# Patient Record
Sex: Male | Born: 1935 | Race: White | Hispanic: No | Smoking: Former smoker
Health system: Southern US, Community
[De-identification: ages and names within clinical notes are randomized; demographics above are authoritative.]

## PROBLEM LIST (undated history)

## (undated) DIAGNOSIS — K429 Umbilical hernia without obstruction or gangrene: Secondary | ICD-10-CM

## (undated) DIAGNOSIS — I10 Essential (primary) hypertension: Secondary | ICD-10-CM

## (undated) DIAGNOSIS — H409 Unspecified glaucoma: Secondary | ICD-10-CM

## (undated) DIAGNOSIS — H9190 Unspecified hearing loss, unspecified ear: Secondary | ICD-10-CM

## (undated) DIAGNOSIS — Z9889 Other specified postprocedural states: Secondary | ICD-10-CM

## (undated) DIAGNOSIS — J189 Pneumonia, unspecified organism: Secondary | ICD-10-CM

## (undated) DIAGNOSIS — I272 Pulmonary hypertension, unspecified: Secondary | ICD-10-CM

## (undated) DIAGNOSIS — I5033 Acute on chronic diastolic (congestive) heart failure: Secondary | ICD-10-CM

## (undated) DIAGNOSIS — E7800 Pure hypercholesterolemia, unspecified: Secondary | ICD-10-CM

## (undated) DIAGNOSIS — H269 Unspecified cataract: Secondary | ICD-10-CM

## (undated) DIAGNOSIS — R9431 Abnormal electrocardiogram [ECG] [EKG]: Secondary | ICD-10-CM

## (undated) DIAGNOSIS — Z8781 Personal history of (healed) traumatic fracture: Secondary | ICD-10-CM

## (undated) DIAGNOSIS — I2510 Atherosclerotic heart disease of native coronary artery without angina pectoris: Secondary | ICD-10-CM

## (undated) DIAGNOSIS — IMO0001 Reserved for inherently not codable concepts without codable children: Secondary | ICD-10-CM

## (undated) DIAGNOSIS — I340 Nonrheumatic mitral (valve) insufficiency: Secondary | ICD-10-CM

## (undated) DIAGNOSIS — I071 Rheumatic tricuspid insufficiency: Secondary | ICD-10-CM

## (undated) DIAGNOSIS — R011 Cardiac murmur, unspecified: Secondary | ICD-10-CM

## (undated) DIAGNOSIS — R21 Rash and other nonspecific skin eruption: Secondary | ICD-10-CM

## (undated) DIAGNOSIS — Z951 Presence of aortocoronary bypass graft: Secondary | ICD-10-CM

## (undated) DIAGNOSIS — I5032 Chronic diastolic (congestive) heart failure: Secondary | ICD-10-CM

## (undated) DIAGNOSIS — I358 Other nonrheumatic aortic valve disorders: Secondary | ICD-10-CM

## (undated) HISTORY — DX: Umbilical hernia without obstruction or gangrene: K42.9

## (undated) HISTORY — DX: Pulmonary hypertension, unspecified: I27.20

## (undated) HISTORY — DX: Chronic diastolic (congestive) heart failure: I50.32

## (undated) HISTORY — DX: Other nonrheumatic aortic valve disorders: I35.8

## (undated) HISTORY — PX: CARDIAC CATHETERIZATION: SHX172

## (undated) HISTORY — DX: Abnormal electrocardiogram (ECG) (EKG): R94.31

## (undated) HISTORY — DX: Unspecified hearing loss, unspecified ear: H91.90

## (undated) HISTORY — PX: TONSILLECTOMY: SUR1361

## (undated) HISTORY — DX: Atherosclerotic heart disease of native coronary artery without angina pectoris: I25.10

## (undated) HISTORY — PX: COLONOSCOPY: SHX174

## (undated) HISTORY — DX: Rheumatic tricuspid insufficiency: I07.1

## (undated) HISTORY — DX: Acute on chronic diastolic (congestive) heart failure: I50.33

## (undated) HISTORY — DX: Pure hypercholesterolemia, unspecified: E78.00

## (undated) HISTORY — DX: Nonrheumatic mitral (valve) insufficiency: I34.0

## (undated) HISTORY — DX: Unspecified cataract: H26.9

## (undated) HISTORY — DX: Personal history of (healed) traumatic fracture: Z87.81

---

## 1965-06-09 DIAGNOSIS — J189 Pneumonia, unspecified organism: Secondary | ICD-10-CM

## 1965-06-09 HISTORY — DX: Pneumonia, unspecified organism: J18.9

## 1969-02-07 HISTORY — PX: FACIAL FRACTURE SURGERY: SHX1570

## 1976-06-09 DIAGNOSIS — Z8781 Personal history of (healed) traumatic fracture: Secondary | ICD-10-CM

## 1976-06-09 HISTORY — DX: Personal history of (healed) traumatic fracture: Z87.81

## 2012-03-01 ENCOUNTER — Encounter (INDEPENDENT_AMBULATORY_CARE_PROVIDER_SITE_OTHER): Payer: Self-pay | Admitting: General Surgery

## 2012-03-02 ENCOUNTER — Ambulatory Visit (INDEPENDENT_AMBULATORY_CARE_PROVIDER_SITE_OTHER): Payer: Medicare Other | Admitting: General Surgery

## 2012-03-03 ENCOUNTER — Encounter (INDEPENDENT_AMBULATORY_CARE_PROVIDER_SITE_OTHER): Payer: Self-pay | Admitting: General Surgery

## 2012-03-03 ENCOUNTER — Ambulatory Visit (INDEPENDENT_AMBULATORY_CARE_PROVIDER_SITE_OTHER): Payer: Medicare Other | Admitting: General Surgery

## 2012-03-03 VITALS — BP 144/84 | HR 67 | Temp 97.9°F | Resp 18 | Ht 72.0 in | Wt 209.8 lb

## 2012-03-03 DIAGNOSIS — M6200 Separation of muscle (nontraumatic), unspecified site: Secondary | ICD-10-CM

## 2012-03-03 DIAGNOSIS — M6208 Separation of muscle (nontraumatic), other site: Secondary | ICD-10-CM

## 2012-03-03 NOTE — Patient Instructions (Signed)
Your exam today reveals no evidence of hernia.  The bulge is caused by a condition called by  diastasis recti.  I am not certain whether this is causing the tightening of your muscles or not.  Use the abdominal binder for 2 or 3 weeks. If this relieves all of your symptoms give me a call and you will be referred to a Engineer, petroleum.  If it does not relieve your symptoms then there must be another cause that we have not identified. You would need to go back to see Dr. Antony Haste at that point.

## 2012-03-03 NOTE — Progress Notes (Addendum)
Patient ID: Trevor Aguilar, male   DOB: 1936/04/11, 76 y.o.   MRN: [id number]  Chief Complaint  Patient presents with  . New Evaluation    hernia    HPI Trevor Aguilar is a 76 y.o. male.  He is referred by Tomi Bamberger, NP  At [facility]  family medicine for evaluation of possible umbilical hernia.  The patient states he has had a bulge in his upper abdomen for about 5 years. It is no larger than it was 5 years ago. He does not have any pain or GI symptoms. He said he states that his muscles tighten up when he leans over from a standing position and he wonders if this bulge is causing that tightness.    He does not have any history of umbilical hernia.    He did have a right inguinal hernia repair many years ago but has no symptoms in the inguinal area. HPI  Past Medical History  Diagnosis Date  . Umbilical hernia   . Nonspecific abnormal electrocardiogram (ECG) (EKG)   . Hearing loss   . Bilateral cataracts     Past Surgical History  Procedure Date  . Tonsillectomy   . Facial fracture surgery     Family History  Problem Relation Age of Onset  . Cancer Mother     breast  . Alcohol abuse Father     Social History History  Substance Use Topics  . Smoking status: Never Smoker   . Smokeless tobacco: Former Neurosurgeon  . Alcohol Use: Yes     2 - 4 glasses of scotch daily    No Known Allergies  Current Outpatient Prescriptions  Medication Sig Dispense Refill  . aspirin 81 MG tablet Take 81 mg by mouth daily.      . calcium-vitamin D (OSCAL) 250-125 MG-UNIT per tablet Take 50,000 tablets by mouth once a week.      . fish oil-omega-3 fatty acids 1000 MG capsule Take 1 g by mouth daily.      . simvastatin (ZOCOR) 20 MG tablet         Review of Systems Review of Systems  Constitutional: Negative for fever, chills and unexpected weight change.  HENT: Negative for hearing loss, congestion, sore throat, trouble swallowing and voice change.   Eyes: Negative for visual disturbance.    Respiratory: Negative for cough and wheezing.   Cardiovascular: Positive for palpitations. Negative for chest pain and leg swelling.  Gastrointestinal: Positive for abdominal distention. Negative for nausea, vomiting, abdominal pain, diarrhea, constipation, blood in stool, anal bleeding and rectal pain.  Genitourinary: Negative for hematuria and difficulty urinating.  Musculoskeletal: Negative for arthralgias.  Skin: Negative for rash and wound.  Neurological: Negative for seizures, syncope, weakness and headaches.  Hematological: Negative for adenopathy. Does not bruise/bleed easily.  Psychiatric/Behavioral: Negative for confusion.    Blood pressure 144/84, pulse 67, temperature 97.9 F (36.6 C), temperature source Temporal, resp. rate 18, height 6' (1.829 m), weight 209 lb 12.8 oz (95.165 kg), SpO2 99.00%.  Physical Exam Physical Exam  Constitutional: He is oriented to person, place, and time. He appears well-developed and well-nourished. No distress.  HENT:  Head: Normocephalic.  Nose: Nose normal.  Mouth/Throat: No oropharyngeal exudate.  Eyes: Conjunctivae normal and EOM are normal. Pupils are equal, round, and reactive to light. Right eye exhibits no discharge. Left eye exhibits no discharge. No scleral icterus.  Neck: Normal range of motion. Neck supple. No JVD present. No tracheal deviation present. No thyromegaly present.  Cardiovascular:  Normal rate, regular rhythm, normal heart sounds and intact distal pulses.   No murmur heard. Pulmonary/Chest: Effort normal and breath sounds normal. No stridor. No respiratory distress. He has no wheezes. He has no rales. He exhibits no tenderness.  Abdominal: Soft. Bowel sounds are normal. He exhibits no distension and no mass. There is no tenderness. There is no rebound and no guarding.       : Diastases recti noted. Bulge when he lifts his head or does a sit up from the supine position. No bulge  in the upper abdomen when standing. The  abdomen is not distended. There is no mass. There is no umbilical or inguinal hernia. There is no tenderness.  Musculoskeletal: Normal range of motion. He exhibits no edema and no tenderness.  Lymphadenopathy:    He has no cervical adenopathy.  Neurological: He is alert and oriented to person, place, and time. He has normal reflexes. Coordination normal.  Skin: Skin is warm and dry. No rash noted. He is not diaphoretic. No erythema. No pallor.  Psychiatric: He has a normal mood and affect. His behavior is normal. Judgment and thought content normal.    Data Reviewed Office notes from [facility] family [facility]  Assessment    Diastases recti.  Clinically there is no evidence of abdominal wall hernia    Plan    He was advised of my clinical impression. I told him that the diastases recti did not require surgical intervention. I told him that I was uncertain as to the cause of his muscle tightness.  I gave him a prescription for a Velcro binder. If this resolves his symptoms then perhaps he should be referred to a plastic surgeon for abdominal wall reconstruction. If it does not resolve his symptoms then they remain unexplained and he should return to Dr. Antony Haste to search for other reasons of abdominal wall tightness or spasm.       Angelia Mould. Derrell Lolling, M.D., [facility] General and Minimally invasive Surgery Breast and Colorectal Surgery Office:   [phone] Pager:   [phone]  03/03/2012, 3:12 PM

## 2012-05-14 HISTORY — PX: EYE SURGERY: SHX253

## 2012-05-25 ENCOUNTER — Encounter (INDEPENDENT_AMBULATORY_CARE_PROVIDER_SITE_OTHER): Payer: Self-pay

## 2012-07-23 ENCOUNTER — Emergency Department (HOSPITAL_COMMUNITY): Payer: Medicare Other

## 2012-07-23 ENCOUNTER — Emergency Department (HOSPITAL_COMMUNITY)
Admission: EM | Admit: 2012-07-23 | Discharge: 2012-07-24 | Disposition: A | Discharge status: Home / Self Care | Payer: Medicare Other | Attending: Emergency Medicine | Admitting: Emergency Medicine

## 2012-07-23 ENCOUNTER — Encounter (HOSPITAL_COMMUNITY): Payer: Self-pay | Admitting: Emergency Medicine

## 2012-07-23 DIAGNOSIS — Z7982 Long term (current) use of aspirin: Secondary | ICD-10-CM | POA: Insufficient documentation

## 2012-07-23 DIAGNOSIS — Z8669 Personal history of other diseases of the nervous system and sense organs: Secondary | ICD-10-CM | POA: Insufficient documentation

## 2012-07-23 DIAGNOSIS — R300 Dysuria: Secondary | ICD-10-CM | POA: Insufficient documentation

## 2012-07-23 DIAGNOSIS — Z8719 Personal history of other diseases of the digestive system: Secondary | ICD-10-CM | POA: Insufficient documentation

## 2012-07-23 DIAGNOSIS — K611 Rectal abscess: Secondary | ICD-10-CM

## 2012-07-23 DIAGNOSIS — R339 Retention of urine, unspecified: Secondary | ICD-10-CM

## 2012-07-23 DIAGNOSIS — E7800 Pure hypercholesterolemia, unspecified: Secondary | ICD-10-CM | POA: Insufficient documentation

## 2012-07-23 DIAGNOSIS — K612 Anorectal abscess: Secondary | ICD-10-CM | POA: Insufficient documentation

## 2012-07-23 DIAGNOSIS — K644 Residual hemorrhoidal skin tags: Secondary | ICD-10-CM | POA: Insufficient documentation

## 2012-07-23 DIAGNOSIS — R350 Frequency of micturition: Secondary | ICD-10-CM | POA: Insufficient documentation

## 2012-07-23 DIAGNOSIS — H269 Unspecified cataract: Secondary | ICD-10-CM | POA: Insufficient documentation

## 2012-07-23 DIAGNOSIS — Z79899 Other long term (current) drug therapy: Secondary | ICD-10-CM | POA: Insufficient documentation

## 2012-07-23 DIAGNOSIS — R197 Diarrhea, unspecified: Secondary | ICD-10-CM | POA: Insufficient documentation

## 2012-07-23 HISTORY — DX: Pure hypercholesterolemia, unspecified: E78.00

## 2012-07-23 LAB — COMPREHENSIVE METABOLIC PANEL
ALT: 47 U/L (ref 0–53)
AST: 34 U/L (ref 0–37)
Albumin: 2.9 g/dL — ABNORMAL LOW (ref 3.5–5.2)
Alkaline Phosphatase: 103 U/L (ref 39–117)
Potassium: 3.7 mEq/L (ref 3.5–5.1)
Sodium: 135 mEq/L (ref 135–145)
Total Protein: 6.8 g/dL (ref 6.0–8.3)

## 2012-07-23 LAB — CBC WITH DIFFERENTIAL/PLATELET
Basophils Absolute: 0 10*3/uL (ref 0.0–0.1)
Basophils Relative: 0 % (ref 0–1)
Eosinophils Absolute: 0.4 10*3/uL (ref 0.0–0.7)
Eosinophils Relative: 2 % (ref 0–5)
HCT: 37.7 % — ABNORMAL LOW (ref 39.0–52.0)
Hemoglobin: 13.2 g/dL (ref 13.0–17.0)
Lymphocytes Relative: 12 % (ref 12–46)
Lymphs Abs: 2 K/uL (ref 0.7–4.0)
MCH: 33.2 pg (ref 26.0–34.0)
MCHC: 35 g/dL (ref 30.0–36.0)
MCV: 94.7 fL (ref 78.0–100.0)
Monocytes Absolute: 2 K/uL — ABNORMAL HIGH (ref 0.1–1.0)
Monocytes Relative: 12 % (ref 3–12)
Neutro Abs: 12.3 K/uL — ABNORMAL HIGH (ref 1.7–7.7)
Neutrophils Relative %: 74 % (ref 43–77)
Platelets: 316 10*3/uL (ref 150–400)
RBC: 3.98 MIL/uL — ABNORMAL LOW (ref 4.22–5.81)
RDW: 12.9 % (ref 11.5–15.5)
WBC: 16.7 K/uL — ABNORMAL HIGH (ref 4.0–10.5)

## 2012-07-23 LAB — URINALYSIS, ROUTINE W REFLEX MICROSCOPIC
Leukocytes, UA: NEGATIVE
Protein, ur: NEGATIVE mg/dL
Urobilinogen, UA: 1 mg/dL (ref 0.0–1.0)

## 2012-07-23 LAB — COMPREHENSIVE METABOLIC PANEL WITH GFR
BUN: 11 mg/dL (ref 6–23)
CO2: 23 meq/L (ref 19–32)
Calcium: 8.9 mg/dL (ref 8.4–10.5)
Chloride: 100 meq/L (ref 96–112)
Creatinine, Ser: 0.82 mg/dL (ref 0.50–1.35)
GFR calc Af Amer: >90 mL/min (ref >90)
GFR calc non Af Amer: 84 mL/min — ABNORMAL LOW (ref >90)
Glucose, Bld: 122 mg/dL — ABNORMAL HIGH (ref 70–99)
Total Bilirubin: 0.8 mg/dL (ref 0.3–1.2)

## 2012-07-23 LAB — OCCULT BLOOD, POC DEVICE: Fecal Occult Bld: POSITIVE — AB

## 2012-07-23 MED ORDER — AMOXICILLIN-POT CLAVULANATE 500-125 MG PO TABS: 1.0000 | ORAL_TABLET | Freq: Three times a day (TID) | ORAL | Status: AC

## 2012-07-23 MED ORDER — IOHEXOL 300 MG/ML  SOLN
20.0000 mL | INTRAMUSCULAR | Status: AC
  Administered 2012-07-23: 50 mL via ORAL

## 2012-07-23 MED ORDER — IOHEXOL 300 MG/ML  SOLN
100.0000 mL | Freq: Once | INTRAMUSCULAR | Status: AC | PRN
  Administered 2012-07-23: 100 mL via INTRAVENOUS

## 2012-07-23 NOTE — ED Notes (Signed)
Pt back in bed w rails up; placed back on spo2 and BP cuff.  Family at bedside.

## 2012-07-23 NOTE — ED Provider Notes (Signed)
I saw and evaluated the patient, reviewed the resident's note and I agree with the findings and plan.  Patient with normal vital signs, afebrile. Abdomen is soft without rebound or guarding. Per resident evaluation, normal rectal tone no obvious objective subtle anesthesia, no obvious prostatitis. Given difficulty with urination and occasionally with possible mild bowel incontinence, will obtain a abdomen and pelvis CT to rule out sigmoid diverticulitis or subtle prostatitis. Patient does have a a mild leukocytosis.  Disposition pending results of CT scan.  Gavin Pound. Idaly Verret, MD 07/25/12 1610

## 2012-07-23 NOTE — ED Notes (Addendum)
Alert, NAD, calm, interactive, resps e/u, speaking in clear complete sentences, (denies: pain, sob or nausea), reports: just went to the b/r and "everything came out ok, no blood"  (BM and urine).

## 2012-07-23 NOTE — ED Notes (Signed)
C/o not being able to control bladder & bowels intermittently with position changes x 4 days. Also reports decreased appetite & having to get up every 10-15 minutes at night to void & only dribbling. States feels like he is not emptying his bladder. Denies n/v/d, last normal BM this morning. Denies dysuria, hematuria, abd pain

## 2012-07-23 NOTE — ED Provider Notes (Signed)
History     CSN: [id number]  Arrival date & time 07/23/12  1414   First MD Initiated Contact with Patient 07/23/12 1708      Chief Complaint  Patient presents with  . Rectal Bleeding    (Consider location/radiation/quality/duration/timing/severity/associated sxs/prior treatment) Patient is a 77 y.o. male presenting with male genitourinary complaint. The history is provided by the patient and the spouse. No language interpreter was used.  Male GU Problem Primary symptoms comment: sensation of incomplete empyting, frequency only at night for 2-3 days. This is a new problem. The current episode started 2 days ago. Episode frequency: nightly. The problem has been resolved. The symptoms occur during urination and during defecation. Associated symptoms include frequency and diarrhea (with several episodes of fecal incontence when changing position). Pertinent negatives include no anorexia, no diaphoresis, no nausea, no vomiting, no abdominal pain, no abdominal swelling and no constipation. There has been no fever. He has tried nothing for the symptoms. The treatment provided no relief. Sexual activity: non-contributory. Partner displays symptoms of an STD: no.    Past Medical History  Diagnosis Date  . Umbilical hernia   . Nonspecific abnormal electrocardiogram (ECG) (EKG)   . Hearing loss   . Bilateral cataracts   . High cholesterol     Past Surgical History  Procedure Laterality Date  . Tonsillectomy    . Facial fracture surgery      Family History  Problem Relation Age of Onset  . Cancer Mother     breast  . Alcohol abuse Father     History  Substance Use Topics  . Smoking status: Never Smoker   . Smokeless tobacco: Former Neurosurgeon  . Alcohol Use: Yes     Comment: 2 - 4 glasses of scotch daily      Review of Systems  Constitutional: Negative for fever, chills, diaphoresis, activity change, appetite change and fatigue.  HENT: Negative for congestion, sore throat, facial  swelling, rhinorrhea, drooling, neck pain and voice change.   Respiratory: Negative for shortness of breath and stridor.   Gastrointestinal: Positive for diarrhea (with several episodes of fecal incontence when changing position). Negative for nausea, vomiting, abdominal pain, constipation, abdominal distention and anorexia.  Endocrine: Negative for polydipsia and polyuria.  Genitourinary: Positive for frequency and difficulty urinating. Negative for urgency, hematuria, flank pain and decreased urine volume.  Musculoskeletal: Negative for back pain and gait problem.  Skin: Negative for color change and wound.  Neurological: Negative for facial asymmetry, weakness, numbness and headaches.  Hematological: Does not bruise/bleed easily.  Psychiatric/Behavioral: Negative for confusion and agitation.    Allergies  Review of patient's allergies indicates no known allergies.  Home Medications   Current Outpatient Rx  Name  Route  Sig  Dispense  Refill  . aspirin 81 MG tablet   Oral   Take 81 mg by mouth daily.         . Brinzolamide-Brimonidine (SIMBRINZA) 1-0.2 % SUSP   Ophthalmic   Apply 1 drop to eye 2 (two) times daily.         . diphenhydrAMINE (BENADRYL) 25 MG tablet   Oral   Take 25 mg by mouth at bedtime as needed for itching or sleep.         . fish oil-omega-3 fatty acids 1000 MG capsule   Oral   Take 1 g by mouth daily.         . naproxen sodium (ANAPROX) 220 MG tablet   Oral   Take 440  mg by mouth at bedtime as needed (for pain).         . simvastatin (ZOCOR) 20 MG tablet   Oral   Take 20 mg by mouth daily.          . Travoprost, BAK Free, (TRAVATAN) 0.004 % SOLN ophthalmic solution   Both Eyes   Place 1 drop into both eyes at bedtime.         Marland Kitchen amoxicillin-clavulanate (AUGMENTIN) 500-125 MG per tablet   Oral   Take 1 tablet (500 mg total) by mouth every 8 (eight) hours.   21 tablet   0     BP 122/67  Pulse 62  Temp(Src) 98.5 F (36.9 C)  (Oral)  Resp 18  SpO2 96%  Physical Exam  Constitutional: He is oriented to person, place, and time. He appears well-developed and well-nourished. No distress.  HENT:  Head: Normocephalic and atraumatic.  Mouth/Throat: No oropharyngeal exudate.  Eyes: Pupils are equal, round, and reactive to light.  Neck: Normal range of motion. Neck supple.  Cardiovascular: Normal rate, regular rhythm and normal heart sounds.  Exam reveals no gallop and no friction rub.   No murmur heard. Pulmonary/Chest: Effort normal and breath sounds normal. No respiratory distress. He has no wheezes. He has no rales.  Abdominal: Soft. Bowel sounds are normal. He exhibits no distension and no mass. There is no tenderness. There is no rebound and no guarding.  Genitourinary: Prostate normal. Rectal exam shows external hemorrhoid. Rectal exam shows no tenderness. Guaiac positive stool. Prostate is not enlarged and not tender.  Musculoskeletal: Normal range of motion. He exhibits no edema and no tenderness.  Neurological: He is alert and oriented to person, place, and time.  Skin: Skin is warm and dry.  Psychiatric: He has a normal mood and affect.    ED Course  Procedures (including critical care time)  Labs Reviewed  CBC WITH DIFFERENTIAL - Abnormal; Notable for the following:    WBC 16.7 (*)    RBC 3.98 (*)    HCT 37.7 (*)    Neutro Abs 12.3 (*)    Monocytes Absolute 2.0 (*)    All other components within normal limits  COMPREHENSIVE METABOLIC PANEL - Abnormal; Notable for the following:    Glucose, Bld 122 (*)    Albumin 2.9 (*)    GFR calc non Af Amer 84 (*)    All other components within normal limits  URINALYSIS, ROUTINE W REFLEX MICROSCOPIC - Abnormal; Notable for the following:    Bilirubin Urine SMALL (*)    All other components within normal limits  OCCULT BLOOD, POC DEVICE - Abnormal; Notable for the following:    Fecal Occult Bld POSITIVE (*)    All other components within normal limits   Ct  Abdomen Pelvis W Contrast  07/23/2012  *RADIOLOGY REPORT*  Clinical Data: Elevated white blood cell count  CT ABDOMEN AND PELVIS WITH CONTRAST  Technique:  Multidetector CT imaging of the abdomen and pelvis was performed following the standard protocol during bolus administration of intravenous contrast.  Contrast: OMNIPAQUE IOHEXOL 300 MG/ML  SOLN  Comparison: None.  Findings: Lung bases:  Scattered tiny nodules are identified within the lung bases.  Index nodule in the left lower lobe measures 6 mm, image 15.  The  Abdomen/pelvis:  Focal area of low attenuation within the left hepatic lobe measures 7 mm, image 21.  Within the right hepatic lobe there is a low attenuation structure measuring 7 mm, image  27. The gallbladder is within normal limits.  There is no biliary dilatation.  The pancreas is unremarkable.  Normal appearance of the spleen.  Bilateral adrenal glands are negative.  Mild bilateral and symmetric perinephric fat stranding.  No renal calculi or hydronephrosis.  No focal liver abnormalities identified. There is moderate distention of the urinary bladder. During the there is diffuse low attenuation within the prostate gland.  The margins of the prostate gland are somewhat indistinct.  Calcified atherosclerotic change affects the abdominal aorta.  There is ectasia of the infrarenal abdominal aorta which measures up to 3 cm in AP dimension, image 38.  No upper abdominal or pelvic adenopathy noted.  No inguinal adenopathy.  The stomach and the small bowel loops have a normal course and caliber.  No evidence for small bowel obstruction.  The appendix is visualized and appears normal.  Multiple sigmoid diverticula identified.  There is no evidence for acute diverticulitis.  There are two perirectal/perianal fluid collections identified within the lower pelvis.  Along the left lateral side of the lower rectum there is a fluid collection measuring 3.7 x 2.6 x 3.0 cm.  More caudally and posteriorly  there is a fluid collection which measures 4.38 x 1.2 x 1.7 cm.  The lateral fluid collection appears to approach the posterior aspect of the prostate gland and may account for the patient's urinary symptoms.  Bones/Musculoskeletal: Multilevel degenerative disc disease noted within the lumbar spine.  There is a hemangioma within L2 vertebra.  IMPRESSION:  1.  There are at least two perirectal fluid collections identified within the lower pelvis which may account for the patient's urinary and bowel symptoms. Findings are concerning for small abscesses.  2.  Moderate distention of the urinary bladder. 3.  Small pulmonary nodules within the lung bases.  The largest measures 6 mm. If the patient is at high risk for bronchogenic carcinoma, follow-up chest CT at 6-12 months is recommended.  If the patient is at low risk for bronchogenic carcinoma, follow-up chest CT at 12 months is recommended.  This recommendation follows the consensus statement: Guidelines for Management of Small Pulmonary Nodules Detected on CT Scans: A Statement from the Fleischner Society as published in Radiology 2005; 237:395-400.   Original Report Authenticated By: Signa Kell, M.D.      1. Perirectal abscess   2. Urinary retention       MDM  6:10PM 1:22 AM Pt is a 77 y.o. male with pertinent PMHX of HLD who presents with intermittent chills about 2 weeks ago, now w/ 2-3 days of sensation of incomplete bladder emptying at night as well as several episodes of small amount of stool incontinence when getting out of bed, in particular.  No known fever in past several day, no dysuria, hematuria, blood in stool.  Has had an painful hemorrhoid without known bleeding. VSS, pt in NAD, afebrile.  Rectal w/ external hemorrhoid, non-tender prostate.  UA does not appear infected.  Pt found to have elevated WBC count and CT ab/pelvis added.   20:05 PM Pt found to have two small perirectal fluid collection in pelvis concerning for abscess  formation as well as distended bladder. Have spoken to general surgery about pt who will review CT, but does not believe pt currently in need of emergent drainage.  PO Augmentin recommended as well as gen surg acute clinic f/u next week.  Have spoken to pt about placing foley for likely urinary retention, but pt feels he is currently completely emptying and does  not want foley.  Will d/c home w/ return precautions for new or worsening symptoms including fever, inability to urinate.   Labs and imaging considered in decision making, reviewed by myself.  Imaging interpreted by radiology. Pt care discussed with my attending, Dr. Oletta Lamas.   1. Perirectal abscess   2. Urinary retention       Toy Cookey, MD 07/24/12 0126

## 2012-07-23 NOTE — ED Notes (Signed)
Pt walked to [facility] w hospital socks on.  Pt walked with stable gait.

## 2012-07-23 NOTE — ED Notes (Signed)
Onset 2-3 weeks ago possible hemorrhoids. Within past week twice sudden onset shivering and having problems urinating and bowel movements. Last two days painful sitting and only urinating a small amount.

## 2012-07-23 NOTE — ED Notes (Signed)
Contrast finished. CT aware. Preparing to place NSL. Pt in b/r.

## 2012-07-23 NOTE — ED Notes (Signed)
Pt back from CT, no changes.

## 2012-07-27 ENCOUNTER — Other Ambulatory Visit (INDEPENDENT_AMBULATORY_CARE_PROVIDER_SITE_OTHER): Payer: Self-pay | Admitting: General Surgery

## 2012-07-27 ENCOUNTER — Encounter (INDEPENDENT_AMBULATORY_CARE_PROVIDER_SITE_OTHER): Payer: Self-pay | Admitting: General Surgery

## 2012-07-27 ENCOUNTER — Ambulatory Visit (INDEPENDENT_AMBULATORY_CARE_PROVIDER_SITE_OTHER): Payer: Medicare Other | Admitting: General Surgery

## 2012-07-27 VITALS — BP 124/80 | HR 60 | Temp 97.2°F | Resp 20 | Ht 74.0 in | Wt 205.0 lb

## 2012-07-27 DIAGNOSIS — K603 Anal fistula: Secondary | ICD-10-CM

## 2012-07-27 DIAGNOSIS — K612 Anorectal abscess: Secondary | ICD-10-CM

## 2012-07-27 DIAGNOSIS — K611 Rectal abscess: Secondary | ICD-10-CM

## 2012-07-27 MED ORDER — METRONIDAZOLE 500 MG PO TABS: 500.0000 mg | ORAL_TABLET | Freq: Three times a day (TID) | ORAL | Status: DC

## 2012-07-27 MED ORDER — CIPROFLOXACIN HCL 500 MG PO TABS: 500.0000 mg | ORAL_TABLET | Freq: Two times a day (BID) | ORAL | Status: AC

## 2012-07-27 NOTE — Patient Instructions (Signed)
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 This may have caused your problems with urination.  Examination today does not reveal any evidence of infection or abscess but I do see the fistula opening externally on the left side  I have called in a 2 week supply of the antibiotics Cipro and Flagyl to your pharmacy and our like you to start taking his antibiotics today and stop taking the Augmentin.  Be sure to drink lots and lots of water.  Return to see [place] in 4 weeks and we will get a blood count at that time. If you get sick with fever or chills or pain call us  immediately.

## 2012-07-27 NOTE — Progress Notes (Signed)
Patient ID: Trevor Aguilar, male   DOB: 05-25-1936, 77 y.o.   MRN: [id number] History: This patient has seen [place] previously for a diastases recti. He is now sent back to [place] for possible rectal abscess.  The patient has no prior history of any rectal disease whatsoever.  2 or 3 weeks ago he had an episode of chills during the [place] Bowl and that went away. He went to the emergency room 5 days ago, February 14 with urinary retention. He is found to have a white count of 16,000. His rectal exam was reportedly normal. He was not having any rectal pain. No chills. No bleeding. No abdominal discomfort. He was having trouble coordinating his urination and stool evacuation.  CT scan showed a 3.7 cm fluid collection on the left lateral wall rectal wall and a 4.8 cm fluid collection posteriorly and more caudally, both which suggested intramural abscesses. He was placed on Augmentin and discharged home.  He's feeling better. No fever or chills. Eating well. Having bowel movements. Voiding but is still a little bit slow. He denies any drainage from his perianal skin  Incidentally he had a recent cardiac workup which was negative. He doesn't know who did this but sounds like [place]  [facility]. He's followed by Antony Haste at [place]  on [address].  Review of systems: 10 system review of systems are negative except as described above  Exam: Patient looks well. No distress. Alert. Neck: No adenopathy or mass Lungs: To auscultation bilaterally Heart: Regular rate and rhythm no murmur no ectopy Abdomen soft and nontender. No mass. No hernia. Diastases recti. No inguinal mass Rectal he has a small opening in the perianal skin on the left side. I was able to pass a probe posteriorly and it went fairly deep did not go superficial at all. There was no cellulitis. I did not see any perianal or perirectal abscess. Digital rectal exam and anoscopy revealed no mass or significant tenderness except some internal  hemorrhoids but I did not see an intramural abscess or feel one anywhere.  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 This may have caused the intramural fluid collections perhaps some type of horseshoe or deep intramural abscess which clinically and symptomatically is resolving. The does not appear to be a indication for urgent surgical intervention 6 mm pulmonary nodules. Followup in 6-12 months is recommended by radiology.  Plan: I discussed the CT findings with him and discussed my impression I will place him on Cipro and Flagyl for 14 days Return to see me in 3-4 weeks at which time we will get a CBC. If he is still symptomatic will get another CT scan. We will reexamine the fistula tract at that time. Followup CT chest in 6-12 months through his primary care physician.   Angelia Mould. Derrell Lolling, M.D., [facility] General and Minimally invasive Surgery Breast and Colorectal Surgery Office:   [phone] Pager:   [phone]

## 2012-08-02 ENCOUNTER — Telehealth (INDEPENDENT_AMBULATORY_CARE_PROVIDER_SITE_OTHER): Payer: Self-pay

## 2012-08-02 LAB — CBC
MCHC: 32.8 g/dL (ref 30.0–36.0)
Platelets: 380 10*3/uL (ref 150–400)
RDW: 13 % (ref 11.5–15.5)

## 2012-08-02 NOTE — Telephone Encounter (Signed)
[facility] [employer] calling in with STAT Lab results.  Lab results forwarded to [facility] to review with Dr. Derrell Lolling.

## 2012-08-23 ENCOUNTER — Encounter (INDEPENDENT_AMBULATORY_CARE_PROVIDER_SITE_OTHER): Payer: Medicare Other | Admitting: General Surgery

## 2014-02-23 ENCOUNTER — Other Ambulatory Visit: Payer: Self-pay | Admitting: Ophthalmology

## 2014-02-23 MED ORDER — TETRACAINE HCL 0.5 % OP SOLN: 1.0000 [drp] | OPHTHALMIC | Status: DC

## 2014-02-23 NOTE — H&P (Signed)
History & Physical:   DATE:   02-21-14  NAME:  Trevor Aguilar, Trevor Aguilar     [id number]       HISTORY OF PRESENT ILLNESS: Chief Eye Complaints glaucoma blurry vision  Va seems to be getting worse.   HPI: EYES: Reports symptoms of   poor appetite diarrhea associated w Neptazane  film over OS this AM upon awakening      LOCATION:   BOTH EYES        QUALITY/COURSE:   Reports condition is changing.        INTENSITY/SEVERITY:    Reports measurement ( or degree) as severe .      DURATION:   Reports the general length of symptoms to be   ONSET/TIMING:   Reports occurrence as   CONTEXT/WHEN:   Reports usually associated with   MODIFIERS/TREATMENTS:  Improved by              PSYCH/Psychiatric:    Is the pt oriented to time,yes place, person? yes Mood normal    ACTIVE PROBLEMS: Glaucoma associated with ocular inflammations   ICD9: 365.62  Onset: 09/08/2013 13:49  ICD10:  IOP improved  Neptazane  qd  Subacute iritis   ICD9: 364.00  Onset: 09/08/2013 14:09   resolved   ICD10:  OU Meibomian gland dysfunction   ICD9: 375.15 Onset: 10/14/2013 09:41 ICD10: H02.89  Dry eye syndrome   ICD9: 375.15  Onset: 09/08/2013 12:40  ICD10: X91.478  Nuclear sclerosis   ICD10:   ICD9: 366.16  Onset: 02/21/2014 12:52  OS   Pseudophakia   ICD9: 446.0 Onset: 09/08/2013 12:39 ICD10: Z96.1 OD    Visual floaters   ICD9: 379.24  Onset: 09/08/2013 12:39  ICD10: G95.621    Discussed no potential for VA improvement OD ,  phaco emulsion cataract extraction OS  SURGERIES: Cataract Surgery Dec 2013  posterior chamber w/  intraocular lens implant  OD  MEDICATIONS: Prolensa: Strength-  SIG-    Ciloxan (Ciprofloxacin) Solution: 0.3% solution SIG-  1 drop in affected eye twice a day   Lotemax: 0.5% gel SIG-  1 drop in OU QD   Simbrinza: 0.2%-1% suspension SIG-  1 gtt in each affected eye 3 times a day for 30 days   Neptazane: 50 mg tablet SIG-  2 tab(s) orally 2 times a day for 30 days   Restasis:  0.05% emulsion SIG-  1 gtt BID OU  REVIEW OF SYSTEMS: ROS:   GEN- Constitutional: Negative general-constitutional systems review.       TOBACCO: Never smoker   ICD9: V13.89 Onset: 09/08/2013 12:33  Tobacco use:     Tobacco cessation:          Smoker Status:   Never smoker   ICD9: V13.89 Onset: 09/08/2013 12:31  SOCIAL HISTORY: Starter Pick List - Social History Married Retired IT trainer  FAMILY HISTORY: Family History - 1st Degree Relatives:  Mother dead.  ALLERGIES: Travoprost:  redness .   Beta blockers, causes decreased heart rate   Trava  PHYSICAL EXAMINATION: VS: BMI: 27.9.  BP: 167/71.  H: 72.00 in.  P: 34 /min.  RR: 20 /min.  W: 205lbs 0oz.  low pulse rate    Va   OD:cc CF ' PH 20/NI OS:cc 20/60  PH   EYEGLASSES: 09/2012 OD:Pl +0.50x052 OS:pl +0.75x150 ADD:+2.50  MR   OD OS ADD  K's OD: 40.50  41.25 OS: 40.50 40.75  VF:   OD:   Constricted OS:    constricted                                             Motility: orthophoria and full  PUPILS:  sluggish reaction each eye  EYELIDS & OCULAR ADNEXA: inferior ectropion + clogged meibomian glands  each eye  SLE: Conjunctiva: +1 injection each eye with slight inferior chemosis  Cornea: irregular tear film with plus to to +2-3 staining OD, +1-2 staining OS Endothelial KP both eyes    anterior chamber  OD, deep & quiet, OS, deep and quiet  Iris: Gray no defects detected each eye  Lens: OD:  posterior chamber  intraocular lens implant with pigment   OS: Plus to to 3 nuclear sclerosis  Vitreous:  CCT:  Ta   in mmHg    OD 32        OS 32 without Simbrinza  Time 02/21/2014 12:15    Gonio: Difficult view angle appears to be open to scleral spur 360 OD OS angle open 360 to scleral spur   Dilation: not dilated secondary to elevated intraocular pressure  Fundus:  optic nerve   OD Temporal rim loss pale                                                  OS Temporal pallor,  90% cupping inferior rim loss   Macula:  OD   flat                                                 OS  Poor view appears to be flat  Vessels:  Periphery:  Computerized visual field test right eye reveals diffuse loss superior and inferior field with extension into fixation using a V stimulus  Left eye reveals an inferior arcuate defect an inferior nasal step.  There is a dense paracentral scotoma near fixation     Labs all  normal      Exam: GENERAL: Appearance: HEAD, EARS, NOSE AND THROAT: Ears-Nose (external) Inspection: Externally, nose and ears are normal in appearance and without scars, lesions, or nodules.      Hearing assessment shows no problems with normal conversation.      LUNGS and RESPIRATORY: Lung auscultation elicits no wheezing, rhonci, rales or rubs and with equal breath sounds.    Respiratory effort described as breathing is unlabored and chest movement is symmetrical.    HEART (Cardiovascular): Heart auscultation discovers regular rate and rhythm; no murmur, gallop or rub. Normal heart sounds.    ABDOMEN (Gastrointestinal): Mass/Tenderness Exam: Neither are present.     MUSCULOSKELETAL (BJE): Inspection-Palpation: No major bone, joint, tendon, or muscle changes.      NEUROLOGICAL: Alert and oriented. No major deficits of coordination or sensation.      PSYCHIATRIC: Insight and judgment appear  both to be intact and appropriate.    Mood and affect are described as normal mood and full affect.    SKIN: Skin Inspection: No rashes or lesions  ADMITTING DIAGNOSIS: Glaucoma associated with ocular  inflammations   ICD9: 365.62  Onset: 09/08/2013 13:49  Initial Date:   IOP improved BUT SEVERE SIDE EFFECTS ON  Neptazane  qd  Subacute iritis   ICD9: 364.00  Onset: 09/08/2013 14:09  Initial Date:   resolved    OU Meibomian gland dysfunction    Dry eye syndrome   ICD9: 375.15  Onset: 09/08/2013 12:40   Nuclear sclerosis   ICD10:   ICD9: 366.16  Onset: 02/21/2014 12:52   Initial Date:   OS   Pseudophakia   ICD9: 446.0 Onset: 09/08/2013 12:39  OD    Visual floaters   ICD9: 379.24  Onset: 09/08/2013 12:39     Discussed no potential for VA improvement OD ,  phaco emulsion cataract extraction OS  SURGICAL TREATMENT PLAN: Simbrinza each eye now Phacoemulsification combined with  trabeculectomy   left eye with mitomycin C  Risk and benefits of surgery have been reviewed with the patient and the patient agrees to proceed with the surgical procedure.   Decreased Neptazane1 pill BID  Actions:     Handouts: glaucoma , what is glaucoma?, What is a cataract?, New Handout, glaucoma treatment, Script Guide - Restasis.    ___________________________ Trevor Aguilar, Jr. Starter - Inactive Problems:    High Cholesterol

## 2014-02-28 ENCOUNTER — Encounter (HOSPITAL_COMMUNITY): Payer: Self-pay | Admitting: *Deleted

## 2014-02-28 ENCOUNTER — Encounter (HOSPITAL_COMMUNITY): Payer: Self-pay | Admitting: Pharmacy Technician

## 2014-02-28 MED ORDER — GATIFLOXACIN 0.5 % OP SOLN
1.0000 [drp] | OPHTHALMIC | Status: AC | PRN
  Administered 2014-03-01 (×3): 1 [drp] via OPHTHALMIC
  Filled 2014-02-28: qty 2.5

## 2014-02-28 MED ORDER — CYCLOPENTOLATE HCL 1 % OP SOLN
1.0000 [drp] | OPHTHALMIC | Status: AC
  Administered 2014-03-01 (×3): 1 [drp] via OPHTHALMIC
  Filled 2014-02-28: qty 2

## 2014-02-28 MED ORDER — KETOROLAC TROMETHAMINE 0.5 % OP SOLN
1.0000 [drp] | OPHTHALMIC | Status: AC
  Administered 2014-03-01 (×3): 1 [drp] via OPHTHALMIC
  Filled 2014-02-28: qty 5

## 2014-02-28 MED ORDER — PHENYLEPHRINE HCL 2.5 % OP SOLN
1.0000 [drp] | OPHTHALMIC | Status: AC
  Administered 2014-03-01 (×3): 1 [drp] via OPHTHALMIC
  Filled 2014-02-28: qty 2

## 2014-03-01 ENCOUNTER — Ambulatory Visit (HOSPITAL_COMMUNITY)
Admission: RE | Admit: 2014-03-01 | Discharge: 2014-03-01 | Disposition: A | Discharge status: Home / Self Care | Payer: Medicare Other | Source: Ambulatory Visit | Attending: Ophthalmology | Admitting: Ophthalmology

## 2014-03-01 ENCOUNTER — Encounter (HOSPITAL_COMMUNITY): Payer: Medicare Other | Admitting: Anesthesiology

## 2014-03-01 ENCOUNTER — Ambulatory Visit (HOSPITAL_COMMUNITY): Payer: Medicare Other | Admitting: Anesthesiology

## 2014-03-01 ENCOUNTER — Encounter (HOSPITAL_COMMUNITY): Payer: Self-pay | Admitting: *Deleted

## 2014-03-01 ENCOUNTER — Encounter (HOSPITAL_COMMUNITY)
Admission: RE | Disposition: A | Discharge status: Home / Self Care | Payer: Self-pay | Source: Ambulatory Visit | Attending: Ophthalmology

## 2014-03-01 DIAGNOSIS — Z961 Presence of intraocular lens: Secondary | ICD-10-CM | POA: Insufficient documentation

## 2014-03-01 DIAGNOSIS — H43399 Other vitreous opacities, unspecified eye: Secondary | ICD-10-CM | POA: Insufficient documentation

## 2014-03-01 DIAGNOSIS — H409 Unspecified glaucoma: Secondary | ICD-10-CM | POA: Insufficient documentation

## 2014-03-01 DIAGNOSIS — H04129 Dry eye syndrome of unspecified lacrimal gland: Secondary | ICD-10-CM | POA: Diagnosis not present

## 2014-03-01 DIAGNOSIS — H2000 Unspecified acute and subacute iridocyclitis: Secondary | ICD-10-CM | POA: Insufficient documentation

## 2014-03-01 DIAGNOSIS — H2510 Age-related nuclear cataract, unspecified eye: Secondary | ICD-10-CM | POA: Insufficient documentation

## 2014-03-01 DIAGNOSIS — H269 Unspecified cataract: Secondary | ICD-10-CM | POA: Diagnosis not present

## 2014-03-01 HISTORY — PX: CATARACT EXTRACTION W/PHACO: SHX586

## 2014-03-01 HISTORY — DX: Unspecified glaucoma: H40.9

## 2014-03-01 HISTORY — PX: TRABECULECTOMY: SHX107

## 2014-03-01 HISTORY — DX: Pneumonia, unspecified organism: J18.9

## 2014-03-01 LAB — CBC
HCT: 47.6 % (ref 39.0–52.0)
Hemoglobin: 15.5 g/dL (ref 13.0–17.0)
MCH: 32.8 pg (ref 26.0–34.0)
MCHC: 32.6 g/dL (ref 30.0–36.0)
MCV: 100.8 fL — AB (ref 78.0–100.0)
Platelets: 232 10*3/uL (ref 150–400)
RBC: 4.72 MIL/uL (ref 4.22–5.81)
RDW: 13.1 % (ref 11.5–15.5)
WBC: 7.7 10*3/uL (ref 4.0–10.5)

## 2014-03-01 SURGERY — TRABECULECTOMY
Anesthesia: Monitor Anesthesia Care | Site: Eye | Laterality: Left

## 2014-03-01 MED ORDER — TOBRAMYCIN 0.3 % OP OINT
TOPICAL_OINTMENT | OPHTHALMIC | Status: DC | PRN
  Administered 2014-03-01: 1 via OPHTHALMIC

## 2014-03-01 MED ORDER — TRIAMCINOLONE ACETONIDE 40 MG/ML IJ SUSP
INTRAMUSCULAR | Status: DC | PRN
  Administered 2014-03-01: .1 mL

## 2014-03-01 MED ORDER — SODIUM HYALURONATE 10 MG/ML IO SOLN
INTRAOCULAR | Status: AC
  Filled 2014-03-01: qty 0.85

## 2014-03-01 MED ORDER — PILOCARPINE HCL 4 % OP SOLN
OPHTHALMIC | Status: AC
  Filled 2014-03-01: qty 15

## 2014-03-01 MED ORDER — NA CHONDROIT SULF-NA HYALURON 40-30 MG/ML IO SOLN
INTRAOCULAR | Status: DC | PRN
  Administered 2014-03-01: 0.5 mL via INTRAOCULAR

## 2014-03-01 MED ORDER — LIDOCAINE HCL (CARDIAC) 20 MG/ML IV SOLN
INTRAVENOUS | Status: DC | PRN
  Administered 2014-03-01: 30 mg via INTRAVENOUS

## 2014-03-01 MED ORDER — TROPICAMIDE 1 % OP SOLN
OPHTHALMIC | Status: AC
  Filled 2014-03-01: qty 3

## 2014-03-01 MED ORDER — SODIUM HYALURONATE 10 MG/ML IO SOLN
INTRAOCULAR | Status: DC | PRN
  Administered 2014-03-01: 0.85 mL via INTRAOCULAR

## 2014-03-01 MED ORDER — PROPOFOL 10 MG/ML IV BOLUS
INTRAVENOUS | Status: AC
  Filled 2014-03-01: qty 20

## 2014-03-01 MED ORDER — LIDOCAINE HCL 2 % IJ SOLN
INTRAMUSCULAR | Status: AC
  Filled 2014-03-01: qty 20

## 2014-03-01 MED ORDER — TROPICAMIDE 1 % OP SOLN
1.0000 [drp] | OPHTHALMIC | Status: AC
  Filled 2014-03-01 (×2): qty 2

## 2014-03-01 MED ORDER — LIDOCAINE-EPINEPHRINE 2 %-1:100000 IJ SOLN
INTRAMUSCULAR | Status: AC
  Filled 2014-03-01: qty 1

## 2014-03-01 MED ORDER — NA CHONDROIT SULF-NA HYALURON 40-30 MG/ML IO SOLN
INTRAOCULAR | Status: AC
  Filled 2014-03-01: qty 0.5

## 2014-03-01 MED ORDER — FENTANYL CITRATE 0.05 MG/ML IJ SOLN: 25.0000 ug | INTRAMUSCULAR | Status: DC | PRN

## 2014-03-01 MED ORDER — FENTANYL CITRATE 0.05 MG/ML IJ SOLN
INTRAMUSCULAR | Status: DC | PRN
  Administered 2014-03-01 (×5): 50 ug via INTRAVENOUS

## 2014-03-01 MED ORDER — BSS IO SOLN
INTRAOCULAR | Status: AC
  Filled 2014-03-01: qty 500

## 2014-03-01 MED ORDER — FLUORESCEIN SODIUM 1 MG OP STRP
ORAL_STRIP | OPHTHALMIC | Status: DC | PRN
  Administered 2014-03-01: 1 via OPHTHALMIC

## 2014-03-01 MED ORDER — TROPICAMIDE 1 % OP SOLN
OPHTHALMIC | Status: DC | PRN
  Administered 2014-03-01 (×3): 1 [drp] via OPHTHALMIC

## 2014-03-01 MED ORDER — MITOMYCIN 0.2 MG OP KIT
0.2000 mg | PACK | OPHTHALMIC | Status: AC
  Administered 2014-03-01: 0.2 mg via OPHTHALMIC
  Filled 2014-03-01: qty 1

## 2014-03-01 MED ORDER — BSS IO SOLN
INTRAOCULAR | Status: DC | PRN
  Administered 2014-03-01: 09:00:00

## 2014-03-01 MED ORDER — FLUORESCEIN SODIUM 1 MG OP STRP
ORAL_STRIP | OPHTHALMIC | Status: AC
  Filled 2014-03-01: qty 1

## 2014-03-01 MED ORDER — 0.9 % SODIUM CHLORIDE (POUR BTL) OPTIME
TOPICAL | Status: DC | PRN
  Administered 2014-03-01: 1000 mL

## 2014-03-01 MED ORDER — PROPOFOL 10 MG/ML IV BOLUS
INTRAVENOUS | Status: DC | PRN
  Administered 2014-03-01: 70 mg via INTRAVENOUS

## 2014-03-01 MED ORDER — LIDOCAINE HCL (CARDIAC) 20 MG/ML IV SOLN
INTRAVENOUS | Status: AC
  Filled 2014-03-01: qty 5

## 2014-03-01 MED ORDER — BUPIVACAINE HCL (PF) 0.75 % IJ SOLN
INTRAMUSCULAR | Status: AC
  Filled 2014-03-01: qty 10

## 2014-03-01 MED ORDER — LACTATED RINGERS IV SOLN
INTRAVENOUS | Status: DC | PRN
  Administered 2014-03-01: 08:00:00 via INTRAVENOUS

## 2014-03-01 MED ORDER — LIDOCAINE-EPINEPHRINE 2 %-1:100000 IJ SOLN
INTRAMUSCULAR | Status: DC | PRN
  Administered 2014-03-01: 09:00:00 via RETROBULBAR

## 2014-03-01 MED ORDER — TETRACAINE HCL 0.5 % OP SOLN
OPHTHALMIC | Status: AC
  Filled 2014-03-01: qty 2

## 2014-03-01 MED ORDER — DROPERIDOL 2.5 MG/ML IJ SOLN
0.6250 mg | INTRAMUSCULAR | Status: DC | PRN
  Filled 2014-03-01: qty 0.25

## 2014-03-01 MED ORDER — DEXAMETHASONE SODIUM PHOSPHATE 10 MG/ML IJ SOLN
INTRAMUSCULAR | Status: AC
  Filled 2014-03-01: qty 1

## 2014-03-01 MED ORDER — FENTANYL CITRATE 0.05 MG/ML IJ SOLN
INTRAMUSCULAR | Status: AC
  Filled 2014-03-01: qty 5

## 2014-03-01 MED ORDER — ACETYLCHOLINE CHLORIDE 1:100 IO SOLR
INTRAOCULAR | Status: AC
Start: 2014-03-01 — End: 2014-03-01
  Filled 2014-03-01: qty 1

## 2014-03-01 MED ORDER — HYALURONIDASE HUMAN 150 UNIT/ML IJ SOLN
INTRAMUSCULAR | Status: AC
  Filled 2014-03-01: qty 1

## 2014-03-01 MED ORDER — EPINEPHRINE HCL 1 MG/ML IJ SOLN
INTRAMUSCULAR | Status: AC
  Filled 2014-03-01: qty 1

## 2014-03-01 MED ORDER — ACETYLCHOLINE CHLORIDE 1:100 IO SOLR
INTRAOCULAR | Status: DC | PRN
  Administered 2014-03-01: 20 mg via INTRAOCULAR

## 2014-03-01 MED ORDER — TOBRAMYCIN-DEXAMETHASONE 0.3-0.1 % OP OINT
TOPICAL_OINTMENT | OPHTHALMIC | Status: AC
  Filled 2014-03-01: qty 3.5

## 2014-03-01 MED ORDER — BSS IO SOLN
INTRAOCULAR | Status: DC | PRN
  Administered 2014-03-01: 500 mL via INTRAOCULAR

## 2014-03-01 MED ORDER — TRIAMCINOLONE ACETONIDE 40 MG/ML IJ SUSP
INTRAMUSCULAR | Status: AC
  Filled 2014-03-01: qty 5

## 2014-03-01 MED ORDER — BSS IO SOLN
INTRAOCULAR | Status: AC
  Filled 2014-03-01: qty 15

## 2014-03-01 MED ORDER — GENTAMICIN SULFATE 40 MG/ML IJ SOLN
INTRAMUSCULAR | Status: AC
  Filled 2014-03-01: qty 2

## 2014-03-01 SURGICAL SUPPLY — 46 items
APPLICATOR COTTON TIP 6IN STRL (MISCELLANEOUS) ×3 IMPLANT
APPLICATOR DR MATTHEWS STRL (MISCELLANEOUS) ×3 IMPLANT
BLADE 10 SAFETY STRL DISP (BLADE) ×3 IMPLANT
BLADE EYE CATARACT 19 1.4 BEAV (BLADE) ×3 IMPLANT
BLADE KERATOME 2.75 (BLADE) ×2 IMPLANT
BLADE KERATOME 2.75MM (BLADE) ×1
BLADE MINI RND TIP GREEN BEAV (BLADE) ×3 IMPLANT
BLADE STAB KNIFE 45DEG (BLADE) ×3 IMPLANT
CANISTER SUCTION 2500CC (MISCELLANEOUS) IMPLANT
CANNULA ANT/CHMB 27GA (MISCELLANEOUS) ×3 IMPLANT
CORDS BIPOLAR (ELECTRODE) ×3 IMPLANT
COVER MAYO STAND STRL (DRAPES) ×3 IMPLANT
DRAPE OPHTHALMIC 40X48 W POUCH (DRAPES) ×3 IMPLANT
DRAPE RETRACTOR (MISCELLANEOUS) ×3 IMPLANT
ERASER HMR WETFIELD 23G BP (MISCELLANEOUS) ×3 IMPLANT
GLOVE BIO SURGEON STRL SZ7 (GLOVE) ×6 IMPLANT
GLOVE BIO SURGEON STRL SZ8 (GLOVE) ×3 IMPLANT
GLOVE BIOGEL PI IND STRL 7.0 (GLOVE) ×1 IMPLANT
GLOVE BIOGEL PI INDICATOR 7.0 (GLOVE) ×2
GLOVE SURG SS PI 7.0 STRL IVOR (GLOVE) ×3 IMPLANT
GOWN STRL REIN XL XLG (GOWN DISPOSABLE) ×9 IMPLANT
KIT BASIN OR (CUSTOM PROCEDURE TRAY) ×3 IMPLANT
KIT ROOM TURNOVER OR (KITS) ×3 IMPLANT
KNIFE GRIESHABER SHARP 2.5MM (MISCELLANEOUS) ×3 IMPLANT
LENS IOL ACRSF IQ PC 20.0 (Intraocular Lens) ×1 IMPLANT
LENS IOL ACRYSOF IQ POST 20.0 (Intraocular Lens) ×3 IMPLANT
NEEDLE 25GX 5/8IN NON SAFETY (NEEDLE) ×6 IMPLANT
NEEDLE FILTER BLUNT 18X 1/2SAF (NEEDLE) ×2
NEEDLE FILTER BLUNT 18X1 1/2 (NEEDLE) ×1 IMPLANT
NEEDLE HYPO 30X.5 LL (NEEDLE) ×6 IMPLANT
NS IRRIG 1000ML POUR BTL (IV SOLUTION) ×3 IMPLANT
PACK CATARACT CUSTOM (CUSTOM PROCEDURE TRAY) ×3 IMPLANT
PAD ARMBOARD 7.5X6 YLW CONV (MISCELLANEOUS) ×6 IMPLANT
PAK PIK CVS CATARACT (OPHTHALMIC) ×3 IMPLANT
SPECIMEN JAR SMALL (MISCELLANEOUS) IMPLANT
SUT ETHILON 10 0 CS140 6 (SUTURE) ×3 IMPLANT
SUT ETHILON 9 0 BV100 4 (SUTURE) ×3 IMPLANT
SUT SILK 6 0 G 6 (SUTURE) ×3 IMPLANT
SUT VICRYL 9-0 (SUTURE) ×3 IMPLANT
SYR 50ML SLIP (SYRINGE) ×3 IMPLANT
SYR TB 1ML LUER SLIP (SYRINGE) ×3 IMPLANT
TIP ABS 45DEG FLARED 0.9MM (TIP) ×3 IMPLANT
TOWEL OR 17X24 6PK STRL BLUE (TOWEL DISPOSABLE) ×6 IMPLANT
TUBE CONNECTING 12'X1/4 (SUCTIONS) ×1
TUBE CONNECTING 12X1/4 (SUCTIONS) ×2 IMPLANT
WIPE INSTRUMENT VISIWIPE 73X73 (MISCELLANEOUS) ×3 IMPLANT

## 2014-03-01 NOTE — Transfer of Care (Signed)
Immediate Anesthesia Transfer of Care Note  Patient: Trevor Aguilar  Procedure(s) Performed: Procedure(s): TRABECULECTOMY (Left) CATARACT EXTRACTION PHACO AND INTRAOCULAR LENS PLACEMENT (IOC) (Left)  Patient Location: PACU  Anesthesia Type:MAC  Level of Consciousness: awake, alert  and oriented  Airway & Oxygen Therapy: Patient Spontanous Breathing  Post-op Assessment: Report given to PACU RN and Post -op Vital signs reviewed and stable  Post vital signs: Reviewed and stable  Complications: No apparent anesthesia complications

## 2014-03-01 NOTE — Op Note (Signed)
Preoperative diagnosis: Uncontrolled glaucoma and visually significant cataract left eye Postoperative diagnosis: Same Procedure: Phacoemulsification with intraocular lens implant and trabeculectomy with mitomycin-C with peripheral iridectomy left eye Anesthesia: 2% Xylocaine in a 50-50 mixture 0.75% Marcaine with ample Wydase with epinephrine Complications: None Assistant: Minglee Procedure: The patient was transported to the operating room where he was given a peribulbar block with the aforementioned local anesthetic agent. All in this the patient's face was prepped and draped in the usual sterile fashion. With the surgeon sitting temporally the operating microscope in position a Weck-Cel sponges used to fixate the globe and a 15 blade was used to enter through inferior temporal clear cornea. Viscoat was injected into the eye chamber it was noted that the pupil was not well dilated with the iris the tube the lens. Weck-Cel sponges used to fixate the globe and a 2.75 mm keratome blade was used in a stepwise fashion through temporal cornea to into the anterior chamber additional Viscoat was injected. Following this a spatula was passed beneath the iris to release adhesions and a Kuglen hook was then used to stretch the iris the pupil then dilated to greater than 6 mm. During the case the pupil did decrease to about 5 mm. Following this a bent 25-gauge needle was used to incise anterior capsule and a continuous tear curvilinear capsulorrhexis was formed the capsule forceps were used to remove the anterior capsule BSS was used to hydrodissect and hydrodelineate the nucleus the nucleus was rotated. The phacoemulsification unit was then used to sculpt the nucleus centrally the iris retracted down the snapper hook was used to push the iris forward and snap the nucleus into 3 quadrants all nuclear fragments were then removed from the eye and the posterior capsule remained intact. I/A was then used to strip cortical  fibers from the posterior capsule after cortical fibers had been removed. This was injected in the anterior chamber. The intraocular lens implant was examined and noted to have no defects the lens was an Alcon AcrySof SN 60 WF IQ lens 20.0 diopter SN #[id number] the lens was injected and unfolded and positioned with a Kuglen hook the subincisional cortex was removed with the I/A. Miochol was injected in the eye a single 10-0 nylon suture was placed to achieve watertight closure. At this point the operating microscope was rotated to the 12:00 position a 6-0 nylon suture was passed through clear cornea to infraducted the eye following this an incision was made in the superior nasal limbus using a Wescott scissors and Hoskins forceps and a fornix-based conjunctival flap was formed bleeding was controlled with cautery. The tenons  fibers were recessed using a Tooke blade. Following this mitomycin-C 0.4 mg per cc was placed onto Gelfoam sponges the sponges were placed beneath the conjunctiva and allowed to stay on the eye for 3 minutes the sponges were then removed and the eye was irrigated with 30 cc of balanced salt solution prior to applying the sponges a 45 blade was used to dissect a half thickness scleral flap. After irrigation the scleral flap was elevated and a 45 blade was used to enter the anterior chamber additional Provisc had been placed and the anterior chamber prior to applying the mitomycin-C. Descemet's punch was used to remove a 1 x 3 block of tissue beneath the scleral flap a basal peripheral iridectomy was formed BSS was injected scleral flap was sutured with 3 interrupted 10-0 nylon sutures following this the conjunctiva was then closed using a 9-0 Vicryl on  a BV 100 needle. BSS was injected in the anterior chamber a fluorescein strip was applied to the incision and there was no leakage noted that the conjunctiva nor from the cornea the anterior chamber was deep the lens implant was in good  position. A subconjunctival injection of Kenalog 4 mg is given in the inferior nasal subconjunctival space. Following this TobraDex ointment was applied to the eye a patch and Fox U. were placed and the patient returned to recovery area in stable condition. Chalmers Guest Junior M.D.

## 2014-03-01 NOTE — Interval H&P Note (Signed)
History and Physical Interval Note:  03/01/2014 8:23 AM  Trevor Aguilar  has presented today for surgery, with the diagnosis of Uncontrolled glaucoma and cataract left eye  The various methods of treatment have been discussed with the patient and family. After consideration of risks, benefits and other options for treatment, the patient has consented to  Procedure(s): TRABECULECTOMY & Cataract Surgery (Left) as a surgical intervention .  The patient's history has been reviewed, patient examined, no change in status, stable for surgery.  I have reviewed the patient's chart and labs.  Questions were answered to the patient's satisfaction.     Briscoe Daniello

## 2014-03-01 NOTE — Anesthesia Postprocedure Evaluation (Signed)
  Anesthesia Post-op Note  Patient: Trevor Aguilar  Procedure(s) Performed: Procedure(s): TRABECULECTOMY (Left) CATARACT EXTRACTION PHACO AND INTRAOCULAR LENS PLACEMENT (IOC) (Left)  Patient Location: PACU  Anesthesia Type:MAC  Level of Consciousness: awake, alert , oriented and patient cooperative  Airway and Oxygen Therapy: Patient Spontanous Breathing  Post-op Pain: none  Post-op Assessment: Post-op Vital signs reviewed, Patient's Cardiovascular Status Stable, Respiratory Function Stable, Patent Airway, No signs of Nausea or vomiting and Pain level controlled  Post-op Vital Signs: Reviewed and stable  Last Vitals:  Filed Vitals:   03/01/14 1115  BP: 151/48  Pulse: 47  Temp:   Resp:     Complications: No apparent anesthesia complications

## 2014-03-01 NOTE — Op Note (Signed)
Preoperative diagnosis: Uncontrolled glaucoma and visually significant cataract left eye Postoperative diagnosis: Same Procedure: Phacoemulsification with intraocular lens implant and trabeculectomy with mitomycin-C with peripheral iridectomy left eye Anesthesia: 2% Xylocaine in a 50-50 mixture 0.75% Marcaine with ample Wydase with epinephrine Complications: None Assistant: Minglee Procedure: The patient was transported to the operating room where he was given a peribulbar block with the aforementioned local anesthetic agent. All in this the patient's face was prepped and draped in the usual sterile fashion. With the surgeon sitting temporally the operating microscope in position a Weck-Cel sponges used to fixate the globe and a 15 blade was used to enter through inferior temporal clear cornea. Viscoat was injected into the eye chamber it was noted that the pupil was not well dilated with the iris the tube the lens. Weck-Cel sponges used to fixate the globe and a 2.75 mm keratome blade was used in a stepwise fashion through temporal cornea to into the anterior chamber additional Viscoat was injected. Following this a spatula was passed beneath the iris to release adhesions and a Kuglen hook was then used to stretch the iris the pupil then dilated to greater than 6 mm. During the case the pupil did decrease to about 5 mm. Following this a bent 25-gauge needle was used to incise anterior capsule and a continuous tear curvilinear capsulorrhexis was formed the capsule forceps were used to remove the anterior capsule BSS was used to hydrodissect and hydrodelineate the nucleus the nucleus was rotated. The phacoemulsification unit was then used to sculpt the nucleus centrally the iris retracted down the snapper hook was used to push the iris forward and snap the nucleus into 3 quadrants all nuclear fragments were then removed from the eye and the posterior capsule remained intact. I/A was then used to strip cortical  fibers from the posterior capsule after cortical fibers had been removed. This was injected in the anterior chamber. The intraocular lens implant was examined and noted to have no defects the lens was an Alcon AcrySof SN 60 WF IQ lens 20.0 diopter SN #[id number] the lens was injected and unfolded and positioned with a Kuglen hook the subincisional cortex was removed with the I/A. Miochol was injected in the eye a single 10-0 nylon suture was placed to achieve watertight closure. At this point the operating microscope was rotated to the 12:00 position a 6-0 nylon suture was passed through clear cornea to infraducted the eye following this an incision was made in the superior nasal limbus using a Wescott scissors and Hoskins forceps and a fornix-based conjunctival flap was formed bleeding was controlled with cautery. The tenons  fibers were recessed using a Tooke blade. Following this mitomycin-C 0.4 mg per cc was placed onto Gelfoam sponges the sponges were placed beneath the conjunctiva and allowed to stay on the eye for 3 minutes the sponges were then removed and the eye was irrigated with 30 cc of balanced salt solution prior to applying the sponges a 45 blade was used to dissect a half thickness scleral flap. After irrigation the scleral flap was elevated and a 45 blade was used to enter the anterior chamber additional Provisc had been placed and the anterior chamber prior to applying the mitomycin-C. Descemet's punch was used to remove a 1 x 3 block of tissue beneath the scleral flap a basal peripheral iridectomy was formed BSS was injected scleral flap was sutured with 3 interrupted 10-0 nylon sutures following this the conjunctiva was then closed using a 9-0 Vicryl on   a BV 100 needle. BSS was injected in the anterior chamber a fluorescein strip was applied to the incision and there was no leakage noted that the conjunctiva nor from the cornea the anterior chamber was deep the lens implant was in good  position. A subconjunctival injection of Kenalog 4 mg is given in the inferior nasal subconjunctival space. Following this TobraDex ointment was applied to the eye a patch and Fox U. were placed and the patient returned to recovery area in stable condition. Chalmers Guest Junior M.D.

## 2014-03-01 NOTE — H&P (View-Only) (Signed)
                  History & Physical:   DATE:   02-21-14  NAME:  Spaugh, Irene R     [id number]       HISTORY OF PRESENT ILLNESS: Chief Eye Complaints glaucoma blurry vision  Va seems to be getting worse.   HPI: EYES: Reports symptoms of   poor appetite diarrhea associated w Neptazane  film over OS this AM upon awakening      LOCATION:   BOTH EYES        QUALITY/COURSE:   Reports condition is changing.        INTENSITY/SEVERITY:    Reports measurement ( or degree) as severe .      DURATION:   Reports the general length of symptoms to be   ONSET/TIMING:   Reports occurrence as   CONTEXT/WHEN:   Reports usually associated with   MODIFIERS/TREATMENTS:  Improved by              PSYCH/Psychiatric:    Is the pt oriented to time,yes place, person? yes Mood normal    ACTIVE PROBLEMS: Glaucoma associated with ocular inflammations   ICD9: 365.62  Onset: 09/08/2013 13:49  ICD10:  IOP improved  Neptazane 200mg qd  Subacute iritis   ICD9: 364.00  Onset: 09/08/2013 14:09   resolved   ICD10:  OU Meibomian gland dysfunction   ICD9: 375.15 Onset: 10/14/2013 09:41 ICD10: H02.89  Dry eye syndrome   ICD9: 375.15  Onset: 09/08/2013 12:40  ICD10: H04.129  Nuclear sclerosis   ICD10:   ICD9: 366.16  Onset: 02/21/2014 12:52  OS   Pseudophakia   ICD9: 446.0 Onset: 09/08/2013 12:39 ICD10: Z96.1 OD    Visual floaters   ICD9: 379.24  Onset: 09/08/2013 12:39  ICD10: H43.399    Discussed no potential for VA improvement OD ,  phaco emulsion cataract extraction OS  SURGERIES: Cataract Surgery Dec 2013  posterior chamber w/  intraocular lens implant  OD  MEDICATIONS: Prolensa: Strength-  SIG-    Ciloxan (Ciprofloxacin) Solution: 0.3% solution SIG-  1 drop in affected eye twice a day   Lotemax: 0.5% gel SIG-  1 drop in OU QD   Simbrinza: 0.2%-1% suspension SIG-  1 gtt in each affected eye 3 times a day for 30 days   Neptazane: 50 mg tablet SIG-  2 tab(s) orally 2 times a day for 30 days   Restasis:  0.05% emulsion SIG-  1 gtt BID OU  REVIEW OF SYSTEMS: ROS:   GEN- Constitutional: Negative general-constitutional systems review.       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  ALLERGIES: Travoprost:  redness .   Beta blockers, causes decreased heart rate   Trava  PHYSICAL EXAMINATION: VS: BMI: 27.9.  BP: 167/71.  H: 72.00 in.  P: 34 /min.  RR: 20 /min.  W: 205lbs 0oz.  low pulse rate    Va   OD:cc CF @4' PH 20/NI OS:cc 20/60  PH   EYEGLASSES: 09/2012 OD:Pl +0.50x052 OS:pl +0.75x150 ADD:+2.50  MR   OD OS ADD  K's OD: 40.50   41.25 OS: 40.50 40.75  VF:   OD:   Constricted OS:    constricted                                             Motility: orthophoria and full  PUPILS:  sluggish reaction each eye  EYELIDS & OCULAR ADNEXA: inferior ectropion + clogged meibomian glands  each eye  SLE: Conjunctiva: +1 injection each eye with slight inferior chemosis  Cornea: irregular tear film with plus to to +2-3 staining OD, +1-2 staining OS Endothelial KP both eyes    anterior chamber  OD, deep & quiet, OS, deep and quiet  Iris: Gray no defects detected each eye  Lens: OD:  posterior chamber  intraocular lens implant with pigment   OS: Plus to to 3 nuclear sclerosis  Vitreous:  CCT:  Ta   in mmHg    OD 32        OS 32 without Simbrinza  Time 02/21/2014 12:15    Gonio: Difficult view angle appears to be open to scleral spur 360 OD OS angle open 360 to scleral spur   Dilation: not dilated secondary to elevated intraocular pressure  Fundus:  optic nerve   OD Temporal rim loss pale                                                  OS Temporal pallor,  90% cupping inferior rim loss   Macula:  OD   flat                                                 OS  Poor view appears to be flat  Vessels:  Periphery:  Computerized visual field test right eye reveals diffuse loss superior and inferior field with extension into fixation using a V stimulus  Left eye reveals an inferior arcuate defect an inferior nasal step.  There is a dense paracentral scotoma near fixation     Labs all  normal      Exam: GENERAL: Appearance: HEAD, EARS, NOSE AND THROAT: Ears-Nose (external) Inspection: Externally, nose and ears are normal in appearance and without scars, lesions, or nodules.      Hearing assessment shows no problems with normal conversation.      LUNGS and RESPIRATORY: Lung auscultation elicits no wheezing, rhonci, rales or rubs and with equal breath sounds.    Respiratory effort described as breathing is unlabored and chest movement is symmetrical.    HEART (Cardiovascular): Heart auscultation discovers regular rate and rhythm; no murmur, gallop or rub. Normal heart sounds.    ABDOMEN (Gastrointestinal): Mass/Tenderness Exam: Neither are present.     MUSCULOSKELETAL (BJE): Inspection-Palpation: No major bone, joint, tendon, or muscle changes.      NEUROLOGICAL: Alert and oriented. No major deficits of coordination or sensation.      PSYCHIATRIC: Insight and judgment appear  both to be intact and appropriate.    Mood and affect are described as normal mood and full affect.    SKIN: Skin Inspection: No rashes or lesions  ADMITTING DIAGNOSIS: Glaucoma associated with ocular   inflammations   ICD9: 365.62  Onset: 09/08/2013 13:49  Initial Date:   IOP improved BUT SEVERE SIDE EFFECTS ON  Neptazane 200mg qd  Subacute iritis   ICD9: 364.00  Onset: 09/08/2013 14:09  Initial Date:   resolved    OU Meibomian gland dysfunction    Dry eye syndrome   ICD9: 375.15  Onset: 09/08/2013 12:40   Nuclear sclerosis   ICD10:   ICD9: 366.16  Onset: 02/21/2014 12:52   Initial Date:   OS   Pseudophakia   ICD9: 446.0 Onset: 09/08/2013 12:39  OD    Visual floaters   ICD9: 379.24  Onset: 09/08/2013 12:39     Discussed no potential for VA improvement OD ,  phaco emulsion cataract extraction OS  SURGICAL TREATMENT PLAN: Simbrinza each eye now Phacoemulsification combined with  trabeculectomy   left eye with mitomycin C  Risk and benefits of surgery have been reviewed with the patient and the patient agrees to proceed with the surgical procedure.   Decreased Neptazane1 pill BID  Actions:     Handouts: glaucoma , what is glaucoma?, What is a cataract?, New Handout, glaucoma treatment, Script Guide - Restasis.    ___________________________ Ladoris Lythgoe, Jr. Starter - Inactive Problems:    High Cholesterol  

## 2014-03-01 NOTE — Anesthesia Preprocedure Evaluation (Signed)
Anesthesia Evaluation  Patient identified by MRN, date of birth, ID band Patient awake    Reviewed: Allergy & Precautions, H&P , NPO status , Patient's Chart, lab work & pertinent test results  History of Anesthesia Complications Negative for: history of anesthetic complications  Airway Mallampati: II TM Distance: >3 FB Neck ROM: Full    Dental  (+) Chipped, Dental Advisory Given   Pulmonary former smoker (quit 1980s),  breath sounds clear to auscultation        Cardiovascular - anginanegative cardio ROS  Rhythm:Regular Rate:Normal  '13 stress: low risk, no ischemia or perfusion abnormalities. EF 66% '13 ECHO: EF 65%, grade 1 diastolic dysfunction, valves OK     Neuro/Psych negative neurological ROS     GI/Hepatic negative GI ROS, Neg liver ROS,   Endo/Other  negative endocrine ROS  Renal/GU negative Renal ROS     Musculoskeletal   Abdominal   Peds  Hematology negative hematology ROS (+)   Anesthesia Other Findings   Reproductive/Obstetrics                           Anesthesia Physical Anesthesia Plan  ASA: II  Anesthesia Plan: MAC   Post-op Pain Management:    Induction: Intravenous  Airway Management Planned: Natural Airway and Simple Face Mask  Additional Equipment:   Intra-op Plan:   Post-operative Plan:   Informed Consent: I have reviewed the patients History and Physical, chart, labs and discussed the procedure including the risks, benefits and alternatives for the proposed anesthesia with the patient or authorized representative who has indicated his/her understanding and acceptance.   Dental advisory given  Plan Discussed with: CRNA and Surgeon  Anesthesia Plan Comments: (Plan routine monitors, MAC)        Anesthesia Quick Evaluation

## 2014-03-01 NOTE — Discharge Instructions (Signed)
What to eat:  For your first meals, you should eat lightly; only small meals initially.  If you do not have nausea, you may eat larger meals.  Avoid spicy, greasy and heavy food.    General Anesthesia, Adult, Care After  Refer to this sheet in the next few weeks. These instructions provide you with information on caring for yourself after your procedure. Your health care provider may also give you more specific instructions. Your treatment has been planned according to current medical practices, but problems sometimes occur. Call your health care provider if you have any problems or questions after your procedure.  WHAT TO EXPECT AFTER THE PROCEDURE  After the procedure, it is typical to experience:  Sleepiness.  Nausea and vomiting. HOME CARE INSTRUCTIONS  For the first 24 hours after general anesthesia:  Have a responsible person with you.  Do not drive a car. If you are alone, do not take public transportation.  Do not drink alcohol.  Do not take medicine that has not been prescribed by your health care provider.  Do not sign important papers or make important decisions.  You may resume a normal diet and activities as directed by your health care provider.  Change bandages (dressings) as directed.  If you have questions or problems that seem related to general anesthesia, call the hospital and ask for the anesthetist or anesthesiologist on call. SEEK MEDICAL CARE IF:  You have nausea and vomiting that continue the day after anesthesia.  You develop a rash. SEEK IMMEDIATE MEDICAL CARE IF:  You have difficulty breathing.  You have chest pain.  You have any allergic problems. Document Released: 09/01/2000 Document Revised: 01/26/2013 Document Reviewed: 12/09/2012  [name] Patient Information 2014 [place], [place].     The patient should keep the eye patch on the eye. It patch becomes uncomfortable later tonight it can be removed high over to not apply any pressure to the eye. The  patient should sleep on his back or the right side avoid heavy lifting bending or straining.

## 2014-03-03 ENCOUNTER — Encounter (HOSPITAL_COMMUNITY): Payer: Self-pay | Admitting: Ophthalmology

## 2014-05-02 DIAGNOSIS — I071 Rheumatic tricuspid insufficiency: Secondary | ICD-10-CM

## 2014-05-02 HISTORY — DX: Rheumatic tricuspid insufficiency: I07.1

## 2014-05-09 DIAGNOSIS — R21 Rash and other nonspecific skin eruption: Secondary | ICD-10-CM

## 2014-05-09 HISTORY — DX: Rash and other nonspecific skin eruption: R21

## 2014-05-10 DIAGNOSIS — I340 Nonrheumatic mitral (valve) insufficiency: Secondary | ICD-10-CM

## 2014-05-10 HISTORY — DX: Nonrheumatic mitral (valve) insufficiency: I34.0

## 2014-05-11 ENCOUNTER — Encounter (HOSPITAL_COMMUNITY): Payer: Self-pay | Admitting: *Deleted

## 2014-05-11 ENCOUNTER — Ambulatory Visit (HOSPITAL_COMMUNITY)
Admission: RE | Admit: 2014-05-11 | Discharge: 2014-05-11 | Disposition: A | Discharge status: Home / Self Care | Payer: Medicare Other | Source: Ambulatory Visit | Attending: Cardiology | Admitting: Cardiology

## 2014-05-11 ENCOUNTER — Encounter (HOSPITAL_COMMUNITY)
Admission: RE | Disposition: A | Discharge status: Home / Self Care | Payer: Self-pay | Source: Ambulatory Visit | Attending: Cardiology

## 2014-05-11 DIAGNOSIS — Q211 Atrial septal defect: Secondary | ICD-10-CM | POA: Insufficient documentation

## 2014-05-11 DIAGNOSIS — I358 Other nonrheumatic aortic valve disorders: Secondary | ICD-10-CM

## 2014-05-11 DIAGNOSIS — I700 Atherosclerosis of aorta: Secondary | ICD-10-CM | POA: Insufficient documentation

## 2014-05-11 DIAGNOSIS — I340 Nonrheumatic mitral (valve) insufficiency: Secondary | ICD-10-CM | POA: Insufficient documentation

## 2014-05-11 HISTORY — DX: Other nonrheumatic aortic valve disorders: I35.8

## 2014-05-11 HISTORY — DX: Essential (primary) hypertension: I10

## 2014-05-11 HISTORY — PX: TEE WITHOUT CARDIOVERSION: SHX5443

## 2014-05-11 SURGERY — ECHOCARDIOGRAM, TRANSESOPHAGEAL
Anesthesia: Moderate Sedation

## 2014-05-11 MED ORDER — SODIUM CHLORIDE 0.9 % IV SOLN
INTRAVENOUS | Status: DC
  Administered 2014-05-11: 500 mL via INTRAVENOUS

## 2014-05-11 MED ORDER — FENTANYL CITRATE 0.05 MG/ML IJ SOLN
INTRAMUSCULAR | Status: DC | PRN
  Administered 2014-05-11: 25 ug via INTRAVENOUS

## 2014-05-11 MED ORDER — DIPHENHYDRAMINE HCL 50 MG/ML IJ SOLN
INTRAMUSCULAR | Status: AC
  Filled 2014-05-11: qty 1

## 2014-05-11 MED ORDER — MIDAZOLAM HCL 10 MG/2ML IJ SOLN
INTRAMUSCULAR | Status: DC | PRN
  Administered 2014-05-11: 2 mg via INTRAVENOUS

## 2014-05-11 MED ORDER — AMIODARONE HCL 400 MG PO TABS: 400.0000 mg | ORAL_TABLET | Freq: Three times a day (TID) | ORAL | Status: DC

## 2014-05-11 MED ORDER — FENTANYL CITRATE 0.05 MG/ML IJ SOLN
INTRAMUSCULAR | Status: AC
  Filled 2014-05-11: qty 2

## 2014-05-11 MED ORDER — BUTAMBEN-TETRACAINE-BENZOCAINE 2-2-14 % EX AERO
INHALATION_SPRAY | CUTANEOUS | Status: DC | PRN
  Administered 2014-05-11: 1 via TOPICAL

## 2014-05-11 MED ORDER — MIDAZOLAM HCL 5 MG/ML IJ SOLN
INTRAMUSCULAR | Status: AC
  Filled 2014-05-11: qty 2

## 2014-05-11 NOTE — Progress Notes (Signed)
  Echocardiogram Echocardiogram Transesophageal has been performed.  Arvil ChacoFoster, Anaisa Radi 05/11/2014, 8:59 AM

## 2014-05-11 NOTE — CV Procedure (Signed)
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 Mild aortic sclerosis. Normal LVEF. PFO with color flor e/o left to right shunt. Severe LA enlargement

## 2014-05-11 NOTE — Discharge Instructions (Signed)
Transesophageal Echocardiogram °Transesophageal echocardiography (TEE) is a picture test of your heart using sound waves. The pictures taken can give very detailed pictures of your heart. This can help your doctor see if there are problems with your heart. TEE can check: °· If your heart has blood clots in it. °· How well your heart valves are working. °· If you have an infection on the inside of your heart. °· Some of the major arteries of your heart. °· If your heart valve is working after a repair. °· Your heart before a procedure that uses a shock to your heart to get the rhythm back to normal. °BEFORE THE PROCEDURE °· Do not eat or drink for 6 hours before the procedure or as told by your doctor. °· Make plans to have someone drive you home after the procedure. Do not drive yourself home. °· An IV tube will be put in your arm. °PROCEDURE °· You will be given a medicine to help you relax (sedative). It will be given through the IV tube. °· A numbing medicine will be sprayed or gargled in the back of your throat to help numb it. °· The tip of the probe is placed into the back of your mouth. You will be asked to swallow. This helps to pass the probe into your esophagus. °· Once the tip of the probe is in the right place, your doctor can take pictures of your heart. °· You may feel pressure at the back of your throat. °AFTER THE PROCEDURE °· You will be taken to a recovery area so the sedative can wear off. °· Your throat may be sore and scratchy. This will go away slowly over time. °· You will go home when you are fully awake and able to swallow liquids. °· You should have someone stay with you for the next 24 hours. °· Do not drive or operate machinery for the next 24 hours. °Document Released: 03/23/2009 Document Revised: 05/31/2013 Document Reviewed: 11/25/2012 °[employer]® Patient Information ©2015 [employer]. This information is not intended to replace advice given to you by your health care provider. Make  sure you discuss any questions you have with your health care provider. ° °

## 2014-05-11 NOTE — H&P (Signed)
  Please see office visit notes for complete details of HPI.  

## 2014-05-11 NOTE — Interval H&P Note (Signed)
History and Physical Interval Note:  05/11/2014 8:22 AM  Trevor Aguilar  has presented today for surgery, with the diagnosis of severe mitral regurgitation  The various methods of treatment have been discussed with the patient and family. After consideration of risks, benefits and other options for treatment, the patient has consented to  Procedure(s): TRANSESOPHAGEAL ECHOCARDIOGRAM (TEE) (N/A) as a surgical intervention .  The patient's history has been reviewed, patient examined, no change in status, stable for surgery.  I have reviewed the patient's chart and labs.  Questions were answered to the patient's satisfaction.     Pamella PertGANJI,JAGADEESH R

## 2014-05-12 ENCOUNTER — Other Ambulatory Visit: Payer: Self-pay

## 2014-05-12 ENCOUNTER — Encounter (HOSPITAL_COMMUNITY): Payer: Self-pay | Admitting: Cardiology

## 2014-05-12 ENCOUNTER — Ambulatory Visit (HOSPITAL_COMMUNITY)
Admission: RE | Admit: 2014-05-12 | Discharge: 2014-05-12 | Disposition: A | Discharge status: Home / Self Care | Payer: Medicare Other | Source: Ambulatory Visit | Attending: Cardiology | Admitting: Cardiology

## 2014-05-12 ENCOUNTER — Encounter (HOSPITAL_COMMUNITY)
Admission: RE | Disposition: A | Discharge status: Home / Self Care | Payer: Medicare Other | Source: Ambulatory Visit | Attending: Cardiology

## 2014-05-12 DIAGNOSIS — I272 Other secondary pulmonary hypertension: Secondary | ICD-10-CM | POA: Insufficient documentation

## 2014-05-12 DIAGNOSIS — I340 Nonrheumatic mitral (valve) insufficiency: Secondary | ICD-10-CM

## 2014-05-12 DIAGNOSIS — Q211 Atrial septal defect: Secondary | ICD-10-CM | POA: Insufficient documentation

## 2014-05-12 DIAGNOSIS — N182 Chronic kidney disease, stage 2 (mild): Secondary | ICD-10-CM | POA: Diagnosis not present

## 2014-05-12 DIAGNOSIS — E785 Hyperlipidemia, unspecified: Secondary | ICD-10-CM | POA: Insufficient documentation

## 2014-05-12 DIAGNOSIS — I2510 Atherosclerotic heart disease of native coronary artery without angina pectoris: Secondary | ICD-10-CM

## 2014-05-12 DIAGNOSIS — E1122 Type 2 diabetes mellitus with diabetic chronic kidney disease: Secondary | ICD-10-CM | POA: Diagnosis not present

## 2014-05-12 DIAGNOSIS — I129 Hypertensive chronic kidney disease with stage 1 through stage 4 chronic kidney disease, or unspecified chronic kidney disease: Secondary | ICD-10-CM | POA: Insufficient documentation

## 2014-05-12 HISTORY — DX: Pulmonary hypertension, unspecified: I27.20

## 2014-05-12 HISTORY — DX: Atherosclerotic heart disease of native coronary artery without angina pectoris: I25.10

## 2014-05-12 HISTORY — PX: LEFT AND RIGHT HEART CATHETERIZATION WITH CORONARY ANGIOGRAM: SHX5449

## 2014-05-12 LAB — BASIC METABOLIC PANEL
Anion gap: 17 — ABNORMAL HIGH (ref 5–15)
BUN: 22 mg/dL (ref 6–23)
CHLORIDE: 106 meq/L (ref 96–112)
CO2: 20 mEq/L (ref 19–32)
Calcium: 9.2 mg/dL (ref 8.4–10.5)
Creatinine, Ser: 1.25 mg/dL (ref 0.50–1.35)
GFR calc Af Amer: 63 mL/min — ABNORMAL LOW (ref >90)
GFR, EST NON AFRICAN AMERICAN: 54 mL/min — AB (ref >90)
GLUCOSE: 91 mg/dL (ref 70–99)
POTASSIUM: 4.3 meq/L (ref 3.7–5.3)
Sodium: 143 mEq/L (ref 137–147)

## 2014-05-12 SURGERY — LEFT AND RIGHT HEART CATHETERIZATION WITH CORONARY ANGIOGRAM
Anesthesia: LOCAL

## 2014-05-12 MED ORDER — MIDAZOLAM HCL 2 MG/2ML IJ SOLN
INTRAMUSCULAR | Status: AC
  Filled 2014-05-12: qty 2

## 2014-05-12 MED ORDER — HEPARIN (PORCINE) IN NACL 2-0.9 UNIT/ML-% IJ SOLN
INTRAMUSCULAR | Status: AC
  Filled 2014-05-12: qty 1500

## 2014-05-12 MED ORDER — SODIUM CHLORIDE 0.9 % IV SOLN: 250.0000 mL | INTRAVENOUS | Status: DC | PRN

## 2014-05-12 MED ORDER — LIDOCAINE HCL (PF) 1 % IJ SOLN
INTRAMUSCULAR | Status: AC
  Filled 2014-05-12: qty 30

## 2014-05-12 MED ORDER — SODIUM CHLORIDE 0.9 % IJ SOLN: 3.0000 mL | Freq: Two times a day (BID) | INTRAMUSCULAR | Status: DC

## 2014-05-12 MED ORDER — ASPIRIN 81 MG PO CHEW: 81.0000 mg | CHEWABLE_TABLET | ORAL | Status: DC

## 2014-05-12 MED ORDER — FENTANYL CITRATE 0.05 MG/ML IJ SOLN
INTRAMUSCULAR | Status: AC
  Filled 2014-05-12: qty 2

## 2014-05-12 MED ORDER — NITROGLYCERIN 1 MG/10 ML FOR IR/CATH LAB
INTRA_ARTERIAL | Status: AC
  Filled 2014-05-12: qty 10

## 2014-05-12 MED ORDER — SODIUM CHLORIDE 0.9 % IV SOLN: INTRAVENOUS | Status: DC

## 2014-05-12 MED ORDER — SODIUM CHLORIDE 0.9 % IJ SOLN: 3.0000 mL | INTRAMUSCULAR | Status: DC | PRN

## 2014-05-12 NOTE — Progress Notes (Signed)
Site area: right groin a 5 french venous sheath and a 7 french venous sheath was removed  Site Prior to Removal:  Level 0  Pressure Applied For 20 MINUTES    Minutes Beginning at 0945am  Manual:   Yes.    Patient Status During Pull:  stable  Post Pull Groin Site:  Level 0  Post Pull Instructions Given:  Yes.    Post Pull Pulses Present:  Yes.    Dressing Applied:  Yes.    Comments:  VS remain stable during sheath pull.  Pt denies any pain at this time.

## 2014-05-12 NOTE — Discharge Instructions (Signed)

## 2014-05-13 LAB — POCT I-STAT 3, ART BLOOD GAS (G3+)
Acid-base deficit: 4 mmol/L — ABNORMAL HIGH (ref 0.0–2.0)
Bicarbonate: 20.3 mEq/L (ref 20.0–24.0)
O2 Saturation: 91 %
PH ART: 7.384 (ref 7.350–7.450)
PO2 ART: 61 mmHg — AB (ref 80.0–100.0)
TCO2: 21 mmol/L (ref 0–100)
pCO2 arterial: 34 mmHg — ABNORMAL LOW (ref 35.0–45.0)

## 2014-05-13 LAB — POCT I-STAT 3, VENOUS BLOOD GAS (G3P V)
Acid-base deficit: 3 mmol/L — ABNORMAL HIGH (ref 0.0–2.0)
Bicarbonate: 22.7 mEq/L (ref 20.0–24.0)
O2 Saturation: 69 %
PH VEN: 7.343 — AB (ref 7.250–7.300)
TCO2: 24 mmol/L (ref 0–100)
pCO2, Ven: 41.8 mmHg — ABNORMAL LOW (ref 45.0–50.0)
pO2, Ven: 38 mmHg (ref 30.0–45.0)

## 2014-05-13 NOTE — CV Procedure (Signed)
Procedures performed: Right and left heart catheterization and occlusion of cardiac output and cardiac index by Fick. Right femoral arterial access and right femoral venous access was utilized for performing the procedure.   Indication: Patient is a 78 year-old Caucasian male with diabetes, hyperlipidemia, hypertension,  Frequent PVCs on the EKG (old), recently was found to have new onset of her murmur.  Physical examination was consistent with severe mitral regurgitation.  He is also symptomatic with class 3-4 shortness of breath.  Outpatient echocardiogram had revealed flail posterior leaflet of the mitral valve and TEE confirmed severe mitral regurgitation.  Hence is brought to the angiography suite to evaluate for pulmonary hypertension and evaluation of cardiac output and cardiac index along with evaluation of coronary artery disease prior to mitral valve repair/replacement.  A 7 French  sheath introduced into right femoral vein access. A 6.5 French Swan-Ganz catheter was advanced with balloon inflated on the sheath under fluoroscopic guidance into first the right atrium followed by the right ventricle and into the pulmonary artery to pulmonary artery wedge position. Hemodynamics were obtained in a locations.  After hemodynamics were completed, samples were taken for SaO2% measurement to be [facility].  The catheter was then pulled back the balloon down and then completely out of the body.   Left Heart Catheterization   First a 5 French right femoral arterial access was obtained in the usual fashion and 5 [place] 4 diagnostic catheter was advanced over standard J-wire into the ascending aorta and used to engage  the Left coronary Artery. Multiple cineangiographic views of the Left then a 5 French Judkins right catheter was utilized to engage the right coronary artery and the same catheter was advanced into the left ventricle for monitoring the hemodynamics. Hemodynamics were  then resampled and the catheter pulled back across the aortic valve for measurement of "pullback" gradient. The catheter was then removed the body over wire. All exchanges were made over standard J wire.   Due to marked elevation in LVEDP, stage II chronic kidney disease, left ventricular gram was not performed as the mitral regurgitation has already been confirmed.  Minimal contrast was utilized, total of 40 cc for the entire procedure.  Patient tolerated the procedure well.  There was no immediate complication.  Procedural data:  RA pressure 17/7  Mean 14 mm mercury.  RV pressure 71/7 and Right ventricular EDP 16 mm Hg. PA pressure 81/34 with a mean of 48  mm mercury. PA saturation 69%. PVR was 213 dynes Pulmonary capillary wedge 10/32 with a mean of 30 mm Hg. Giant V wave was evident on pulmonary capillary wedge tracing. Aortic saturation 91%.  Cardiac output was 6.75 with cardiac index of 3.15  by Fick.   Angiographic data  Left ventricle: not performed due to markedly elevated LVEDP and to conserve contrast due to stage II chronic kidney disease from diabetes mellitus.  Right coronary artery: Smooth,very large, dominant and mild luminal irregularity present.  Left main coronary artery: Normal. No stenosis. Very mild luminal irregularity evident.   LAD: Large, has a high-grade eccentric mildly calcified proximal long segment 80-85% stenosis.  It givesorigin to a moderate sized diagonal 1 which is smooth and normal.   Circumflex coronary artery:  Moderate caliber vessel, mild disease.  Impression: severe pulmonary hypertension due to mitral regurgitation with preserved cardiac output and cardiac index.  Severe single-vessel coronary artery disease involving the proximal LAD.  Outpatient TEE and TTE both are confirmed.  Flail mitral  leaflet, involving the posterior mitral valve. Patient also has a PFO.  Recommendation: Patient has already been referred to Dr. Tressie Stalkerlarence Owen for evaluation  of mitral valve disease.  A total of 40 cc of contrast has been utilized for diagnostic angiography.

## 2014-05-17 ENCOUNTER — Encounter: Payer: Self-pay | Admitting: *Deleted

## 2014-05-17 DIAGNOSIS — S0291XA Unspecified fracture of skull, initial encounter for closed fracture: Secondary | ICD-10-CM | POA: Insufficient documentation

## 2014-05-17 DIAGNOSIS — I493 Ventricular premature depolarization: Secondary | ICD-10-CM | POA: Insufficient documentation

## 2014-05-17 DIAGNOSIS — I739 Peripheral vascular disease, unspecified: Secondary | ICD-10-CM

## 2014-05-18 ENCOUNTER — Institutional Professional Consult (permissible substitution) (INDEPENDENT_AMBULATORY_CARE_PROVIDER_SITE_OTHER): Payer: Medicare Other | Admitting: Thoracic Surgery (Cardiothoracic Vascular Surgery)

## 2014-05-18 ENCOUNTER — Encounter: Payer: Self-pay | Admitting: Thoracic Surgery (Cardiothoracic Vascular Surgery)

## 2014-05-18 VITALS — BP 128/64 | HR 72 | Resp 20 | Ht 72.0 in | Wt 207.0 lb

## 2014-05-18 DIAGNOSIS — I2510 Atherosclerotic heart disease of native coronary artery without angina pectoris: Secondary | ICD-10-CM

## 2014-05-18 DIAGNOSIS — I340 Nonrheumatic mitral (valve) insufficiency: Secondary | ICD-10-CM

## 2014-05-18 DIAGNOSIS — I5032 Chronic diastolic (congestive) heart failure: Secondary | ICD-10-CM

## 2014-05-18 DIAGNOSIS — I5033 Acute on chronic diastolic (congestive) heart failure: Secondary | ICD-10-CM

## 2014-05-18 DIAGNOSIS — I059 Rheumatic mitral valve disease, unspecified: Secondary | ICD-10-CM

## 2014-05-18 HISTORY — DX: Acute on chronic diastolic (congestive) heart failure: I50.33

## 2014-05-18 HISTORY — DX: Chronic diastolic (congestive) heart failure: I50.32

## 2014-05-18 NOTE — Progress Notes (Signed)
[address]       [name] [address]             [phone]     CARDIOTHORACIC SURGERY CONSULTATION REPORT  Referring Provider is Pamella Pert, MD PCP is Eartha Inch, MD  Chief Complaint  Patient presents with  . Mitral Regurgitation    Surgical eval, TEE 05/11/14, Cardiac Cath 05/12/14, ECHO 05/02/14, Carotid duplex 05/16/14    HPI:  Patient is a 78 year old white male from [facility] referred by Dr. Jacinto Halim surgical treatment of severe symptomatic mitral regurgitation.  The patient has history of hypertension, hyperlipidemia, peripheral arterial disease and multi-focal PVCs without any previous history of coronary artery disease, heart murmur or congestive heart failure.  Approximately 2 years ago he saw Dr. Jacinto Halim and underwent a fairly complete cardiac workup to evaluate irregular heart rhythm. He was noted to have frequent PVCs. Transthoracic echocardiogram performed at that time was reportedly normal. The patient additionally had a nuclear stress test at that time that was felt to be low risk. Approximately a year and a half ago the patient began to experience slow functional decline that initially began with severe problems with his eyesight because of cataracts and glaucoma. His physical activity decreased. He began to experience some progressive exertional fatigue and shortness of breath. He was recently seen by a physician assistant at his primary care physician's office and noted to have a loud murmur on physical exam. He was referred back to Dr. Jacinto Halim who performed a transthoracic echocardiogram in the office on 05/02/2014.  By report this echocardiogram demonstrated the presence of mildly depressed left ventricular systolic function with ejection fraction estimated 51%. There was mitral valve prolapse with a flail segment of the posterior leaflet and severe mitral regurgitation. There was moderate tricuspid regurgitation and moderate pulmonary  hypertension.  At that time the patient was started on metoprolol and losartan.  Metoprolol was later discontinued because of acute exacerbation of shortness of breath with leg swelling and leg pain.  Lasix has been added recently for treatment of worsening shortness of breath and lower extremity swelling.  He was recently started on amiodarone.  The patient underwent transesophageal echocardiogram on 05/11/2014. This confirmed the presence of flail segment of the posterior leaflet with severe mitral regurgitation. Left and right heart catheterization was performed 05/12/2014. This was reported to demonstrate severe single-vessel coronary artery disease with long segment 80-85% stenosis of the proximal left anterior descending coronary artery.  There was severe pulmonary hypertension. The patient was referred for surgical consultation.  The patient is married and lives locally in [place] with his wife. 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 The patient states that over the past year and a half's ability to work has decreased precipitously. This initially developed because of poor eyesight. The patient later began to experience exertional fatigue and some shortness of breath. The patient's symptoms of exertional shortness of breath had become acutely worse over the past 2 weeks. He now gets short of breath with minimal activity and occasionally at rest. He cannot lie flat in bed. He has had recent acute exacerbation of lower extremity swelling. He denies any chest pain or chest tightness either with activity or rest.  He has a long history of irregular heart rhythm without palpitations, dizzy spells, or syncope. The patient also has recently had  problems with skin breakdown over his buttocks for which he has been treated with topical creams at his primary care physician's office.  Past Medical History  Diagnosis Date  . Umbilical hernia   . Nonspecific abnormal electrocardiogram (ECG) (EKG)     Pt states he had a cardiac workup and passed all tests (Echo and Stress)  . Hearing loss   . Bilateral cataracts   . High cholesterol   . Glaucoma   . Pneumonia 1967  . Hypertension   . History of skull fracture 1978    lower skull  . Coronary artery disease 05/12/2014    High grade proximal LAD stenosis and moderate RCA stenosis  . Pulmonary hypertension 05/12/2014  . Tricuspid regurgitation 05/02/2014    Moderate TR by transthoracic ECHO  . Mitral regurgitation 05/12/2014    Mitral valve prolapse with flail segment of posterior leaflet and severe mitral regurgitation   . Aortic valve sclerosis 05/11/2014    Aortic valve sclerosis w/out stenosis  . Acute on chronic diastolic congestive heart failure 05/18/2014  . Chronic diastolic congestive heart failure 05/18/2014    Past Surgical History  Procedure Laterality Date  . Tonsillectomy    . Facial fracture surgery  1970's  . Colonoscopy    . Trabeculectomy Left 03/01/2014    Procedure: TRABECULECTOMY;  Surgeon: Chalmers Guest, MD;  Location: [facility] OR;  Service: Ophthalmology;  Laterality: Left;  . Cataract extraction w/phaco Left 03/01/2014    Procedure: CATARACT EXTRACTION PHACO AND INTRAOCULAR LENS PLACEMENT (IOC);  Surgeon: Chalmers Guest, MD;  Location: [facility] OR;  Service: Ophthalmology;  Laterality: Left;  . Tee without cardioversion N/A 05/11/2014    Procedure: TRANSESOPHAGEAL ECHOCARDIOGRAM (TEE);  Surgeon: Pamella Pert, MD;  Location: [facility] ENDOSCOPY;  Service: Cardiovascular;  Laterality: N/A;  . Eye surgery Right 05/14/12    cataract removal with lens implant  . Left and right heart catheterization with coronary angiogram N/A 05/12/2014    Procedure: LEFT AND RIGHT HEART  CATHETERIZATION WITH CORONARY ANGIOGRAM;  Surgeon: Pamella Pert, MD;  Location: [facility] CATH LAB;  Service: Cardiovascular;  Laterality: N/A;    Family History  Problem Relation Age of Onset  . Cancer Mother     breast  . Alcohol abuse Father   . Cataracts Father   . Heart disease Father     angina  . Arthritis Sister     History   Social History  . Marital Status: Married    Spouse Name: N/A    Number of Children: N/A  . Years of Education: N/A   Occupational History  . Not on file.   Social History Main Topics  . Smoking status: Former Smoker    Quit date: 03/01/1983  . Smokeless tobacco: Never Used  . Alcohol Use: 10.5 oz/week    21 Not specified per week     Comment: 2 - 4 glasses of scotch daily  . Drug Use: No  . Sexual Activity: Not on file   Other Topics Concern  . Not on file   Social History Narrative    Current Outpatient Prescriptions  Medication Sig Dispense Refill  . amiodarone (PACERONE) 400 MG tablet Take 1 tablet (400 mg total) by mouth 3 (three) times daily. 90 tablet 0  . aspirin 81 MG tablet Take 81 mg by mouth daily.    . Brinzolamide-Brimonidine (SIMBRINZA) 1-0.2 % SUSP  Place 1 drop into the right eye 3 (three) times daily.     . chlorthalidone (HYGROTON) 25 MG tablet Take 12.5 mg by mouth daily.    . diclofenac (VOLTAREN) 75 MG EC tablet Take 75 mg by mouth 2 (two) times daily.    . Difluprednate (DUREZOL) 0.05 % EMUL Place 1 drop into the left eye 3 (three) times daily.    . fish oil-omega-3 fatty acids 1000 MG capsule Take 1 g by mouth daily.    . furosemide (LASIX) 40 MG tablet Take 40 mg by mouth 2 (two) times daily. For 3 days    . ofloxacin (OCUFLOX) 0.3 % ophthalmic solution Place 1 drop into the right eye 4 (four) times daily.    . potassium chloride (K-DUR) 10 MEQ tablet Take 10 mEq by mouth daily.    . simvastatin (ZOCOR) 20 MG tablet Take 20 mg by mouth daily.     Marland Kitchen tiZANidine (ZANAFLEX) 4 MG tablet Take 4 mg by mouth at bedtime.     . Vitamin D, Ergocalciferol, (DRISDOL) 50000 UNITS CAPS capsule Take 50,000 Units by mouth every 7 (seven) days. Monday     No current facility-administered medications for this visit.    Allergies  Allergen Reactions  . Metoprolol Tartrate Shortness Of Breath    ALSO HAD LEG PAIN  . Restoril [Temazepam] Other (See Comments)    BAD DREAMS      Review of Systems:   General:  decreased appetite, decreased energy, no weight gain, no weight loss, no fever  Cardiac:  no chest pain with exertion, no chest pain at rest, + SOB with minimal exertion, occasional resting SOB, no PND, + orthopnea, no palpitations, + arrhythmia, no atrial fibrillation, + LE edema, no dizzy spells, no syncope  Respiratory:  + shortness of breath, no home oxygen, no productive cough, no dry cough, no bronchitis, no wheezing, no hemoptysis, no asthma, no pain with inspiration or cough, no sleep apnea, no CPAP at night  GI:   no difficulty swallowing, no reflux, no frequent heartburn, no hiatal hernia, no abdominal pain, no constipation, no diarrhea, no hematochezia, no hematemesis, no melena  GU:   no dysuria,  + frequency, no urinary tract infection, no hematuria, no enlarged prostate, no kidney stones, no kidney disease  Vascular:  no pain suggestive of claudication, no pain in feet, no leg cramps, no varicose veins, no DVT, no non-healing foot ulcer  Neuro:   no stroke, no TIA's, no seizures, no headaches, no temporary blindness one eye,  no slurred speech, no peripheral neuropathy, no chronic pain, no instability of gait, denies memory/cognitive dysfunction  Musculoskeletal: no arthritis, no joint swelling, no myalgias, no difficulty walking, normal mobility   Skin:   + rash, no itching, no skin infections, + pressure sores or ulcerations on buttocks  Psych:   no anxiety, no depression, no nervousness, + unusual recent stress  Eyes:   + blurry vision, no floaters, + recent vision changes, no wears glasses or  contacts  ENT:   + hearing loss, no loose or painful teeth, + partial upper dentures, last saw dentist June 2015  Hematologic:  no easy bruising, no abnormal bleeding, no clotting disorder, no frequent epistaxis  Endocrine:  no diabetes, does not check CBG's at home     Physical Exam:   BP 128/64 mmHg  Pulse 72  Resp 20  Ht 6' (1.829 m)  Wt 207 lb (93.895 kg)  BMI 28.07 kg/m2  SpO2 96%  General:  Moderately obese, well-appearing  HEENT:  Unremarkable   Neck:   no JVD, no bruits, no adenopathy   Chest:   clear to auscultation, symmetrical breath sounds, no wheezes, no rhonchi   CV:   Irregular rate and rhythm, prominent holosystolic murmur   Abdomen:  soft, non-tender, no masses   Extremities:  warm, well-perfused, pulses diminished, 3+ bilateral LE edema  Rectal/GU  Deferred  Neuro:   Grossly non-focal and symmetrical throughout  Skin:   Clean and dry, + breakdown on both gluteal surfaces c/w pressure sores - no cellulitis   Diagnostic Tests:  TRANSTHORACIC ECHOCARDIOGRAM  Report from transthoracic echocardiogram performed 05/02/2014 is reviewed.  Images are not currently available for review. By report there was mild left ventricular systolic dysfunction with ejection fraction calculated 51%. There was mild-to-moderate left atrial enlargement. There was mild-to-moderate right atrial enlargement. There was mild right ventricular enlargement with normal right ventricular systolic function. There was flail posterior mitral leaflet with severe mitral regurgitation and mild mitral annulus calcification. There was moderate tricuspid regurgitation. There was moderate pulmonary hypertension.    TRANSESOPHAGEAL ECHOCARDIOGRAM  Severe MR due to flail posterior MV leaflet. Mild aortic sclerosis. Normal LVEF. PFO with color flor e/o left to right shunt. Severe LA enlargement        CARDIAC CATHETERIZATION  Procedures performed: Right and left heart catheterization and occlusion of  cardiac output and cardiac index by Fick. Right femoral arterial access and right femoral venous access was utilized for performing the procedure.   Indication: Patient is a 78 year-old Caucasian male with diabetes, hyperlipidemia, hypertension, Frequent PVCs on the EKG (old), recently was found to have new onset of her murmur. Physical examination was consistent with severe mitral regurgitation. He is also symptomatic with class 3-4 shortness of breath. Outpatient echocardiogram had revealed flail posterior leaflet of the mitral valve and TEE confirmed severe mitral regurgitation. Hence is brought to the angiography suite to evaluate for pulmonary hypertension and evaluation of cardiac output and cardiac index along with evaluation of coronary artery disease prior to mitral valve repair/replacement.  A 7 French sheath introduced into right femoral vein access. A 6.5 French Swan-Ganz catheter was advanced with balloon inflated on the sheath under fluoroscopic guidance into first the right atrium followed by the right ventricle and into the pulmonary artery to pulmonary artery wedge position. Hemodynamics were obtained in a locations.  After hemodynamics were completed, samples were taken for SaO2% measurement to be used in [facility]Index catheterization.  The catheter was then pulled back the balloon down and then completely out of the body.   Left Heart Catheterization   First a 5 French right femoral arterial access was obtained in the usual fashion and 5 [place] FL 4 diagnostic catheter was advanced over standard J-wire into the ascending aorta and used to engage the Left coronary Artery. Multiple cineangiographic views of the Left then a 5 French Judkins right catheter was utilized to engage the right coronary artery and the same catheter was advanced into the left ventricle for monitoring the hemodynamics. Hemodynamics were then resampled and the catheter pulled back across the aortic valve for  measurement of "pullback" gradient. The catheter was then removed the body over wire. All exchanges were made over standard J wire.   Due to marked elevation in LVEDP, stage II chronic kidney disease, left ventricular gram was not performed as the mitral regurgitation has already been confirmed. Minimal contrast was utilized, total of 40 cc for the entire procedure.  Patient tolerated the procedure well. There was no immediate complication.  Procedural data:  RA pressure 17/7 Mean 14 mm mercury.  RV pressure 71/7 and Right ventricular EDP 16 mm Hg. PA pressure 81/34 with a mean of 48 mm mercury. PA saturation 69%. PVR was 213 dynes Pulmonary capillary wedge 10/32 with a mean of 30 mm Hg. Giant V wave was evident on pulmonary capillary wedge tracing. Aortic saturation 91%.  Cardiac output was 6.75 with cardiac index of 3.15 by Fick.   Angiographic data  Left ventricle: not performed due to markedly elevated LVEDP and to conserve contrast due to stage II chronic kidney disease from diabetes mellitus.  Right coronary artery: Smooth,very large, dominant and mild luminal irregularity present.  Left main coronary artery: Normal. No stenosis. Very mild luminal irregularity evident.  LAD: Large, has a high-grade eccentric mildly calcified proximal long segment 80-85% stenosis. It givesorigin to a moderate sized diagonal 1 which is smooth and normal.   Circumflex coronary artery: Moderate caliber vessel, mild disease.  Impression: severe pulmonary hypertension due to mitral regurgitation with preserved cardiac output and cardiac index. Severe single-vessel coronary artery disease involving the proximal LAD.  Outpatient TEE and TTE both are confirmed. Flail mitral leaflet, involving the posterior mitral valve. Patient also has a PFO.  Recommendation: Patient has already been referred to Dr. Tressie Stalker for evaluation of mitral valve disease. A total of 40 cc of contrast has been  utilized for diagnostic angiography.           Impression:  The patient has stage D severe symptomatic primary mitral regurgitation with mild left ventricular systolic dysfunction, moderate to severe pulmonary hypertension, moderate tricuspid regurgitation, and two-vessel coronary artery disease. He presents with acute exacerbation of chronic diastolic congestive heart failure, which over the past 2 weeks has been at least New York Heart Association functional class III.  I have personally reviewed the patient's recent transesophageal echocardiogram and diagnostic cardiac catheterization. The patient has degenerative disease of the mitral valve with severe prolapse involving a flail segment of the posterior leaflet with severe mitral regurgitation. There is significant annular calcification. Left ventricular systolic function is mildly reduced. There is moderate tricuspid regurgitation. Catheterization is notable for long segment tubular 80-85% stenosis of the proximal left anterior descending coronary artery. There is also significant disease in the right coronary artery, particularly beyond the bifurcation takeoff of the posterior descending coronary artery. I agree the patient should be treated with elective mitral valve repair and coronary artery bypass grafting.   Plan:  The patient and his wife were counseled at length regarding the indications, risks and potential benefits of mitral valve repair and coronary artery bypass grafting.  The rationale for elective surgery has been explained, including a comparison between surgery and continued medical therapy with close follow-up.  The likelihood of successful and durable valve repair has been discussed with particular reference to the findings of their recent echocardiogram.  Based upon these findings and previous experience, I have quoted them a greater than 90 percent likelihood of successful valve repair.  In the unlikely event that their valve  cannot be successfully repaired, we discussed the possibility of replacing the mitral valve using a mechanical prosthesis with the attendant need for long-term anticoagulation versus the alternative of replacing it using a bioprosthetic tissue valve with its potential for late structural valve deterioration and failure, depending upon the patient's longevity.  The patient specifically requests that if the mitral valve must be replaced that it be  done using a bioprosthetic tissue valve.   The patient understands and accepts all potential risks of surgery including but not limited to risk of death, stroke or other neurologic complication, myocardial infarction, congestive heart failure, respiratory failure, renal failure, bleeding requiring transfusion and/or reexploration, arrhythmia, infection or other wound complications, pneumonia, pleural and/or pericardial effusion, pulmonary embolus, aortic dissection or other major vascular complication, late recurrence of symptomatic ischemic heart disease, or delayed complications related to valve repair or replacement including but not limited to structural valve deterioration and failure, thrombosis, embolization, endocarditis, or paravalvular leak.  All of their questions have been answered.  We plan to proceed with surgery on Thursday, 05/25/2014. The patient has been instructed to stop taking fish oil capsules. He will otherwise remain on all other medications without change between now and the time of surgery.   I spent in excess of 90 minutes during the conduct of this office consultation and >50% of this time involved direct face-to-face encounter with the patient for counseling and/or coordination of their care.   Salvatore Decentlarence H. Cornelius Moraswen, MD 05/18/2014 5:20 PM

## 2014-05-18 NOTE — Patient Instructions (Signed)
Patient has been instructed to stop taking fish oil capsules NOW  Patient should continue taking all other medications without change through the day before surgery.  Patient should have nothing to eat or drink after midnight the night before surgery.  On the morning of surgery patient should NOT take any of his regular medications.

## 2014-05-19 ENCOUNTER — Other Ambulatory Visit: Payer: Self-pay | Admitting: *Deleted

## 2014-05-19 DIAGNOSIS — I340 Nonrheumatic mitral (valve) insufficiency: Secondary | ICD-10-CM

## 2014-05-19 DIAGNOSIS — I2510 Atherosclerotic heart disease of native coronary artery without angina pectoris: Secondary | ICD-10-CM

## 2014-05-22 NOTE — Pre-Procedure Instructions (Signed)
Trevor Aguilar  05/22/2014   Your procedure is scheduled on:  Thursday, December 17th  [facility] 530 AM.  Call this number if you have problems the morning of surgery: [phone]   Remember:   Do not eat food or drink liquids after midnight.   Take these medicines the morning of surgery with A SIP OF WATER: amiodarone   Do not wear jewelry.  Do not wear lotions, powders, or perfumes,deodorant.  Do not shave 48 hours prior to surgery. Men may shave face and neck.  Do not bring valuables to the hospital.  [facility] is not responsible   for any belongings or valuables.               Contacts, dentures or bridgework may not be worn into surgery.  Leave suitcase in the car. After surgery it may be brought to your room.  For patients admitted to the hospital, discharge time is determined by your treatment team.            Please read over the following fact sheets that you were given: Pain Booklet, Coughing and Deep Breathing, Blood Transfusion Information, MRSA Information and Surgical Site Infection Prevention  [facility] - Preparing for Surgery  Before surgery, you can play an important role.  Because skin is not sterile, your skin needs to be as free of germs as possible.  You can reduce the number of germs on you skin by washing with CHG (chlorahexidine gluconate) soap before surgery.  CHG is an antiseptic cleaner which kills germs and bonds with the skin to continue killing germs even after washing.  Please DO NOT use if you have an allergy to CHG or antibacterial soaps.  If your skin becomes reddened/irritated stop using the CHG and inform your nurse when you arrive at Short Stay.  Do not shave (including legs and underarms) for at least 48 hours prior to the first CHG shower.  You may shave your face.  Please follow these instructions carefully:   1.  Shower with CHG Soap the night before surgery and the morning of Surgery.  2.  If you  choose to wash your hair, wash your hair first as usual with your normal shampoo.  3.  After you shampoo, rinse your hair and body thoroughly to remove the shampoo.  4.  Use CHG as you would any other liquid soap.  You can apply CHG directly to the skin and wash gently with scrungie or a clean washcloth.  5.  Apply the CHG Soap to your body ONLY FROM THE NECK DOWN.  Do not use on open wounds or open sores.  Avoid contact with your eyes, ears, mouth and genitals (private parts).  Wash genitals (private parts) with your normal soap.  6.  Wash thoroughly, paying special attention to the area where your surgery will be performed.  7.  Thoroughly rinse your body with warm water from the neck down.  8.  DO NOT shower/wash with your normal soap after using and rinsing off the CHG Soap.  9.  Pat yourself dry with a clean towel.            10.  Wear clean pajamas.            11.  Place clean sheets on your bed the night of your first shower and do not sleep with pets.  Day of Surgery  Do not apply any lotions/deoderants the morning of surgery.  Please wear clean clothes to the hospital/surgery center.

## 2014-05-23 ENCOUNTER — Encounter (HOSPITAL_COMMUNITY)
Admission: RE | Admit: 2014-05-23 | Discharge: 2014-05-23 | Disposition: A | Discharge status: Home / Self Care | Payer: Medicare Other | Source: Ambulatory Visit | Attending: Thoracic Surgery (Cardiothoracic Vascular Surgery) | Admitting: Thoracic Surgery (Cardiothoracic Vascular Surgery)

## 2014-05-23 ENCOUNTER — Ambulatory Visit (HOSPITAL_COMMUNITY)
Admission: RE | Admit: 2014-05-23 | Discharge: 2014-05-23 | Disposition: A | Discharge status: Home / Self Care | Payer: Medicare Other | Source: Ambulatory Visit | Attending: Thoracic Surgery (Cardiothoracic Vascular Surgery) | Admitting: Thoracic Surgery (Cardiothoracic Vascular Surgery)

## 2014-05-23 ENCOUNTER — Encounter (HOSPITAL_COMMUNITY): Payer: Self-pay

## 2014-05-23 VITALS — BP 136/55 | HR 61 | Temp 97.6°F | Resp 20 | Ht 72.0 in | Wt 207.0 lb

## 2014-05-23 DIAGNOSIS — Z01818 Encounter for other preprocedural examination: Secondary | ICD-10-CM | POA: Insufficient documentation

## 2014-05-23 DIAGNOSIS — R911 Solitary pulmonary nodule: Secondary | ICD-10-CM | POA: Insufficient documentation

## 2014-05-23 DIAGNOSIS — I2510 Atherosclerotic heart disease of native coronary artery without angina pectoris: Secondary | ICD-10-CM

## 2014-05-23 DIAGNOSIS — Z01810 Encounter for preprocedural cardiovascular examination: Secondary | ICD-10-CM

## 2014-05-23 DIAGNOSIS — R0602 Shortness of breath: Secondary | ICD-10-CM | POA: Insufficient documentation

## 2014-05-23 DIAGNOSIS — I340 Nonrheumatic mitral (valve) insufficiency: Secondary | ICD-10-CM

## 2014-05-23 DIAGNOSIS — R9431 Abnormal electrocardiogram [ECG] [EKG]: Secondary | ICD-10-CM | POA: Diagnosis not present

## 2014-05-23 HISTORY — DX: Rash and other nonspecific skin eruption: R21

## 2014-05-23 HISTORY — DX: Cardiac murmur, unspecified: R01.1

## 2014-05-23 HISTORY — DX: Reserved for inherently not codable concepts without codable children: IMO0001

## 2014-05-23 LAB — CBC
HEMATOCRIT: 43.9 % (ref 39.0–52.0)
Hemoglobin: 14.7 g/dL (ref 13.0–17.0)
MCH: 32.2 pg (ref 26.0–34.0)
MCHC: 33.5 g/dL (ref 30.0–36.0)
MCV: 96.3 fL (ref 78.0–100.0)
PLATELETS: 240 10*3/uL (ref 150–400)
RBC: 4.56 MIL/uL (ref 4.22–5.81)
RDW: 13.9 % (ref 11.5–15.5)
WBC: 10.9 10*3/uL — AB (ref 4.0–10.5)

## 2014-05-23 LAB — COMPREHENSIVE METABOLIC PANEL
ALBUMIN: 4.1 g/dL (ref 3.5–5.2)
ALT: 24 U/L (ref 0–53)
AST: 28 U/L (ref 0–37)
Alkaline Phosphatase: 144 U/L — ABNORMAL HIGH (ref 39–117)
Anion gap: 18 — ABNORMAL HIGH (ref 5–15)
BILIRUBIN TOTAL: 1.3 mg/dL — AB (ref 0.3–1.2)
BUN: 29 mg/dL — ABNORMAL HIGH (ref 6–23)
CHLORIDE: 94 meq/L — AB (ref 96–112)
CO2: 25 mEq/L (ref 19–32)
CREATININE: 1.43 mg/dL — AB (ref 0.50–1.35)
Calcium: 10.2 mg/dL (ref 8.4–10.5)
GFR calc Af Amer: 53 mL/min — ABNORMAL LOW (ref >90)
GFR calc non Af Amer: 46 mL/min — ABNORMAL LOW (ref >90)
Glucose, Bld: 122 mg/dL — ABNORMAL HIGH (ref 70–99)
Potassium: 4 mEq/L (ref 3.7–5.3)
Sodium: 137 mEq/L (ref 137–147)
Total Protein: 8.1 g/dL (ref 6.0–8.3)

## 2014-05-23 LAB — URINALYSIS, ROUTINE W REFLEX MICROSCOPIC
BILIRUBIN URINE: NEGATIVE
Glucose, UA: NEGATIVE mg/dL
HGB URINE DIPSTICK: NEGATIVE
Ketones, ur: NEGATIVE mg/dL
Leukocytes, UA: NEGATIVE
Nitrite: NEGATIVE
PH: 7 (ref 5.0–8.0)
Protein, ur: NEGATIVE mg/dL
Specific Gravity, Urine: 1.007 (ref 1.005–1.030)
Urobilinogen, UA: 1 mg/dL (ref 0.0–1.0)

## 2014-05-23 LAB — BLOOD GAS, ARTERIAL
Acid-Base Excess: 5.8 mmol/L — ABNORMAL HIGH (ref 0.0–2.0)
Bicarbonate: 29.2 mEq/L — ABNORMAL HIGH (ref 20.0–24.0)
DRAWN BY: 42180
FIO2: 0.21 %
O2 Saturation: 93.3 %
PCO2 ART: 38.2 mmHg (ref 35.0–45.0)
Patient temperature: 98.6
TCO2: 30.4 mmol/L (ref 0–100)
pH, Arterial: 7.496 — ABNORMAL HIGH (ref 7.350–7.450)
pO2, Arterial: 65.8 mmHg — ABNORMAL LOW (ref 80.0–100.0)

## 2014-05-23 LAB — PROTIME-INR
INR: 1.12 (ref 0.00–1.49)
Prothrombin Time: 14.5 seconds (ref 11.6–15.2)

## 2014-05-23 LAB — ABO/RH: ABO/RH(D): O NEG

## 2014-05-23 LAB — SURGICAL PCR SCREEN
MRSA, PCR: NEGATIVE
STAPHYLOCOCCUS AUREUS: NEGATIVE

## 2014-05-23 LAB — HEMOGLOBIN A1C
HEMOGLOBIN A1C: 6.1 % — AB (ref <5.7)
MEAN PLASMA GLUCOSE: 128 mg/dL — AB (ref <117)

## 2014-05-23 LAB — APTT: APTT: 30 s (ref 24–37)

## 2014-05-23 NOTE — Progress Notes (Signed)
Patient denied having any chest pain or shortness of breath. Patient had Doppler studies performed today, but is coming back tomorrow at 0830 to have PFT's done. Wife at chair side during PAT visit. Abnormal EKG noted, but a prior EKG was noted under the "care everywhere" tab. Therefore Nurse [place].

## 2014-05-23 NOTE — Progress Notes (Signed)
Nurse called and left a voicemail with Ryan at Dr. Orvan Julywen's office requesting that he review ABG results.

## 2014-05-23 NOTE — Progress Notes (Signed)
VASCULAR LAB PRELIMINARY  PRELIMINARY  PRELIMINARY  PRELIMINARY  Pre-op Cardiac Surgery  Carotid Findings: Carotid duplex done at Dr. Jodi MarbleGangi's office 05/16/2014 bilateral within normal limits  Upper Extremity Right Left  Brachial Pressures 144 Triphasic 143 Triphasic  Radial Waveforms Triphasic Triphasic  Ulnar Waveforms Triphasic Triphasic  Palmar Arch (Allen's Test) Abnormal Normal   Findings:  Doppler waveforms remained normal with radial compression and diminished greater than 50% with ulnar compression on the right. Left Doppler waveforms remained normal with both radial and ulnar compressions.    Lower  Extremity Right Left  Dorsalis Pedis 155 Biphasic 157 Biphasic  Posterior Tibial 161 Biphasic 157 Biphasic  Ankle/Brachial Indices 1.12 1.09    Findings:  ABIs and Doppler waveforms are within normal limits bilaterally at rest.   Trevor Aguilar, RVS 05/23/2014, 6:45 PM

## 2014-05-24 ENCOUNTER — Ambulatory Visit (HOSPITAL_COMMUNITY)
Admission: RE | Admit: 2014-05-24 | Discharge: 2014-05-24 | Disposition: A | Discharge status: Home / Self Care | Payer: Medicare Other | Source: Ambulatory Visit | Attending: Thoracic Surgery (Cardiothoracic Vascular Surgery) | Admitting: Thoracic Surgery (Cardiothoracic Vascular Surgery)

## 2014-05-24 ENCOUNTER — Other Ambulatory Visit: Payer: Self-pay | Admitting: *Deleted

## 2014-05-24 DIAGNOSIS — I2510 Atherosclerotic heart disease of native coronary artery without angina pectoris: Secondary | ICD-10-CM | POA: Diagnosis not present

## 2014-05-24 DIAGNOSIS — J479 Bronchiectasis, uncomplicated: Secondary | ICD-10-CM | POA: Insufficient documentation

## 2014-05-24 DIAGNOSIS — R911 Solitary pulmonary nodule: Secondary | ICD-10-CM

## 2014-05-24 DIAGNOSIS — I517 Cardiomegaly: Secondary | ICD-10-CM | POA: Diagnosis not present

## 2014-05-24 DIAGNOSIS — I340 Nonrheumatic mitral (valve) insufficiency: Secondary | ICD-10-CM | POA: Insufficient documentation

## 2014-05-24 LAB — PULMONARY FUNCTION TEST
DL/VA % pred: 97 %
DL/VA: 4.47 ml/min/mmHg/L
DLCO UNC % PRED: 77 %
DLCO UNC: 25.1 ml/min/mmHg
DLCO cor % pred: 76 %
DLCO cor: 24.82 ml/min/mmHg
FEF 25-75 POST: 2.59 L/s
FEF 25-75 Pre: 2.02 L/sec
FEF2575-%Change-Post: 28 %
FEF2575-%Pred-Post: 121 %
FEF2575-%Pred-Pre: 94 %
FEV1-%Change-Post: 5 %
FEV1-%PRED-POST: 94 %
FEV1-%Pred-Pre: 88 %
FEV1-Post: 2.82 L
FEV1-Pre: 2.66 L
FEV1FVC-%Change-Post: 4 %
FEV1FVC-%Pred-Pre: 104 %
FEV6-%Change-Post: 2 %
FEV6-%PRED-POST: 92 %
FEV6-%PRED-PRE: 90 %
FEV6-POST: 3.6 L
FEV6-Pre: 3.5 L
FEV6FVC-%CHANGE-POST: 1 %
FEV6FVC-%PRED-POST: 106 %
FEV6FVC-%Pred-Pre: 105 %
FVC-%Change-Post: 1 %
FVC-%Pred-Post: 86 %
FVC-%Pred-Pre: 85 %
FVC-POST: 3.6 L
FVC-PRE: 3.55 L
PRE FEV1/FVC RATIO: 75 %
PRE FEV6/FVC RATIO: 99 %
Post FEV1/FVC ratio: 78 %
Post FEV6/FVC ratio: 100 %
RV % pred: 85 %
RV: 2.21 L
TLC % pred: 84 %
TLC: 5.93 L

## 2014-05-24 MED ORDER — PHENYLEPHRINE HCL 10 MG/ML IJ SOLN
30.0000 ug/min | INTRAVENOUS | Status: DC
  Filled 2014-05-24: qty 2

## 2014-05-24 MED ORDER — DEXTROSE 5 % IV SOLN
1.5000 g | INTRAVENOUS | Status: DC
  Filled 2014-05-24: qty 1.5

## 2014-05-24 MED ORDER — SODIUM CHLORIDE 0.9 % IV SOLN
INTRAVENOUS | Status: DC
  Filled 2014-05-24: qty 30

## 2014-05-24 MED ORDER — DEXMEDETOMIDINE HCL IN NACL 400 MCG/100ML IV SOLN
0.1000 ug/kg/h | INTRAVENOUS | Status: DC
  Filled 2014-05-24: qty 100

## 2014-05-24 MED ORDER — NITROGLYCERIN IN D5W 200-5 MCG/ML-% IV SOLN
2.0000 ug/min | INTRAVENOUS | Status: DC
Start: 2014-05-25 — End: 2014-05-25
  Filled 2014-05-24: qty 250

## 2014-05-24 MED ORDER — VANCOMYCIN HCL 1000 MG IV SOLR
INTRAVENOUS | Status: DC
  Filled 2014-05-24: qty 1000

## 2014-05-24 MED ORDER — DEXTROSE 5 % IV SOLN
750.0000 mg | INTRAVENOUS | Status: DC
  Filled 2014-05-24: qty 750

## 2014-05-24 MED ORDER — EPINEPHRINE HCL 1 MG/ML IJ SOLN
0.0000 ug/min | INTRAVENOUS | Status: DC
  Filled 2014-05-24: qty 4

## 2014-05-24 MED ORDER — ALBUTEROL SULFATE (2.5 MG/3ML) 0.083% IN NEBU
2.5000 mg | INHALATION_SOLUTION | Freq: Once | RESPIRATORY_TRACT | Status: AC
  Administered 2014-05-24: 2.5 mg via RESPIRATORY_TRACT

## 2014-05-24 MED ORDER — AMINOCAPROIC ACID 250 MG/ML IV SOLN
INTRAVENOUS | Status: DC
  Filled 2014-05-24: qty 40

## 2014-05-24 MED ORDER — VANCOMYCIN HCL 10 G IV SOLR
1250.0000 mg | INTRAVENOUS | Status: DC
  Filled 2014-05-24: qty 1250

## 2014-05-24 MED ORDER — MAGNESIUM SULFATE 50 % IJ SOLN
40.0000 meq | INTRAMUSCULAR | Status: DC
  Filled 2014-05-24: qty 10

## 2014-05-24 MED ORDER — PLASMA-LYTE 148 IV SOLN
INTRAVENOUS | Status: DC
  Filled 2014-05-24: qty 2.5

## 2014-05-24 MED ORDER — DOPAMINE-DEXTROSE 3.2-5 MG/ML-% IV SOLN
0.0000 ug/kg/min | INTRAVENOUS | Status: DC
  Filled 2014-05-24: qty 250

## 2014-05-24 MED ORDER — SODIUM CHLORIDE 0.9 % IV SOLN
INTRAVENOUS | Status: DC
  Filled 2014-05-24: qty 2.5

## 2014-05-24 MED ORDER — POTASSIUM CHLORIDE 2 MEQ/ML IV SOLN
80.0000 meq | INTRAVENOUS | Status: DC
  Filled 2014-05-24: qty 40

## 2014-05-25 ENCOUNTER — Inpatient Hospital Stay: Admit: 2014-05-25 | Payer: Self-pay | Admitting: Thoracic Surgery (Cardiothoracic Vascular Surgery)

## 2014-05-25 ENCOUNTER — Encounter (HOSPITAL_COMMUNITY)
Admission: RE | Disposition: A | Discharge status: Home / Self Care | Payer: Medicare Other | Source: Ambulatory Visit | Attending: Thoracic Surgery (Cardiothoracic Vascular Surgery)

## 2014-05-25 ENCOUNTER — Inpatient Hospital Stay (HOSPITAL_COMMUNITY): Payer: Medicare Other | Admitting: Certified Registered Nurse Anesthetist

## 2014-05-25 ENCOUNTER — Inpatient Hospital Stay (HOSPITAL_COMMUNITY)
Admission: RE | Admit: 2014-05-25 | Discharge: 2014-06-02 | DRG: 219 | Disposition: A | Discharge status: Home / Self Care | Payer: Medicare Other | Source: Ambulatory Visit | Attending: Thoracic Surgery (Cardiothoracic Vascular Surgery) | Admitting: Thoracic Surgery (Cardiothoracic Vascular Surgery)

## 2014-05-25 ENCOUNTER — Inpatient Hospital Stay (HOSPITAL_COMMUNITY): Payer: Medicare Other

## 2014-05-25 ENCOUNTER — Encounter (HOSPITAL_COMMUNITY): Payer: Self-pay | Admitting: *Deleted

## 2014-05-25 DIAGNOSIS — I9789 Other postprocedural complications and disorders of the circulatory system, not elsewhere classified: Secondary | ICD-10-CM | POA: Diagnosis not present

## 2014-05-25 DIAGNOSIS — E876 Hypokalemia: Secondary | ICD-10-CM | POA: Diagnosis not present

## 2014-05-25 DIAGNOSIS — Z7982 Long term (current) use of aspirin: Secondary | ICD-10-CM

## 2014-05-25 DIAGNOSIS — J9811 Atelectasis: Secondary | ICD-10-CM

## 2014-05-25 DIAGNOSIS — E119 Type 2 diabetes mellitus without complications: Secondary | ICD-10-CM | POA: Diagnosis present

## 2014-05-25 DIAGNOSIS — N182 Chronic kidney disease, stage 2 (mild): Secondary | ICD-10-CM | POA: Diagnosis present

## 2014-05-25 DIAGNOSIS — I340 Nonrheumatic mitral (valve) insufficiency: Secondary | ICD-10-CM | POA: Diagnosis present

## 2014-05-25 DIAGNOSIS — I272 Other secondary pulmonary hypertension: Secondary | ICD-10-CM | POA: Diagnosis present

## 2014-05-25 DIAGNOSIS — E785 Hyperlipidemia, unspecified: Secondary | ICD-10-CM | POA: Diagnosis present

## 2014-05-25 DIAGNOSIS — I2510 Atherosclerotic heart disease of native coronary artery without angina pectoris: Secondary | ICD-10-CM

## 2014-05-25 DIAGNOSIS — Z974 Presence of external hearing-aid: Secondary | ICD-10-CM | POA: Diagnosis not present

## 2014-05-25 DIAGNOSIS — H9190 Unspecified hearing loss, unspecified ear: Secondary | ICD-10-CM | POA: Diagnosis present

## 2014-05-25 DIAGNOSIS — I5032 Chronic diastolic (congestive) heart failure: Secondary | ICD-10-CM | POA: Diagnosis present

## 2014-05-25 DIAGNOSIS — Z87891 Personal history of nicotine dependence: Secondary | ICD-10-CM

## 2014-05-25 DIAGNOSIS — Z951 Presence of aortocoronary bypass graft: Secondary | ICD-10-CM

## 2014-05-25 DIAGNOSIS — I4891 Unspecified atrial fibrillation: Secondary | ICD-10-CM | POA: Diagnosis not present

## 2014-05-25 DIAGNOSIS — D696 Thrombocytopenia, unspecified: Secondary | ICD-10-CM | POA: Diagnosis not present

## 2014-05-25 DIAGNOSIS — I5033 Acute on chronic diastolic (congestive) heart failure: Secondary | ICD-10-CM | POA: Diagnosis present

## 2014-05-25 DIAGNOSIS — D62 Acute posthemorrhagic anemia: Secondary | ICD-10-CM | POA: Diagnosis not present

## 2014-05-25 DIAGNOSIS — I700 Atherosclerosis of aorta: Secondary | ICD-10-CM | POA: Diagnosis present

## 2014-05-25 DIAGNOSIS — I493 Ventricular premature depolarization: Secondary | ICD-10-CM | POA: Diagnosis present

## 2014-05-25 DIAGNOSIS — Q211 Atrial septal defect: Secondary | ICD-10-CM

## 2014-05-25 DIAGNOSIS — H409 Unspecified glaucoma: Secondary | ICD-10-CM | POA: Diagnosis present

## 2014-05-25 DIAGNOSIS — Z9889 Other specified postprocedural states: Secondary | ICD-10-CM

## 2014-05-25 DIAGNOSIS — I129 Hypertensive chronic kidney disease with stage 1 through stage 4 chronic kidney disease, or unspecified chronic kidney disease: Secondary | ICD-10-CM | POA: Diagnosis present

## 2014-05-25 DIAGNOSIS — I739 Peripheral vascular disease, unspecified: Secondary | ICD-10-CM | POA: Diagnosis present

## 2014-05-25 DIAGNOSIS — Y838 Other surgical procedures as the cause of abnormal reaction of the patient, or of later complication, without mention of misadventure at the time of the procedure: Secondary | ICD-10-CM | POA: Diagnosis not present

## 2014-05-25 HISTORY — DX: Presence of aortocoronary bypass graft: Z95.1

## 2014-05-25 HISTORY — PX: TEE WITHOUT CARDIOVERSION: SHX5443

## 2014-05-25 HISTORY — PX: CORONARY ARTERY BYPASS GRAFT: SHX141

## 2014-05-25 HISTORY — DX: Other specified postprocedural states: Z98.890

## 2014-05-25 HISTORY — PX: MITRAL VALVE REPAIR: SHX2039

## 2014-05-25 LAB — CBC
HCT: 19.3 % — ABNORMAL LOW (ref 39.0–52.0)
HEMATOCRIT: 22.7 % — AB (ref 39.0–52.0)
HEMOGLOBIN: 7.8 g/dL — AB (ref 13.0–17.0)
HEMOGLOBIN: 6.7 g/dL — AB (ref 13.0–17.0)
MCH: 32 pg (ref 26.0–34.0)
MCH: 30.9 pg (ref 26.0–34.0)
MCHC: 34.4 g/dL (ref 30.0–36.0)
MCHC: 34.7 g/dL (ref 30.0–36.0)
MCV: 93 fL (ref 78.0–100.0)
MCV: 88.9 fL (ref 78.0–100.0)
Platelets: 83 10*3/uL — ABNORMAL LOW (ref 150–400)
Platelets: 166 10*3/uL (ref 150–400)
RBC: 2.44 MIL/uL — ABNORMAL LOW (ref 4.22–5.81)
RBC: 2.17 MIL/uL — AB (ref 4.22–5.81)
RDW: 14.8 % (ref 11.5–15.5)
RDW: 15 % (ref 11.5–15.5)
WBC: 23.6 10*3/uL — ABNORMAL HIGH (ref 4.0–10.5)
WBC: 18.4 10*3/uL — ABNORMAL HIGH (ref 4.0–10.5)

## 2014-05-25 LAB — POCT I-STAT, CHEM 8
BUN: 24 mg/dL — ABNORMAL HIGH (ref 6–23)
BUN: 21 mg/dL (ref 6–23)
BUN: 22 mg/dL (ref 6–23)
BUN: 20 mg/dL (ref 6–23)
BUN: 19 mg/dL (ref 6–23)
BUN: 18 mg/dL (ref 6–23)
CHLORIDE: 96 meq/L (ref 96–112)
Calcium, Ion: 1.19 mmol/L (ref 1.13–1.30)
Calcium, Ion: 1.05 mmol/L — ABNORMAL LOW (ref 1.13–1.30)
Calcium, Ion: 1.17 mmol/L (ref 1.13–1.30)
Calcium, Ion: 0.99 mmol/L — ABNORMAL LOW (ref 1.13–1.30)
Calcium, Ion: 0.93 mmol/L — ABNORMAL LOW (ref 1.13–1.30)
Calcium, Ion: 1.17 mmol/L (ref 1.13–1.30)
Chloride: 96 meq/L (ref 96–112)
Chloride: 93 mEq/L — ABNORMAL LOW (ref 96–112)
Chloride: 94 meq/L — ABNORMAL LOW (ref 96–112)
Chloride: 96 meq/L (ref 96–112)
Chloride: 96 meq/L (ref 96–112)
Creatinine, Ser: 1.4 mg/dL — ABNORMAL HIGH (ref 0.50–1.35)
Creatinine, Ser: 1.3 mg/dL (ref 0.50–1.35)
Creatinine, Ser: 1.3 mg/dL (ref 0.50–1.35)
Creatinine, Ser: 1.2 mg/dL (ref 0.50–1.35)
Creatinine, Ser: 1 mg/dL (ref 0.50–1.35)
Creatinine, Ser: 1.1 mg/dL (ref 0.50–1.35)
GLUCOSE: 146 mg/dL — AB (ref 70–99)
Glucose, Bld: 147 mg/dL — ABNORMAL HIGH (ref 70–99)
Glucose, Bld: 136 mg/dL — ABNORMAL HIGH (ref 70–99)
Glucose, Bld: 143 mg/dL — ABNORMAL HIGH (ref 70–99)
Glucose, Bld: 194 mg/dL — ABNORMAL HIGH (ref 70–99)
Glucose, Bld: 250 mg/dL — ABNORMAL HIGH (ref 70–99)
HCT: 41 % (ref 39.0–52.0)
HCT: 28 % — ABNORMAL LOW (ref 39.0–52.0)
HCT: 35 % — ABNORMAL LOW (ref 39.0–52.0)
HCT: 23 % — ABNORMAL LOW (ref 39.0–52.0)
HCT: 19 % — ABNORMAL LOW (ref 39.0–52.0)
HCT: 25 % — ABNORMAL LOW (ref 39.0–52.0)
Hemoglobin: 13.9 g/dL (ref 13.0–17.0)
Hemoglobin: 11.9 g/dL — ABNORMAL LOW (ref 13.0–17.0)
Hemoglobin: 9.5 g/dL — ABNORMAL LOW (ref 13.0–17.0)
Hemoglobin: 7.8 g/dL — ABNORMAL LOW (ref 13.0–17.0)
Hemoglobin: 6.5 g/dL — CL (ref 13.0–17.0)
Hemoglobin: 8.5 g/dL — ABNORMAL LOW (ref 13.0–17.0)
Potassium: 3.1 meq/L — ABNORMAL LOW (ref 3.7–5.3)
Potassium: 2.8 mEq/L — CL (ref 3.7–5.3)
Potassium: 3.9 mEq/L (ref 3.7–5.3)
Potassium: 3.4 meq/L — ABNORMAL LOW (ref 3.7–5.3)
Potassium: 3.4 meq/L — ABNORMAL LOW (ref 3.7–5.3)
Potassium: 3.1 meq/L — ABNORMAL LOW (ref 3.7–5.3)
SODIUM: 135 meq/L — AB (ref 137–147)
Sodium: 138 meq/L (ref 137–147)
Sodium: 138 mEq/L (ref 137–147)
Sodium: 135 meq/L — ABNORMAL LOW (ref 137–147)
Sodium: 136 meq/L — ABNORMAL LOW (ref 137–147)
Sodium: 137 meq/L (ref 137–147)
TCO2: 29 mmol/L (ref 0–100)
TCO2: 26 mmol/L (ref 0–100)
TCO2: 29 mmol/L (ref 0–100)
TCO2: 27 mmol/L (ref 0–100)
TCO2: 24 mmol/L (ref 0–100)
TCO2: 22 mmol/L (ref 0–100)

## 2014-05-25 LAB — POCT I-STAT 4, (NA,K, GLUC, HGB,HCT)
Glucose, Bld: 162 mg/dL — ABNORMAL HIGH (ref 70–99)
HCT: 24 % — ABNORMAL LOW (ref 39.0–52.0)
Hemoglobin: 8.2 g/dL — ABNORMAL LOW (ref 13.0–17.0)
Potassium: 2.9 mEq/L — CL (ref 3.7–5.3)
Sodium: 140 mEq/L (ref 137–147)

## 2014-05-25 LAB — POCT I-STAT 3, ART BLOOD GAS (G3+)
Acid-Base Excess: 5 mmol/L — ABNORMAL HIGH (ref 0.0–2.0)
Acid-base deficit: 1 mmol/L (ref 0.0–2.0)
Acid-base deficit: 5 mmol/L — ABNORMAL HIGH (ref 0.0–2.0)
Acid-base deficit: 2 mmol/L (ref 0.0–2.0)
BICARBONATE: 20.6 meq/L (ref 20.0–24.0)
Bicarbonate: 29.8 mEq/L — ABNORMAL HIGH (ref 20.0–24.0)
Bicarbonate: 24.6 mEq/L — ABNORMAL HIGH (ref 20.0–24.0)
Bicarbonate: 23.1 mEq/L (ref 20.0–24.0)
O2 Saturation: 100 %
O2 Saturation: 100 %
O2 Saturation: 93 %
O2 Saturation: 97 %
PCO2 ART: 43.8 mmHg (ref 35.0–45.0)
PCO2 ART: 37.9 mmHg (ref 35.0–45.0)
PO2 ART: 67 mmHg — AB (ref 80.0–100.0)
Patient temperature: 35.5
Patient temperature: 36.8
TCO2: 31 mmol/L (ref 0–100)
TCO2: 26 mmol/L (ref 0–100)
TCO2: 22 mmol/L (ref 0–100)
TCO2: 24 mmol/L (ref 0–100)
pCO2 arterial: 45.3 mmHg — ABNORMAL HIGH (ref 35.0–45.0)
pCO2 arterial: 38.8 mmHg (ref 35.0–45.0)
pH, Arterial: 7.441 (ref 7.350–7.450)
pH, Arterial: 7.343 — ABNORMAL LOW (ref 7.350–7.450)
pH, Arterial: 7.337 — ABNORMAL LOW (ref 7.350–7.450)
pH, Arterial: 7.382 (ref 7.350–7.450)
pO2, Arterial: 349 mmHg — ABNORMAL HIGH (ref 80.0–100.0)
pO2, Arterial: 389 mmHg — ABNORMAL HIGH (ref 80.0–100.0)
pO2, Arterial: 87 mmHg (ref 80.0–100.0)

## 2014-05-25 LAB — PLATELET COUNT: Platelets: 128 K/uL — ABNORMAL LOW (ref 150–400)

## 2014-05-25 LAB — HEMOGLOBIN AND HEMATOCRIT, BLOOD
HCT: 19.6 % — ABNORMAL LOW (ref 39.0–52.0)
Hemoglobin: 6.7 g/dL — CL (ref 13.0–17.0)

## 2014-05-25 LAB — CREATININE, SERUM
Creatinine, Ser: 1.19 mg/dL (ref 0.50–1.35)
GFR calc non Af Amer: 58 mL/min — ABNORMAL LOW (ref >90)
GFR, EST AFRICAN AMERICAN: 67 mL/min — AB (ref >90)

## 2014-05-25 LAB — PREPARE RBC (CROSSMATCH)

## 2014-05-25 LAB — MAGNESIUM: MAGNESIUM: 2.7 mg/dL — AB (ref 1.5–2.5)

## 2014-05-25 LAB — APTT: aPTT: 45 seconds — ABNORMAL HIGH (ref 24–37)

## 2014-05-25 LAB — PROTIME-INR
INR: 2.47 — ABNORMAL HIGH (ref 0.00–1.49)
PROTHROMBIN TIME: 27 s — AB (ref 11.6–15.2)

## 2014-05-25 SURGERY — REPAIR, MITRAL VALVE
Anesthesia: General | Site: Mouth

## 2014-05-25 MED ORDER — INSULIN REGULAR BOLUS VIA INFUSION
0.0000 [IU] | Freq: Three times a day (TID) | INTRAVENOUS | Status: DC
  Filled 2014-05-25: qty 10

## 2014-05-25 MED ORDER — SODIUM CHLORIDE 0.9 % IV SOLN
INTRAVENOUS | Status: AC
  Administered 2014-05-25: 2.6 [IU]/h via INTRAVENOUS

## 2014-05-25 MED ORDER — SODIUM CHLORIDE 0.9 % IR SOLN
Status: DC | PRN
  Administered 2014-05-25: 6000 mL

## 2014-05-25 MED ORDER — EPINEPHRINE HCL 1 MG/ML IJ SOLN: 0.0000 ug/min | INTRAVENOUS | Status: DC

## 2014-05-25 MED ORDER — ACETAMINOPHEN 500 MG PO TABS
1000.0000 mg | ORAL_TABLET | Freq: Four times a day (QID) | ORAL | Status: AC
  Administered 2014-05-26 – 2014-05-30 (×17): 1000 mg via ORAL
  Filled 2014-05-25 (×20): qty 2

## 2014-05-25 MED ORDER — FUROSEMIDE 10 MG/ML IJ SOLN
40.0000 mg | Freq: Once | INTRAMUSCULAR | Status: AC
Start: 2014-05-25 — End: 2014-05-25
  Administered 2014-05-25: 40 mg via INTRAVENOUS
  Filled 2014-05-25: qty 4

## 2014-05-25 MED ORDER — FAMOTIDINE IN NACL 20-0.9 MG/50ML-% IV SOLN
20.0000 mg | Freq: Two times a day (BID) | INTRAVENOUS | Status: DC
  Administered 2014-05-25: 20 mg via INTRAVENOUS

## 2014-05-25 MED ORDER — TRAMADOL HCL 50 MG PO TABS
50.0000 mg | ORAL_TABLET | ORAL | Status: DC | PRN
  Administered 2014-05-27: 50 mg via ORAL
  Administered 2014-05-27: 100 mg via ORAL
  Filled 2014-05-25 (×2): qty 2

## 2014-05-25 MED ORDER — BISACODYL 10 MG RE SUPP: 10.0000 mg | Freq: Every day | RECTAL | Status: DC

## 2014-05-25 MED ORDER — MILRINONE IN DEXTROSE 20 MG/100ML IV SOLN
0.1250 ug/kg/min | INTRAVENOUS | Status: DC
  Administered 2014-05-25: 0.375 ug/kg/min via INTRAVENOUS
  Filled 2014-05-25: qty 100

## 2014-05-25 MED ORDER — CHLORHEXIDINE GLUCONATE 4 % EX LIQD: 30.0000 mL | CUTANEOUS | Status: DC

## 2014-05-25 MED ORDER — MAGNESIUM SULFATE 4 GM/100ML IV SOLN
4.0000 g | Freq: Once | INTRAVENOUS | Status: AC
  Administered 2014-05-25: 4 g via INTRAVENOUS

## 2014-05-25 MED ORDER — PROPOFOL 10 MG/ML IV BOLUS
INTRAVENOUS | Status: AC
  Filled 2014-05-25: qty 20

## 2014-05-25 MED ORDER — VANCOMYCIN HCL 1000 MG IV SOLR
1000.0000 mg | INTRAVENOUS | Status: DC | PRN
  Administered 2014-05-25: 1000 mg via INTRAVENOUS

## 2014-05-25 MED ORDER — ACETAMINOPHEN 160 MG/5ML PO SOLN: 650.0000 mg | Freq: Once | ORAL | Status: AC

## 2014-05-25 MED ORDER — DOCUSATE SODIUM 100 MG PO CAPS
200.0000 mg | ORAL_CAPSULE | Freq: Every day | ORAL | Status: DC
  Administered 2014-05-26 – 2014-06-02 (×4): 200 mg via ORAL
  Filled 2014-05-25 (×8): qty 2

## 2014-05-25 MED ORDER — DEXTROSE 5 % IV SOLN
0.0000 ug/min | INTRAVENOUS | Status: DC
  Filled 2014-05-25: qty 2

## 2014-05-25 MED ORDER — VANCOMYCIN HCL 1000 MG IV SOLR
INTRAVENOUS | Status: DC | PRN
  Administered 2014-05-25: 1000 mL

## 2014-05-25 MED ORDER — DOPAMINE-DEXTROSE 3.2-5 MG/ML-% IV SOLN
0.0000 ug/kg/min | INTRAVENOUS | Status: AC
  Administered 2014-05-25: 3 ug/kg/min via INTRAVENOUS

## 2014-05-25 MED ORDER — MORPHINE SULFATE 2 MG/ML IJ SOLN: 2.0000 mg | INTRAMUSCULAR | Status: DC | PRN

## 2014-05-25 MED ORDER — DEXTROSE 5 % IV SOLN
1.5000 g | INTRAVENOUS | Status: DC | PRN
  Administered 2014-05-25: 1.5 g via INTRAVENOUS
  Administered 2014-05-25: .75 g via INTRAVENOUS

## 2014-05-25 MED ORDER — PROTAMINE SULFATE 10 MG/ML IV SOLN
INTRAVENOUS | Status: AC
  Filled 2014-05-25: qty 5

## 2014-05-25 MED ORDER — MIDAZOLAM HCL 10 MG/2ML IJ SOLN
INTRAMUSCULAR | Status: AC
  Filled 2014-05-25: qty 2

## 2014-05-25 MED ORDER — SODIUM CHLORIDE 0.9 % IV SOLN
INTRAVENOUS | Status: AC
  Administered 2014-05-25: 69.8 mL/h via INTRAVENOUS

## 2014-05-25 MED ORDER — ROCURONIUM BROMIDE 100 MG/10ML IV SOLN
INTRAVENOUS | Status: DC | PRN
  Administered 2014-05-25: 70 mg via INTRAVENOUS
  Administered 2014-05-25 (×2): 50 mg via INTRAVENOUS
  Administered 2014-05-25: 30 mg via INTRAVENOUS

## 2014-05-25 MED ORDER — LACTATED RINGERS IV SOLN
INTRAVENOUS | Status: DC | PRN
  Administered 2014-05-25: 08:00:00 via INTRAVENOUS

## 2014-05-25 MED ORDER — ACETAMINOPHEN 650 MG RE SUPP
650.0000 mg | Freq: Once | RECTAL | Status: AC
  Administered 2014-05-25: 650 mg via RECTAL

## 2014-05-25 MED ORDER — ASPIRIN EC 325 MG PO TBEC
325.0000 mg | DELAYED_RELEASE_TABLET | Freq: Every day | ORAL | Status: DC
  Administered 2014-05-27 – 2014-05-30 (×4): 325 mg via ORAL
  Filled 2014-05-25 (×6): qty 1

## 2014-05-25 MED ORDER — NOREPINEPHRINE BITARTRATE 1 MG/ML IV SOLN
0.0000 ug/min | INTRAVENOUS | Status: DC
  Filled 2014-05-25: qty 4

## 2014-05-25 MED ORDER — CETYLPYRIDINIUM CHLORIDE 0.05 % MT LIQD
7.0000 mL | Freq: Four times a day (QID) | OROMUCOSAL | Status: DC
  Administered 2014-05-25 (×2): 7 mL via OROMUCOSAL

## 2014-05-25 MED ORDER — ARTIFICIAL TEARS OP OINT: TOPICAL_OINTMENT | OPHTHALMIC | Status: DC | PRN

## 2014-05-25 MED ORDER — MAGNESIUM SULFATE 4 GM/100ML IV SOLN
INTRAVENOUS | Status: AC
  Administered 2014-05-25: 4 g via INTRAVENOUS
  Filled 2014-05-25: qty 100

## 2014-05-25 MED ORDER — HEPARIN SODIUM (PORCINE) 1000 UNIT/ML IJ SOLN
INTRAMUSCULAR | Status: AC
Start: 2014-05-25 — End: 2014-05-25
  Filled 2014-05-25: qty 1

## 2014-05-25 MED ORDER — SODIUM CHLORIDE 0.9 % IV SOLN: INTRAVENOUS | Status: DC

## 2014-05-25 MED ORDER — MORPHINE SULFATE 2 MG/ML IJ SOLN
1.0000 mg | INTRAMUSCULAR | Status: DC | PRN
  Administered 2014-05-25: 2 mg via INTRAVENOUS
  Filled 2014-05-25: qty 1

## 2014-05-25 MED ORDER — CHLORHEXIDINE GLUCONATE 0.12 % MT SOLN
15.0000 mL | Freq: Two times a day (BID) | OROMUCOSAL | Status: DC
  Administered 2014-05-25: 15 mL via OROMUCOSAL
  Filled 2014-05-25: qty 15

## 2014-05-25 MED ORDER — PROTAMINE SULFATE 10 MG/ML IV SOLN
INTRAVENOUS | Status: DC | PRN
  Administered 2014-05-25: 350 mg via INTRAVENOUS

## 2014-05-25 MED ORDER — HEPARIN SODIUM (PORCINE) 1000 UNIT/ML IJ SOLN
INTRAMUSCULAR | Status: DC | PRN
  Administered 2014-05-25: 35000 [IU] via INTRAVENOUS

## 2014-05-25 MED ORDER — METOPROLOL TARTRATE 1 MG/ML IV SOLN: 2.5000 mg | INTRAVENOUS | Status: DC | PRN

## 2014-05-25 MED ORDER — POTASSIUM CHLORIDE 10 MEQ/50ML IV SOLN
10.0000 meq | INTRAVENOUS | Status: AC
Start: 2014-05-25 — End: 2014-05-25
  Administered 2014-05-25 (×3): 10 meq via INTRAVENOUS

## 2014-05-25 MED ORDER — SUCCINYLCHOLINE CHLORIDE 20 MG/ML IJ SOLN
INTRAMUSCULAR | Status: AC
  Filled 2014-05-25: qty 1

## 2014-05-25 MED ORDER — LACTATED RINGERS IV SOLN
INTRAVENOUS | Status: DC | PRN
  Administered 2014-05-25 (×2): via INTRAVENOUS

## 2014-05-25 MED ORDER — BISACODYL 5 MG PO TBEC
10.0000 mg | DELAYED_RELEASE_TABLET | Freq: Every day | ORAL | Status: DC
  Administered 2014-05-26 – 2014-05-28 (×3): 10 mg via ORAL
  Filled 2014-05-25 (×4): qty 2

## 2014-05-25 MED ORDER — FENTANYL CITRATE 0.05 MG/ML IJ SOLN
INTRAMUSCULAR | Status: AC
  Filled 2014-05-25: qty 5

## 2014-05-25 MED ORDER — PANTOPRAZOLE SODIUM 40 MG PO TBEC
40.0000 mg | DELAYED_RELEASE_TABLET | Freq: Every day | ORAL | Status: DC
  Administered 2014-05-27 – 2014-06-02 (×7): 40 mg via ORAL
  Filled 2014-05-25 (×5): qty 1

## 2014-05-25 MED ORDER — VANCOMYCIN HCL IN DEXTROSE 1-5 GM/200ML-% IV SOLN
1000.0000 mg | Freq: Once | INTRAVENOUS | Status: AC
  Administered 2014-05-25: 1000 mg via INTRAVENOUS
  Filled 2014-05-25: qty 200

## 2014-05-25 MED ORDER — MIDAZOLAM HCL 2 MG/2ML IJ SOLN: 2.0000 mg | INTRAMUSCULAR | Status: DC | PRN

## 2014-05-25 MED ORDER — OXYCODONE HCL 5 MG PO TABS
5.0000 mg | ORAL_TABLET | ORAL | Status: DC | PRN
  Administered 2014-05-26: 10 mg via ORAL
  Filled 2014-05-25: qty 2

## 2014-05-25 MED ORDER — LACTATED RINGERS IV SOLN
INTRAVENOUS | Status: DC | PRN
  Administered 2014-05-25: 07:00:00 via INTRAVENOUS

## 2014-05-25 MED ORDER — MILRINONE IN DEXTROSE 20 MG/100ML IV SOLN
0.0000 ug/kg/min | INTRAVENOUS | Status: DC
  Administered 2014-05-25: 0.25 ug/kg/min via INTRAVENOUS
  Filled 2014-05-25 (×2): qty 100

## 2014-05-25 MED ORDER — ALBUMIN HUMAN 5 % IV SOLN
250.0000 mL | INTRAVENOUS | Status: AC | PRN
  Administered 2014-05-25: 250 mL via INTRAVENOUS

## 2014-05-25 MED ORDER — SODIUM CHLORIDE 0.9 % IV SOLN: 250.0000 mL | INTRAVENOUS | Status: DC

## 2014-05-25 MED ORDER — VASOPRESSIN 20 UNIT/ML IV SOLN
INTRAVENOUS | Status: DC | PRN
  Administered 2014-05-25: 1 [IU] via INTRAVENOUS

## 2014-05-25 MED ORDER — SODIUM CHLORIDE 0.9 % IV SOLN
Freq: Once | INTRAVENOUS | Status: AC
  Administered 2014-05-25: 17:00:00 via INTRAVENOUS

## 2014-05-25 MED ORDER — LIDOCAINE HCL (CARDIAC) 20 MG/ML IV SOLN
INTRAVENOUS | Status: AC
  Filled 2014-05-25: qty 5

## 2014-05-25 MED ORDER — CALCIUM CHLORIDE 10 % IV SOLN
INTRAVENOUS | Status: DC | PRN
  Administered 2014-05-25 (×2): 500 mg via INTRAVENOUS
  Administered 2014-05-25: 300 mg via INTRAVENOUS

## 2014-05-25 MED ORDER — DEXMEDETOMIDINE HCL IN NACL 400 MCG/100ML IV SOLN
0.1000 ug/kg/h | INTRAVENOUS | Status: AC
  Administered 2014-05-25: 0.3 ug/kg/h via INTRAVENOUS

## 2014-05-25 MED ORDER — SODIUM CHLORIDE 0.9 % IJ SOLN: 3.0000 mL | INTRAMUSCULAR | Status: DC | PRN

## 2014-05-25 MED ORDER — EPHEDRINE SULFATE 50 MG/ML IJ SOLN
INTRAMUSCULAR | Status: AC
  Filled 2014-05-25: qty 1

## 2014-05-25 MED ORDER — LACTATED RINGERS IV SOLN: 500.0000 mL | Freq: Once | INTRAVENOUS | Status: AC | PRN

## 2014-05-25 MED ORDER — NITROGLYCERIN IN D5W 200-5 MCG/ML-% IV SOLN: 0.0000 ug/min | INTRAVENOUS | Status: DC

## 2014-05-25 MED ORDER — ACETAMINOPHEN 160 MG/5ML PO SOLN: 1000.0000 mg | Freq: Four times a day (QID) | ORAL | Status: DC

## 2014-05-25 MED ORDER — METOPROLOL TARTRATE 25 MG/10 ML ORAL SUSPENSION
12.5000 mg | Freq: Two times a day (BID) | ORAL | Status: DC
  Filled 2014-05-25 (×3): qty 5

## 2014-05-25 MED ORDER — PHENYLEPHRINE HCL 10 MG/ML IJ SOLN
30.0000 ug/min | INTRAVENOUS | Status: AC
  Administered 2014-05-25: 20 ug/min via INTRAVENOUS

## 2014-05-25 MED ORDER — THROMBIN 20000 UNITS EX SOLR
OROMUCOSAL | Status: DC | PRN
  Administered 2014-05-25 (×5): via TOPICAL

## 2014-05-25 MED ORDER — PROTAMINE SULFATE 10 MG/ML IV SOLN
INTRAVENOUS | Status: AC
  Filled 2014-05-25: qty 25

## 2014-05-25 MED ORDER — ARTIFICIAL TEARS OP OINT
TOPICAL_OINTMENT | OPHTHALMIC | Status: AC
  Filled 2014-05-25: qty 3.5

## 2014-05-25 MED ORDER — SODIUM CHLORIDE 0.9 % IJ SOLN
3.0000 mL | Freq: Two times a day (BID) | INTRAMUSCULAR | Status: DC
  Administered 2014-05-26 – 2014-05-30 (×9): 3 mL via INTRAVENOUS

## 2014-05-25 MED ORDER — PLASMA-LYTE 148 IV SOLN
INTRAVENOUS | Status: DC | PRN
  Administered 2014-05-25: 500 mL via INTRAVASCULAR

## 2014-05-25 MED ORDER — LIDOCAINE HCL (CARDIAC) 20 MG/ML IV SOLN
INTRAVENOUS | Status: DC | PRN
  Administered 2014-05-25: 10 mg via INTRAVENOUS

## 2014-05-25 MED ORDER — MIDAZOLAM HCL 5 MG/5ML IJ SOLN
INTRAMUSCULAR | Status: DC | PRN
  Administered 2014-05-25: 1 mg via INTRAVENOUS
  Administered 2014-05-25: 0.5 mg via INTRAVENOUS
  Administered 2014-05-25: 1 mg via INTRAVENOUS

## 2014-05-25 MED ORDER — DEXTROSE 5 % IV SOLN
1.5000 g | Freq: Two times a day (BID) | INTRAVENOUS | Status: AC
  Administered 2014-05-25 – 2014-05-27 (×4): 1.5 g via INTRAVENOUS
  Filled 2014-05-25 (×4): qty 1.5

## 2014-05-25 MED ORDER — ROCURONIUM BROMIDE 50 MG/5ML IV SOLN
INTRAVENOUS | Status: AC
  Filled 2014-05-25: qty 2

## 2014-05-25 MED ORDER — PROPOFOL 10 MG/ML IV BOLUS
INTRAVENOUS | Status: DC | PRN
  Administered 2014-05-25: 10 mg via INTRAVENOUS
  Administered 2014-05-25: 50 mg via INTRAVENOUS

## 2014-05-25 MED ORDER — ASPIRIN 81 MG PO CHEW: 324.0000 mg | CHEWABLE_TABLET | Freq: Every day | ORAL | Status: DC

## 2014-05-25 MED ORDER — SODIUM CHLORIDE 0.9 % IR SOLN
Status: DC | PRN
  Administered 2014-05-25: 3000 mL

## 2014-05-25 MED ORDER — DIFLUPREDNATE 0.05 % OP EMUL
1.0000 [drp] | Freq: Three times a day (TID) | OPHTHALMIC | Status: DC
  Administered 2014-05-26 – 2014-06-02 (×21): 1 [drp] via OPHTHALMIC
  Filled 2014-05-25: qty 5

## 2014-05-25 MED ORDER — DOPAMINE-DEXTROSE 3.2-5 MG/ML-% IV SOLN
0.0000 ug/kg/min | INTRAVENOUS | Status: DC
  Administered 2014-05-27: 3 ug/kg/min via INTRAVENOUS
  Filled 2014-05-25 (×2): qty 250

## 2014-05-25 MED ORDER — HEPARIN SODIUM (PORCINE) 1000 UNIT/ML IJ SOLN
INTRAMUSCULAR | Status: AC
  Filled 2014-05-25: qty 1

## 2014-05-25 MED ORDER — LACTATED RINGERS IV SOLN: INTRAVENOUS | Status: DC

## 2014-05-25 MED ORDER — DEXTROSE 5 % IV SOLN
0.0000 ug/min | INTRAVENOUS | Status: DC
  Administered 2014-05-25: 2 ug/min via INTRAVENOUS
  Filled 2014-05-25: qty 4

## 2014-05-25 MED ORDER — DEXMEDETOMIDINE HCL IN NACL 200 MCG/50ML IV SOLN
0.0000 ug/kg/h | INTRAVENOUS | Status: DC
  Administered 2014-05-25: 0.5 ug/kg/h via INTRAVENOUS

## 2014-05-25 MED ORDER — ONDANSETRON HCL 4 MG/2ML IJ SOLN
4.0000 mg | Freq: Four times a day (QID) | INTRAMUSCULAR | Status: DC | PRN
  Administered 2014-05-26 – 2014-05-31 (×2): 4 mg via INTRAVENOUS
  Filled 2014-05-25 (×3): qty 2

## 2014-05-25 MED ORDER — OFLOXACIN 0.3 % OP SOLN
1.0000 [drp] | Freq: Four times a day (QID) | OPHTHALMIC | Status: DC
  Administered 2014-05-25 – 2014-05-27 (×8): 1 [drp] via OPHTHALMIC
  Filled 2014-05-25: qty 5

## 2014-05-25 MED ORDER — FENTANYL CITRATE 0.05 MG/ML IJ SOLN
INTRAMUSCULAR | Status: DC | PRN
  Administered 2014-05-25: 100 ug via INTRAVENOUS
  Administered 2014-05-25: 250 ug via INTRAVENOUS
  Administered 2014-05-25 (×3): 100 ug via INTRAVENOUS
  Administered 2014-05-25: 50 ug via INTRAVENOUS
  Administered 2014-05-25: 150 ug via INTRAVENOUS

## 2014-05-25 MED ORDER — INSULIN REGULAR HUMAN 100 UNIT/ML IJ SOLN
INTRAMUSCULAR | Status: DC
  Administered 2014-05-26: 2.8 [IU]/h via INTRAVENOUS
  Filled 2014-05-25 (×2): qty 2.5

## 2014-05-25 MED ORDER — ROCURONIUM BROMIDE 50 MG/5ML IV SOLN
INTRAVENOUS | Status: AC
  Filled 2014-05-25: qty 3

## 2014-05-25 MED ORDER — NITROGLYCERIN IN D5W 200-5 MCG/ML-% IV SOLN: 2.0000 ug/min | INTRAVENOUS | Status: DC

## 2014-05-25 MED ORDER — ALBUMIN HUMAN 5 % IV SOLN
INTRAVENOUS | Status: DC | PRN
  Administered 2014-05-25 (×6): via INTRAVENOUS

## 2014-05-25 MED ORDER — CALCIUM CHLORIDE 10 % IV SOLN
INTRAVENOUS | Status: AC
  Filled 2014-05-25: qty 20

## 2014-05-25 MED ORDER — SODIUM CHLORIDE 0.45 % IV SOLN
INTRAVENOUS | Status: DC
  Administered 2014-05-25: 15:00:00 via INTRAVENOUS

## 2014-05-25 MED ORDER — EPHEDRINE SULFATE 50 MG/ML IJ SOLN
INTRAMUSCULAR | Status: DC | PRN
  Administered 2014-05-25: 10 mg via INTRAVENOUS
  Administered 2014-05-25: 5 mg via INTRAVENOUS
  Administered 2014-05-25: 10 mg via INTRAVENOUS
  Administered 2014-05-25: 5 mg via INTRAVENOUS
  Administered 2014-05-25: 10 mg via INTRAVENOUS
  Administered 2014-05-25: 25 mg via INTRAVENOUS
  Administered 2014-05-25: 5 mg via INTRAVENOUS

## 2014-05-25 MED ORDER — METOPROLOL TARTRATE 12.5 MG HALF TABLET
12.5000 mg | ORAL_TABLET | Freq: Two times a day (BID) | ORAL | Status: DC
  Administered 2014-05-26 – 2014-05-27 (×2): 12.5 mg via ORAL
  Filled 2014-05-25 (×5): qty 1

## 2014-05-25 MED ORDER — POTASSIUM CHLORIDE 10 MEQ/50ML IV SOLN
10.0000 meq | INTRAVENOUS | Status: AC
  Administered 2014-05-25 (×2): 10 meq via INTRAVENOUS

## 2014-05-25 SURGICAL SUPPLY — 158 items
ADAPTER CARDIO PERF ANTE/RETRO (ADAPTER) ×3 IMPLANT
APPLICATOR COTTON TIP 6IN STRL (MISCELLANEOUS) IMPLANT
APPLIER CLIP 9.375 MED OPEN (MISCELLANEOUS)
APPLIER CLIP 9.375 SM OPEN (CLIP)
ATTRACTOMAT 16X20 MAGNETIC DRP (DRAPES) IMPLANT
BAG DECANTER FOR FLEXI CONT (MISCELLANEOUS) ×6 IMPLANT
BANDAGE ELASTIC 4 VELCRO ST LF (GAUZE/BANDAGES/DRESSINGS) ×3 IMPLANT
BANDAGE ELASTIC 6 VELCRO ST LF (GAUZE/BANDAGES/DRESSINGS) ×3 IMPLANT
BASKET HEART (ORDER IN 25'S) (MISCELLANEOUS) ×1
BASKET HEART (ORDER IN 25S) (MISCELLANEOUS) ×2 IMPLANT
BENZOIN TINCTURE PRP APPL 2/3 (GAUZE/BANDAGES/DRESSINGS) ×3 IMPLANT
BLADE STERNUM SYSTEM 6 (BLADE) ×3 IMPLANT
BLADE SURG 11 STRL SS (BLADE) ×3 IMPLANT
BLADE SURG ROTATE 9660 (MISCELLANEOUS) ×3 IMPLANT
BNDG GAUZE ELAST 4 BULKY (GAUZE/BANDAGES/DRESSINGS) ×3 IMPLANT
CANISTER SUCTION 2500CC (MISCELLANEOUS) ×3 IMPLANT
CANN PRFSN 3/8X14X24FR PCFC (MISCELLANEOUS)
CANN PRFSN 3/8XCNCT ST RT ANG (MISCELLANEOUS)
CANNULA EZ GLIDE AORTIC 21FR (CANNULA) ×3 IMPLANT
CANNULA FEM VENOUS REMOTE 22FR (CANNULA) ×3 IMPLANT
CANNULA GUNDRY RCSP 15FR (MISCELLANEOUS) ×3 IMPLANT
CANNULA PRFSN 3/8X14X24FR PCFC (MISCELLANEOUS) IMPLANT
CANNULA PRFSN 3/8XCNCT RT ANG (MISCELLANEOUS) IMPLANT
CANNULA VEN MTL TIP RT (MISCELLANEOUS)
CANNULA VENNOUS METAL TIP 20FR (CANNULA) ×3 IMPLANT
CANNULA VENOUS LOW PROF 34X46 (CANNULA) IMPLANT
CARDIAC SUCTION (MISCELLANEOUS) ×3 IMPLANT
CATH CPB KIT OWEN (MISCELLANEOUS) ×3 IMPLANT
CATH FOLEY 2WAY SLVR  5CC 14FR (CATHETERS)
CATH FOLEY 2WAY SLVR 5CC 14FR (CATHETERS) IMPLANT
CATH THORACIC 28FR (CATHETERS) IMPLANT
CATH THORACIC 28FR RT ANG (CATHETERS) IMPLANT
CATH THORACIC 36FR (CATHETERS) ×3 IMPLANT
CATH THORACIC 36FR RT ANG (CATHETERS) IMPLANT
CLIP APPLIE 9.375 MED OPEN (MISCELLANEOUS) IMPLANT
CLIP APPLIE 9.375 SM OPEN (CLIP) IMPLANT
CLIP FOGARTY SPRING 6M (CLIP) IMPLANT
CLIP RETRACTION 3.0MM CORONARY (MISCELLANEOUS) ×3 IMPLANT
CLIP TI MEDIUM 24 (CLIP) IMPLANT
CLIP TI WIDE RED SMALL 24 (CLIP) ×9 IMPLANT
CLSR STERI-STRIP ANTIMIC 1/2X4 (GAUZE/BANDAGES/DRESSINGS) ×3 IMPLANT
CONN 1/2X1/2X1/2  BEN (MISCELLANEOUS)
CONN 1/2X1/2X1/2 BEN (MISCELLANEOUS) IMPLANT
CONN 3/8X1/2 ST GISH (MISCELLANEOUS) IMPLANT
CONN Y 3/8X3/8X3/8  BEN (MISCELLANEOUS)
CONN Y 3/8X3/8X3/8 BEN (MISCELLANEOUS) IMPLANT
CONNECTOR 1/2X3/8X1/2 3 WAY (MISCELLANEOUS) ×1
CONNECTOR 1/2X3/8X1/2 3WAY (MISCELLANEOUS) ×2 IMPLANT
COVER SURGICAL LIGHT HANDLE (MISCELLANEOUS) IMPLANT
CRADLE DONUT ADULT HEAD (MISCELLANEOUS) ×3 IMPLANT
DEVICE SUT CK QUICK LOAD MINI (Prosthesis & Implant Heart) ×6 IMPLANT
DRAIN CHANNEL 32F RND 10.7 FF (WOUND CARE) ×6 IMPLANT
DRAPE BILATERAL SPLIT (DRAPES) IMPLANT
DRAPE CARDIOVASCULAR INCISE (DRAPES) ×1
DRAPE CV SPLIT W-CLR ANES SCRN (DRAPES) IMPLANT
DRAPE INCISE IOBAN 66X45 STRL (DRAPES) ×3 IMPLANT
DRAPE SLUSH/WARMER DISC (DRAPES) ×3 IMPLANT
DRAPE SRG 135X102X78XABS (DRAPES) ×2 IMPLANT
DRSG COVADERM 4X14 (GAUZE/BANDAGES/DRESSINGS) ×3 IMPLANT
ELECT BLADE 4.0 EZ CLEAN MEGAD (MISCELLANEOUS) ×3
ELECT REM PT RETURN 9FT ADLT (ELECTROSURGICAL) ×6
ELECTRODE BLDE 4.0 EZ CLN MEGD (MISCELLANEOUS) ×2 IMPLANT
ELECTRODE REM PT RTRN 9FT ADLT (ELECTROSURGICAL) ×4 IMPLANT
GAUZE SPONGE 4X4 12PLY STRL (GAUZE/BANDAGES/DRESSINGS) ×6 IMPLANT
GLOVE BIO SURGEON STRL SZ 6 (GLOVE) ×9 IMPLANT
GLOVE BIO SURGEON STRL SZ 6.5 (GLOVE) IMPLANT
GLOVE BIO SURGEON STRL SZ7 (GLOVE) IMPLANT
GLOVE BIO SURGEON STRL SZ7.5 (GLOVE) IMPLANT
GLOVE BIOGEL PI IND STRL 6 (GLOVE) ×8 IMPLANT
GLOVE BIOGEL PI IND STRL 6.5 (GLOVE) IMPLANT
GLOVE BIOGEL PI IND STRL 7.0 (GLOVE) ×8 IMPLANT
GLOVE BIOGEL PI INDICATOR 6 (GLOVE) ×4
GLOVE BIOGEL PI INDICATOR 6.5 (GLOVE)
GLOVE BIOGEL PI INDICATOR 7.0 (GLOVE) ×4
GLOVE EUDERMIC 7 POWDERFREE (GLOVE) IMPLANT
GLOVE ORTHO TXT STRL SZ7.5 (GLOVE) ×6 IMPLANT
GOWN STRL REUS W/ TWL LRG LVL3 (GOWN DISPOSABLE) ×14 IMPLANT
GOWN STRL REUS W/TWL LRG LVL3 (GOWN DISPOSABLE) ×7
HEMOSTAT POWDER SURGIFOAM 1G (HEMOSTASIS) ×9 IMPLANT
INSERT FOGARTY 61MM (MISCELLANEOUS) IMPLANT
INSERT FOGARTY XLG (MISCELLANEOUS) ×3 IMPLANT
IV NS IRRIG 3000ML ARTHROMATIC (IV SOLUTION) ×3 IMPLANT
KIT BASIN OR (CUSTOM PROCEDURE TRAY) ×3 IMPLANT
KIT DILATOR VASC 18G NDL (KITS) ×3 IMPLANT
KIT ROOM TURNOVER OR (KITS) ×3 IMPLANT
KIT SUCTION CATH 14FR (SUCTIONS) ×12 IMPLANT
KIT SUT CK MINI COMBO 4X17 (Prosthesis & Implant Heart) ×3 IMPLANT
KIT VASOVIEW W/TROCAR VH 2000 (KITS) ×3 IMPLANT
LEAD PACING MYOCARDI (MISCELLANEOUS) ×3 IMPLANT
LOOP VESSEL SUPERMAXI WHITE (MISCELLANEOUS) ×3 IMPLANT
MARKER GRAFT CORONARY BYPASS (MISCELLANEOUS) ×9 IMPLANT
NS IRRIG 1000ML POUR BTL (IV SOLUTION) ×18 IMPLANT
PACK OPEN HEART (CUSTOM PROCEDURE TRAY) ×3 IMPLANT
PAD ARMBOARD 7.5X6 YLW CONV (MISCELLANEOUS) ×3 IMPLANT
PAD ELECT DEFIB RADIOL ZOLL (MISCELLANEOUS) ×3 IMPLANT
PENCIL BUTTON HOLSTER BLD 10FT (ELECTRODE) ×3 IMPLANT
PUNCH AORTIC ROTATE 4.0MM (MISCELLANEOUS) ×3 IMPLANT
PUNCH AORTIC ROTATE 4.5MM 8IN (MISCELLANEOUS) IMPLANT
PUNCH AORTIC ROTATE 5MM 8IN (MISCELLANEOUS) IMPLANT
RING RECHORD MEMO 3D 28MM (Vascular Products) ×3 IMPLANT
SET IRRIG TUBING LAPAROSCOPIC (IRRIGATION / IRRIGATOR) ×3 IMPLANT
SOLUTION ANTI FOG 6CC (MISCELLANEOUS) ×3 IMPLANT
SPONGE GAUZE 4X4 12PLY STER LF (GAUZE/BANDAGES/DRESSINGS) ×6 IMPLANT
SPONGE LAP 18X18 X RAY DECT (DISPOSABLE) ×6 IMPLANT
SPONGE LAP 4X18 X RAY DECT (DISPOSABLE) ×3 IMPLANT
SUCKER INTRACARDIAC WEIGHTED (SUCKER) ×3 IMPLANT
SUT BONE WAX W31G (SUTURE) ×3 IMPLANT
SUT ETHIBOND (SUTURE) ×12 IMPLANT
SUT ETHIBOND 2 0 SH (SUTURE) IMPLANT
SUT ETHIBOND 2 0 SH 36X2 (SUTURE) IMPLANT
SUT ETHIBOND 2 0 V4 (SUTURE) IMPLANT
SUT ETHIBOND 2 0V4 GREEN (SUTURE) IMPLANT
SUT ETHIBOND 2-0 RB-1 WHT (SUTURE) ×9 IMPLANT
SUT ETHIBOND 4 0 TF (SUTURE) IMPLANT
SUT ETHIBOND 5 0 C 1 30 (SUTURE) IMPLANT
SUT ETHIBOND X763 2 0 SH 1 (SUTURE) ×6 IMPLANT
SUT GORETEX CV-5THC-13 36IN (SUTURE) ×30 IMPLANT
SUT GORETEX CV4 TH-18 (SUTURE) ×6 IMPLANT
SUT MNCRL AB 3-0 PS2 18 (SUTURE) ×6 IMPLANT
SUT MNCRL AB 4-0 PS2 18 (SUTURE) ×6 IMPLANT
SUT PDS AB 1 CTX 36 (SUTURE) ×6 IMPLANT
SUT PROLENE 2 0 SH DA (SUTURE) IMPLANT
SUT PROLENE 3 0 SH 1 (SUTURE) IMPLANT
SUT PROLENE 3 0 SH DA (SUTURE) ×9 IMPLANT
SUT PROLENE 3 0 SH1 36 (SUTURE) IMPLANT
SUT PROLENE 4 0 RB 1 (SUTURE) ×6
SUT PROLENE 4 0 SH DA (SUTURE) ×12 IMPLANT
SUT PROLENE 4-0 RB1 .5 CRCL 36 (SUTURE) ×12 IMPLANT
SUT PROLENE 5 0 C 1 36 (SUTURE) IMPLANT
SUT PROLENE 6 0 C 1 30 (SUTURE) ×12 IMPLANT
SUT PROLENE 7.0 RB 3 (SUTURE) ×6 IMPLANT
SUT PROLENE 8 0 BV175 6 (SUTURE) IMPLANT
SUT PROLENE BLUE 7 0 (SUTURE) ×3 IMPLANT
SUT PROLENE POLY MONO (SUTURE) IMPLANT
SUT SILK  1 MH (SUTURE) ×3
SUT SILK 1 MH (SUTURE) ×6 IMPLANT
SUT STEEL 6MS V (SUTURE) IMPLANT
SUT STEEL STERNAL CCS#1 18IN (SUTURE) IMPLANT
SUT STEEL SZ 6 DBL 3X14 BALL (SUTURE) ×9 IMPLANT
SUT VIC AB 1 CTX 36 (SUTURE)
SUT VIC AB 1 CTX36XBRD ANBCTR (SUTURE) IMPLANT
SUT VIC AB 2-0 CT1 27 (SUTURE) ×2
SUT VIC AB 2-0 CT1 TAPERPNT 27 (SUTURE) ×4 IMPLANT
SUT VIC AB 2-0 CTX 27 (SUTURE) IMPLANT
SUT VIC AB 3-0 SH 27 (SUTURE)
SUT VIC AB 3-0 SH 27X BRD (SUTURE) IMPLANT
SUT VIC AB 3-0 X1 27 (SUTURE) IMPLANT
SUT VICRYL 4-0 PS2 18IN ABS (SUTURE) IMPLANT
SUTURE E-PAK OPEN HEART (SUTURE) ×3 IMPLANT
SYSTEM SAHARA CHEST DRAIN ATS (WOUND CARE) ×3 IMPLANT
TAPE CLOTH SURG 4X10 WHT LF (GAUZE/BANDAGES/DRESSINGS) ×6 IMPLANT
TOWEL OR 17X24 6PK STRL BLUE (TOWEL DISPOSABLE) ×6 IMPLANT
TOWEL OR 17X26 10 PK STRL BLUE (TOWEL DISPOSABLE) ×6 IMPLANT
TRAY FOLEY IC TEMP SENS 16FR (CATHETERS) ×3 IMPLANT
TUBING INSUFFLATION (TUBING) ×3 IMPLANT
TUBING INSUFFLATION 10FT LAP (TUBING) IMPLANT
UNDERPAD 30X30 INCONTINENT (UNDERPADS AND DIAPERS) ×3 IMPLANT
WATER STERILE IRR 1000ML POUR (IV SOLUTION) ×6 IMPLANT

## 2014-05-25 NOTE — Transfer of Care (Signed)
Immediate Anesthesia Transfer of Care Note  Patient: Trevor Aguilar  Procedure(s) Performed: Procedure(s): MITRAL VALVE REPAIR (MVR) WITH CLOSURE OF PATENT FORAMEN OVALE (N/A) CORONARY ARTERY BYPASS GRAFTING (CABG)TIMES 2 USING LEFT INTERNAL MAMMARY AND LEFT SAPHENOUS VEIN HARVESTED ENDOSCOPICALLY (N/A) TRANSESOPHAGEAL ECHOCARDIOGRAM (TEE) (N/A)  Patient Location: SICU  Anesthesia Type:General  Level of Consciousness: sedated, unresponsive and Patient remains intubated per anesthesia plan  Airway & Oxygen Therapy: Patient remains intubated per anesthesia plan and Patient placed on Ventilator (see vital sign flow sheet for setting)  Post-op Assessment: Report given to PACU RN and Post -op Vital signs reviewed and stable  Post vital signs: Reviewed and stable  Complications: No apparent anesthesia complications

## 2014-05-25 NOTE — Progress Notes (Signed)
CRITICAL VALUE ALERT  Critical value received:  Hgb = 6.7  Date of notification:  05/25/2014  Time of notification:  9:45 PM  Critical value read back:Yes.    Nurse who received alert:  Wilhelmina McardleLauren Javarri Segal, RN  MD notified (1st page):  Dr. Cornelius Moraswen - Order parameters do not call unless Hgb less than 6.0.  Previously received orders for blood transfusion.  Time of first page:    MD notified (2nd page):  Time of second page:  Responding MD:    Time MD responded:

## 2014-05-25 NOTE — Anesthesia Preprocedure Evaluation (Signed)
Anesthesia Evaluation  Patient identified by MRN, date of birth, ID band Patient awake    Reviewed: Allergy & Precautions, H&P , NPO status , Patient's Chart, lab work & pertinent test results  History of Anesthesia Complications Negative for: history of anesthetic complications  Airway Mallampati: II  TM Distance: <3 FB Neck ROM: Full    Dental  (+) Missing, Chipped,    Pulmonary shortness of breath and with exertion, pneumonia -, resolved, former smoker,          Cardiovascular hypertension, Pt. on medications + CAD, + Peripheral Vascular Disease and +CHF + Valvular Problems/Murmurs MR and MVP     Neuro/Psych negative neurological ROS  negative psych ROS   GI/Hepatic negative GI ROS, Neg liver ROS,   Endo/Other  negative endocrine ROS  Renal/GU negative Renal ROS     Musculoskeletal   Abdominal   Peds  Hematology negative hematology ROS (+)   Anesthesia Other Findings   Reproductive/Obstetrics                             Anesthesia Physical Anesthesia Plan  ASA: IV  Anesthesia Plan: General   Post-op Pain Management:    Induction: Intravenous  Airway Management Planned: Oral ETT  Additional Equipment: 3D TEE, PA Cath, CVP and Arterial line  Intra-op Plan:   Post-operative Plan: Post-operative intubation/ventilation  Informed Consent: I have reviewed the patients History and Physical, chart, labs and discussed the procedure including the risks, benefits and alternatives for the proposed anesthesia with the patient or authorized representative who has indicated his/her understanding and acceptance.   Dental advisory given  Plan Discussed with:   Anesthesia Plan Comments:         Anesthesia Quick Evaluation

## 2014-05-25 NOTE — Progress Notes (Signed)
NIF -20cmH20, and VC > 10cc/kg (0.950L).

## 2014-05-25 NOTE — Progress Notes (Signed)
Patient ID: Trevor Aguilar, male   DOB: Dec 22, 1935, 78 y.o.   MRN: [id number]   SICU Evening Rounds:   Hemodynamically stable  CI = 2.8 on dopamine and milrinone  Has started to wake up on vent.   Urine output good  CT output 100 per hr total  CBC    Component Value Date/Time   WBC 23.6* 05/25/2014 1515   RBC 2.44* 05/25/2014 1515   HGB 8.2* 05/25/2014 1526   HCT 24.0* 05/25/2014 1526   PLT 83* 05/25/2014 1515   MCV 93.0 05/25/2014 1515   MCH 32.0 05/25/2014 1515   MCHC 34.4 05/25/2014 1515   RDW 14.8 05/25/2014 1515   LYMPHSABS 2.0 07/23/2012 1615   MONOABS 2.0* 07/23/2012 1615   EOSABS 0.4 07/23/2012 1615   BASOSABS 0.0 07/23/2012 1615     BMET    Component Value Date/Time   NA 140 05/25/2014 1526   K 2.9* 05/25/2014 1526   CL 96 05/25/2014 1333   CO2 25 05/23/2014 1436   GLUCOSE 162* 05/25/2014 1526   BUN 18 05/25/2014 1333   CREATININE 1.10 05/25/2014 1333   CALCIUM 10.2 05/23/2014 1436   GFRNONAA 46* 05/23/2014 1436   GFRAA 53* 05/23/2014 1436     A/P:  Stable postop course. Continue current plans. Received 1 unit prbc's and a second one has been ordered.

## 2014-05-25 NOTE — OR Nursing (Signed)
2nd call to SICU 1435

## 2014-05-25 NOTE — Brief Op Note (Addendum)
05/25/2014  12:48 PM  PATIENT:  Trevor Aguilar  78 y.o. male  PRE-OPERATIVE DIAGNOSIS:  Severe Mitral Regurgitation, Coronary Artery Disease  POST-OPERATIVE DIAGNOSIS:  Severe Mitral Regurgitation, Coronary Artery Disease  PROCEDURE:   MITRAL VALVE REPAIR   Complex valvuloplasty including triangular resection posterior leaflet  Goretex neochord x1  28 mm Sorin Memo 3D Rechord annuloplasty ring CLOSURE OF PATENT FORAMEN OVALE CORONARY ARTERY BYPASS GRAFTING x 2   LIMA-LAD  SVG-dRCA ENDOSCOPIC VEIN HARVEST BILATERAL THIGHS  SURGEON:    Purcell Nailslarence H Owen, MD  ASSISTANTS:  Coral CeoGina Collins, PA-C  ANESTHESIA:   Corky Soxhris Moser, MD  CROSSCLAMP TIME:   157'  CARDIOPULMONARY BYPASS TIME: 181'  FINDINGS:  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  MV Stenosis: No mitral valve stenosis.  MV Disease Functional Class: MV Disease Functional Class: Type II.  Etiology (Choose at least one and up to five): Degenerative.  MV Lesions (Choose at least one): Leaflet prolapse, posterior.    Mitral Valve Procedure  Mitral Valve Procedure Performed:  Repair: Annuloplasty., Leaflet Resection. Resection Type:Triangular. Mitral Leaflet Resection Location: Posterior., Sliding Plasty. and [place]rods. Number of Neochords Inserted: 1  Implant: Annuloplasty Device: Implant model number [id number], Size 28, Unique Device Identifier [id number].  COMPLICATIONS: None  BASELINE WEIGHT: 94 kg  PATIENT DISPOSITION:   TO SICU IN STABLE CONDITION  OWEN,CLARENCE H 05/25/2014 2:51 PM

## 2014-05-25 NOTE — H&P (Signed)
[address]       [name] [address]             [phone]          CARDIOTHORACIC SURGERY HISTORY AND PHYSICAL EXAM  Referring Provider is Pamella Pert, MD PCP is Eartha Inch, MD  Chief Complaint  Patient presents with  . Mitral Regurgitation    Surgical eval, TEE 05/11/14, Cardiac Cath 05/12/14, ECHO 05/02/14, Carotid duplex 05/16/14    HPI:  Patient is a 78 year old white male from [facility] referred by Dr. Jacinto Halim surgical treatment of severe symptomatic mitral regurgitation. The patient has history of hypertension, hyperlipidemia, peripheral arterial disease and multi-focal PVCs without any previous history of coronary artery disease, heart murmur or congestive heart failure. Approximately 2 years ago he saw Dr. Jacinto Halim and underwent a fairly complete cardiac workup to evaluate irregular heart rhythm. He was noted to have frequent PVCs. Transthoracic echocardiogram performed at that time was reportedly normal. The patient additionally had a nuclear stress test at that time that was felt to be low risk. Approximately a year and a half ago the patient began to experience slow functional decline that initially began with severe problems with his eyesight because of cataracts and glaucoma. His physical activity decreased. He began to experience some progressive exertional fatigue and shortness of breath. He was recently seen by a physician assistant at his primary care physician's office and noted to have a loud murmur on physical exam. He was referred back to Dr. Jacinto Halim who performed a transthoracic echocardiogram in the office on 05/02/2014. By report this echocardiogram demonstrated the presence of mildly depressed left ventricular systolic function with ejection fraction estimated 51%. There was mitral valve prolapse with a flail segment of the posterior leaflet and severe mitral regurgitation. There was moderate tricuspid regurgitation and  moderate pulmonary hypertension. At that time the patient was started on metoprolol and losartan. Metoprolol was later discontinued because of acute exacerbation of shortness of breath with leg swelling and leg pain. Lasix has been added recently for treatment of worsening shortness of breath and lower extremity swelling. He was recently started on amiodarone. The patient underwent transesophageal echocardiogram on 05/11/2014. This confirmed the presence of flail segment of the posterior leaflet with severe mitral regurgitation. Left and right heart catheterization was performed 05/12/2014. This was reported to demonstrate severe single-vessel coronary artery disease with long segment 80-85% stenosis of the proximal left anterior descending coronary artery. There was severe pulmonary hypertension. The patient was referred for surgical consultation.  The patient is married and lives locally in [place] with his wife. 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 The patient states that over the past year and a half's ability to work has decreased precipitously. This initially developed because of poor eyesight. The patient later began to experience exertional fatigue and some shortness of breath. The patient's symptoms of exertional shortness of breath had become acutely worse over the past 2 weeks. He now gets short of breath with minimal activity and occasionally at rest. He cannot lie flat in bed. He has had recent acute exacerbation of lower extremity swelling. He denies any chest pain or chest tightness either with activity or rest. He has a  long history of irregular heart rhythm without palpitations, dizzy spells, or syncope. The patient also  has recently had problems with skin breakdown over his buttocks for which he has been treated with topical creams at his primary care physician's office.   Past Medical History  Diagnosis Date  . Umbilical hernia   . Nonspecific abnormal electrocardiogram (ECG) (EKG)     Pt states he had a cardiac workup and passed all tests (Echo and Stress)  . Hearing loss     bilateral hearing aids  . Bilateral cataracts   . High cholesterol   . Glaucoma   . Pneumonia 1967  . Hypertension   . History of skull fracture 1978    lower skull  . Coronary artery disease 05/12/2014    High grade proximal LAD stenosis and moderate RCA stenosis  . Pulmonary hypertension 05/12/2014  . Tricuspid regurgitation 05/02/2014    Moderate TR by transthoracic ECHO  . Mitral regurgitation 05/12/2014    Mitral valve prolapse with flail segment of posterior leaflet and severe mitral regurgitation   . Aortic valve sclerosis 05/11/2014    Aortic valve sclerosis w/out stenosis  . Acute on chronic diastolic congestive heart failure 05/18/2014  . Chronic diastolic congestive heart failure 05/18/2014  . Heart murmur   . Shortness of breath dyspnea     with ambulation  . Rash, skin Dec 2015    near buttocks; PCP aware     Past Surgical History  Procedure Laterality Date  . Tonsillectomy    . Facial fracture surgery  1970's  . Colonoscopy    . Trabeculectomy Left 03/01/2014    Procedure: TRABECULECTOMY;  Surgeon: Chalmers Guestoy Whitaker, MD;  [facility];  Service: Ophthalmology;  Laterality: Left;  . Cataract extraction w/phaco Left 03/01/2014    Procedure: CATARACT EXTRACTION PHACO AND INTRAOCULAR LENS PLACEMENT (IOC);  Surgeon: Chalmers Guestoy Whitaker, MD;  [facility];  Service: Ophthalmology;  Laterality: Left;  . Tee without cardioversion N/A 05/11/2014    Procedure: TRANSESOPHAGEAL ECHOCARDIOGRAM (TEE);  Surgeon: Pamella PertJagadeesh R Ganji, MD;  [facility];  Service: Cardiovascular;  Laterality: N/A;  . Left and right heart  catheterization with coronary angiogram N/A 05/12/2014    Procedure: LEFT AND RIGHT HEART CATHETERIZATION WITH CORONARY ANGIOGRAM;  Surgeon: Pamella PertJagadeesh R Ganji, MD;  [facility];  Service: Cardiovascular;  Laterality: N/A;  . Eye surgery Right 05/14/12    cataract removal with lens implant  . Cardiac catheterization      Family History  Problem Relation Age of Onset  . Cancer Mother     breast  . Alcohol abuse Father   . Cataracts Father   . Heart disease Father     angina  . Arthritis Sister     Social History History  Substance Use Topics  . Smoking status: Former Smoker    Quit date: 03/01/1983  . Smokeless tobacco: Never Used  . Alcohol Use: 10.5 oz/week    21 Not specified per week     Comment: 2 - 4 glasses of scotch daily    Prior to Admission medications   Medication Sig Start Date End Date Taking? Authorizing Provider  amiodarone (PACERONE) 400 MG tablet Take 1 tablet (400 mg total) by mouth 3 (three) times daily. 05/11/14  Yes Pamella PertJagadeesh R Ganji, MD  aspirin 81 MG tablet Take 81 mg by mouth daily.   Yes Historical Provider, MD  Brinzolamide-Brimonidine [facility](SIMBRINZA) 1-0.2 % SUSP Place 1 drop into the right eye 3 (three) times daily.  Yes Historical Provider, MD  chlorthalidone (HYGROTON) 25 MG tablet Take 12.5 mg by mouth daily.   Yes Historical Provider, MD  diclofenac (VOLTAREN) 75 MG EC tablet Take 75 mg by mouth 2 (two) times daily.   Yes Historical Provider, MD  Difluprednate (DUREZOL) 0.05 % EMUL Place 1 drop into the left eye 3 (three) times daily.   Yes Historical Provider, MD  fish oil-omega-3 fatty acids 1000 MG capsule Take 1 g by mouth daily.   Yes Historical Provider, MD  furosemide (LASIX) 40 MG tablet Take 40 mg by mouth daily. For 3 days   Yes Historical Provider, MD  ofloxacin (OCUFLOX) 0.3 % ophthalmic solution Place 1 drop into the right eye 4 (four) times daily.   Yes Historical Provider, MD  potassium chloride (K-DUR) 10 MEQ tablet Take 10  mEq by mouth daily.   Yes Historical Provider, MD  simvastatin (ZOCOR) 20 MG tablet Take 20 mg by mouth daily.  02/24/12  Yes Historical Provider, MD  tiZANidine (ZANAFLEX) 4 MG tablet Take 4 mg by mouth at bedtime.   Yes Historical Provider, MD  Vitamin D, Ergocalciferol, (DRISDOL) 50000 UNITS CAPS capsule Take 50,000 Units by mouth every 7 (seven) days. Monday   Yes Historical Provider, MD    Allergies  Allergen Reactions  . Metoprolol Tartrate Shortness Of Breath    ALSO HAD LEG PAIN AND SWELLING  . Restoril [Temazepam] Other (See Comments)    BAD DREAMS     Review of Systems:  General:decreased appetite, decreased energy, no weight gain, no weight loss, no fever Cardiac:no chest pain with exertion, no chest pain at rest, + SOB with minimal exertion, occasional resting SOB, no PND, + orthopnea, no palpitations, + arrhythmia, no atrial fibrillation, + LE edema, no dizzy spells, no syncope Respiratory:+ shortness of breath, no home oxygen, no productive cough, no dry cough, no bronchitis, no wheezing, no hemoptysis, no asthma, no pain with inspiration or cough, no sleep apnea, no CPAP at night GI:no difficulty swallowing, no reflux, no frequent heartburn, no hiatal hernia, no abdominal pain, no constipation, no diarrhea, no hematochezia, no hematemesis, no melena GU:no dysuria, + frequency, no urinary tract infection, no hematuria, no enlarged prostate, no kidney stones, no kidney disease Vascular:no pain suggestive of claudication, no pain in feet, no leg cramps, no varicose veins, no DVT, no non-healing foot ulcer Neuro:no stroke, no TIA's, no seizures, no headaches, no temporary blindness one eye, no slurred speech, no peripheral neuropathy,  no chronic pain, no instability of gait, denies memory/cognitive dysfunction Musculoskeletal:no arthritis, no joint swelling, no myalgias, no difficulty walking, normal mobility  Skin:+ rash, no itching, no skin infections, + pressure sores or ulcerations on buttocks Psych:no anxiety, no depression, no nervousness, + unusual recent stress Eyes:+ blurry vision, no floaters, + recent vision changes, no wears glasses or contacts ENT:+ hearing loss, no loose or painful teeth, + partial upper dentures, last saw dentist June 2015 Hematologic:no easy bruising, no abnormal bleeding, no clotting disorder, no frequent epistaxis Endocrine:no diabetes, does not check CBG's at home   Physical Exam:  BP 128/64 mmHg  Pulse 72  Resp 20  Ht 6' (1.829 m)  Wt 207 lb (93.895 kg)  BMI 28.07 kg/m2  SpO2 96% General:Moderately obese, well-appearing HEENT:Unremarkable  Neck:no JVD, no bruits, no adenopathy  Chest:clear to auscultation, symmetrical breath sounds, no wheezes, no rhonchi  RU:[id number] rate and rhythm, prominent holosystolic murmur  Abdomen:soft, non-tender, no masses  Extremities:warm, well-perfused, pulses diminished, 3+ bilateral LE edema Rectal/GUDeferred  Neuro:Grossly non-focal and symmetrical throughout Skin:Clean and dry, + breakdown on both gluteal surfaces c/w pressure  sores - no cellulitis   Diagnostic Tests:  TRANSTHORACIC ECHOCARDIOGRAM  Report from transthoracic echocardiogram performed 05/02/2014 is reviewed. Images are not currently available for review. By report there was mild left ventricular systolic dysfunction with ejection fraction calculated 51%. There was mild-to-moderate left atrial enlargement. There was mild-to-moderate right atrial enlargement. There was mild right ventricular enlargement with normal right ventricular systolic function. There was flail posterior mitral leaflet with severe mitral regurgitation and mild mitral annulus calcification. There was moderate tricuspid regurgitation. There was moderate pulmonary hypertension.    TRANSESOPHAGEAL ECHOCARDIOGRAM  Severe MR due to flail posterior MV leaflet. Mild aortic sclerosis. Normal LVEF. PFO with color flor e/o left to right shunt. Severe LA enlargement        CARDIAC CATHETERIZATION  Procedures performed: Right and left heart catheterization and occlusion of cardiac output and cardiac index by Fick. Right femoral arterial access and right femoral venous access was utilized for performing the procedure.   Indication: Patient is a 78 year-old Caucasian male with diabetes, hyperlipidemia, hypertension, Frequent PVCs on the EKG (old), recently was found to have new onset of her murmur. Physical examination was consistent with severe mitral regurgitation. He is also symptomatic with class 3-4 shortness of breath. Outpatient echocardiogram had revealed flail posterior leaflet of the mitral valve and TEE confirmed severe mitral regurgitation. Hence is brought to the angiography suite to evaluate for pulmonary hypertension and evaluation of cardiac output and cardiac index along with evaluation of coronary artery disease prior to mitral valve repair/replacement.  A 7 French sheath introduced into right femoral vein access. A 6.5 French Swan-Ganz catheter was advanced with  balloon inflated on the sheath under fluoroscopic guidance into first the right atrium followed by the right ventricle and into the pulmonary artery to pulmonary artery wedge position. Hemodynamics were obtained in a locations.  After hemodynamics were completed, samples were taken for SaO2% measurement to be used in [facility] /Index catheterization.  The catheter was then pulled back the balloon down and then completely out of the body.   Left Heart Catheterization   First a 5 French right femoral arterial access was obtained in the usual fashion and 5 [place] 4 diagnostic catheter was advanced over standard J-wire into the ascending aorta and used to engage the Left coronary Artery. Multiple cineangiographic views of the Left then a 5 French Judkins right catheter was utilized to engage the right coronary artery and the same catheter was advanced into the left ventricle for monitoring the hemodynamics. Hemodynamics were then resampled and the catheter pulled back across the aortic valve for measurement of "pullback" gradient. The catheter was then removed the body over wire. All exchanges were made over standard J wire.   Due to marked elevation in LVEDP, stage II chronic kidney disease, left ventricular gram was not performed as the mitral regurgitation has already been confirmed. Minimal contrast was utilized, total of 40 cc for the entire procedure. Patient tolerated the procedure well. There was no immediate complication.  Procedural data:  RA pressure 17/7 Mean 14 mm mercury.  RV pressure 71/7 and Right ventricular EDP 16 mm Hg. PA pressure 81/34 with a mean of 48 mm mercury. PA saturation 69%. PVR was 213 dynes Pulmonary capillary wedge 10/32 with a mean of 30 mm Hg. Giant V wave was evident on pulmonary capillary wedge tracing. Aortic saturation 91%.  Cardiac output was 6.75 with cardiac  index of 3.15 by Fick.   Angiographic data  Left ventricle: not performed due to markedly  elevated LVEDP and to conserve contrast due to stage II chronic kidney disease from diabetes mellitus.  Right coronary artery: Smooth,very large, dominant and mild luminal irregularity present.  Left main coronary artery: Normal. No stenosis. Very mild luminal irregularity evident.  LAD: Large, has a high-grade eccentric mildly calcified proximal long segment 80-85% stenosis. It givesorigin to a moderate sized diagonal 1 which is smooth and normal.   Circumflex coronary artery: Moderate caliber vessel, mild disease.  Impression: severe pulmonary hypertension due to mitral regurgitation with preserved cardiac output and cardiac index. Severe single-vessel coronary artery disease involving the proximal LAD.  Outpatient TEE and TTE both are confirmed. Flail mitral leaflet, involving the posterior mitral valve. Patient also has a PFO.  Recommendation: Patient has already been referred to Dr. Tressie Stalker for evaluation of mitral valve disease. A total of 40 cc of contrast has been utilized for diagnostic angiography.           Impression:  The patient has stage D severe symptomatic primary mitral regurgitation with mild left ventricular systolic dysfunction, moderate to severe pulmonary hypertension, moderate tricuspid regurgitation, and two-vessel coronary artery disease. He presents with acute exacerbation of chronic diastolic congestive heart failure, which over the past 2 weeks has been at least New York Heart Association functional class III. I have personally reviewed the patient's recent transesophageal echocardiogram and diagnostic cardiac catheterization. The patient has degenerative disease of the mitral valve with severe prolapse involving a flail segment of the posterior leaflet with severe mitral regurgitation. There is significant annular calcification. Left ventricular systolic function is mildly reduced. There is moderate tricuspid regurgitation. Catheterization is  notable for long segment tubular 80-85% stenosis of the proximal left anterior descending coronary artery. There is also significant disease in the right coronary artery, particularly beyond the bifurcation takeoff of the posterior descending coronary artery. I agree the patient should be treated with elective mitral valve repair and coronary artery bypass grafting.   Plan:  The patient and his wife were counseled at length regarding the indications, risks and potential benefits of mitral valve repair and coronary artery bypass grafting. The rationale for elective surgery has been explained, including a comparison between surgery and continued medical therapy with close follow-up. The likelihood of successful and durable valve repair has been discussed with particular reference to the findings of their recent echocardiogram. Based upon these findings and previous experience, I have quoted them a greater than 90 percent likelihood of successful valve repair. In the unlikely event that their valve cannot be successfully repaired, we discussed the possibility of replacing the mitral valve using a mechanical prosthesis with the attendant need for long-term anticoagulation versus the alternative of replacing it using a bioprosthetic tissue valve with its potential for late structural valve deterioration and failure, depending upon the patient's longevity. The patient specifically requests that if the mitral valve must be replaced that it be done using a bioprosthetic tissue valve. The patient understands and accepts all potential risks of surgery including but not limited to risk of death, stroke or other neurologic complication, myocardial infarction, congestive heart failure, respiratory failure, renal failure, bleeding requiring transfusion and/or reexploration, arrhythmia, infection or other wound complications, pneumonia, pleural and/or pericardial effusion, pulmonary embolus, aortic dissection or other  major vascular complication, late recurrence of symptomatic ischemic heart disease, or delayed complications related to valve repair or replacement including but not limited to structural  valve deterioration and failure, thrombosis, embolization, endocarditis, or paravalvular leak. All of their questions have been answered. We plan to proceed with surgery on Thursday, 05/25/2014. The patient has been instructed to stop taking fish oil capsules. He will otherwise remain on all other medications without change between now and the time of surgery.   I spent in excess of 90 minutes during the conduct of this office consultation and >50% of this time involved direct face-to-face encounter with the patient for counseling and/or coordination of their care.   Salvatore Decentlarence H. Cornelius Moraswen, MD 05/18/2014 5:20 PM

## 2014-05-25 NOTE — Progress Notes (Signed)
  Echocardiogram Echocardiogram Transesophageal has been performed.  Leta JunglingCooper, Xzayvion Vaeth M 05/25/2014, 9:32 AM

## 2014-05-25 NOTE — Procedures (Signed)
Extubation Procedure Note  Patient Details:   Name: Trevor Aguilar DOB: 02/01/1936 MRN: [id number]   Airway Documentation:  Airway 8.5 mm (Active)  Secured at (cm) 23 cm 05/25/2014  7:48 PM  Measured From Lips 05/25/2014  7:48 PM  Secured Location Right 05/25/2014  7:48 PM  Secured By Pink Tape 05/25/2014  7:48 PM  Site Condition Cool;Dry 05/25/2014  7:48 PM    Evaluation  O2 sats: stable throughout Complications: No apparent complications Patient did tolerate procedure well. Bilateral Breath Sounds: Rhonchi, Diminished   Yes  Audible cuff leak noted prior to extubation. Extubated pt to 4lpm [place]. Pt able to state name, and VS are stable. IS 750mL. No stridor noted. No complications noted.   Shonna ChockBurleson, Ashok Sawaya C 05/25/2014, 10:03 PM

## 2014-05-25 NOTE — Op Note (Addendum)
CARDIOTHORACIC SURGERY OPERATIVE NOTE  Date of Procedure:  05/25/2014  Preoperative Diagnosis:   Severe Mitral Regurgitation  Severe Coronary Artery Disease  Patent Foramen Ovale  Postoperative Diagnosis: Same  Procedure:    Mitral Valve Repair  Complex valvuloplasty including triangular resection of posterior leaflet  Artificial Goretex neochord placement x 2  Sorin Memo 3D ring annuloplasty (size 28mm, catalog [id number], serial [id number])   Coronary Artery Bypass Grafting x 2   Left Internal Mammary Artery to Distal Left Anterior Descending Coronary Artery  Saphenous Vein Graft to Distal Right Coronary Artery  Endoscopic Vein Harvest from Bilateral Thighs   Closure of Patent Foramen Ovale  Surgeon: Salvatore Decent. 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 Jacinto Halim surgical treatment of severe symptomatic mitral regurgitation. The patient has history of hypertension, hyperlipidemia, peripheral arterial disease and multi-focal PVCs without any previous history of coronary artery disease, heart murmur or congestive heart failure. Approximately 2 years ago he saw Dr. Jacinto Halim and underwent a fairly complete cardiac workup to evaluate irregular heart rhythm. He was noted to have frequent PVCs. Transthoracic echocardiogram performed  at that time was reportedly normal. The patient additionally had a nuclear stress test at that time that was felt to be low risk. Approximately a year and a half ago the patient began to experience slow functional decline that initially began with severe problems with his eyesight because of cataracts and glaucoma. His physical activity decreased. He began to experience some progressive exertional fatigue and shortness of breath. He was recently seen by a physician assistant at his primary care physician's office and noted to have a loud murmur on physical exam. He was referred back to Dr. Jacinto Halim who performed a transthoracic echocardiogram in the office on 05/02/2014. By report this echocardiogram demonstrated the presence of mildly depressed left ventricular systolic function with ejection fraction estimated 51%. There was mitral valve prolapse with a flail segment of the posterior leaflet and severe mitral regurgitation. There was moderate tricuspid regurgitation and moderate pulmonary hypertension. At that time the patient was started on metoprolol and losartan. Metoprolol was later discontinued because of acute exacerbation of shortness of breath with leg swelling and leg pain. Lasix has been added recently for treatment of worsening shortness of breath and lower extremity swelling. He was recently started on amiodarone. The patient underwent transesophageal echocardiogram on 05/11/2014. This confirmed the presence of flail segment of the posterior leaflet with severe mitral regurgitation. Left and right heart catheterization was performed 05/12/2014. This was reported to demonstrate severe single-vessel coronary artery disease with long segment 80-85% stenosis of the proximal left anterior descending coronary artery. There was severe pulmonary hypertension. The patient was referred for surgical consultation.  The patient has been seen in consultation and counseled at length regarding the indications,  risks and potential benefits of surgery.  All questions have been answered, and  the patient provides full informed consent for the operation as described.    DETAILS OF THE OPERATIVE PROCEDURE  Preparation:  The patient is brought to the operating room on the above mentioned date and central monitoring was established by the anesthesia team including placement of Swan-Ganz catheter and radial arterial line. The patient is placed in the supine position on the operating table.  Intravenous antibiotics are administered. General endotracheal anesthesia is induced uneventfully. A Foley catheter is placed.  Baseline transesophageal echocardiogram was performed.  Findings were notable for fibroelastic deficiency type degenerative disease of the mitral valve with an obvious flail segment involving the middle scallop of the posterior leaflet (P2).  There were several ruptured chordae tendineae. There was severe mitral regurgitation. Left ventricular systolic function was normal. There was mild left ventricular hypertrophy. There was mild tricuspid regurgitation. The tricuspid annulus was not dilated. There was a small patent foramen ovale.  The patient's chest, abdomen, both groins, and both lower extremities are prepared and draped in a sterile manner. A time out procedure is performed.   Surgical Approach:  A median sternotomy incision was performed and the left internal mammary artery is dissected from the chest wall and prepared for bypass grafting. There were dense adhesions surrounding the left lung.  The left internal mammary artery is notably good quality conduit. Simultaneously, the greater saphenous vein is obtained from the patient's right thigh using endoscopic vein harvest technique. The saphenous vein is notably poor quality conduit.  Subsequently, an additional segment of greater saphenous vein is obtained from the patient's left thigh using endoscopic vein harvest technique. The saphenous  vein from the left thigh is notably good quality conduit.  After removal of the saphenous vein, the small surgical incisions in the lower extremities are closed with absorbable suture. Following systemic heparinization, the left internal mammary artery was transected distally noted to have excellent flow.   Extracorporeal Cardiopulmonary Bypass and Myocardial Protection:  The right common femoral vein is cannulated using the Seldinger technique and a guidewire advanced into the right atrium using TEE guidance.  The patient is heparinized systemically and the right common femoral vein cannulated using a 22 Fr long femoral venous cannula.  The pericardium was opened. The ascending aorta is normal in appearance.  The ascending aorta is cannulated for cardiopulmonary bypass.  Adequate heparinization is verified.   A retrograde cardioplegia cannula is placed through the right atrium into the coronary sinus.  The entire pre-bypass portion of the operation was notable for stable hemodynamics.  Cardiopulmonary bypass was begun and the surface of the heart is inspected.  A second venous cannula is placed directly into the superior vena cava.   A cardioplegia cannula is placed in the ascending aorta.  A temperature probe was placed in the interventricular septum.  The operative field is continuously flooded with carbon dioxide.  The patient is cooled to 28C systemic temperature.  The aortic cross clamp is applied and cold blood cardioplegia is delivered initially in an antegrade fashion through the aortic root.   Supplemental cardioplegia is given retrograde through the coronary sinus catheter.  Iced saline slush is applied for topical hypothermia.  The initial cardioplegic arrest is rapid with early diastolic arrest.  Repeat doses of cardioplegia are administered intermittently throughout the entire cross clamp portion of the operation through the aortic root, down the subsequently placed vein graft, and through  the coronary sinus catheter in order to maintain completely flat electrocardiogram and septal myocardial temperature below 15C.  Myocardial  protection was felt to be excellent.   Coronary Artery Bypass Grafting:   The distal right coronary artery was grafted beyond the takeoff of the posterior descending coronary artery using a reversed saphenous vein graft in an end-to-side fashion.  At the site of distal anastomosis the target vessel was good quality and measured approximately 2.0 mm in diameter.  The distal left anterior coronary artery was grafted with the left internal mammary artery in an end-to-side fashion.  At the site of distal anastomosis the target vessel was good quality and measured approximately 1.8 mm in diameter.   Closure of Patent Foramen Ovale:  A left atriotomy incision was performed through the interatrial groove and extended partially across the back wall of the left atrium after opening the oblique sinus inferiorly.  The intra-atrial septum is inspected and an obvious patent foramen ovale is identified. The patent foramen ovale is closed using 4-0 Prolene suture.   Mitral Valve Repair:  The mitral valve is exposed using a self-retaining retractor.  The mitral valve was inspected and notable for fibroelastic deficiency type degenerative disease. There is an obvious flail segment involving the middle scallop (P2) of the posterior leaflet.  There are multiple ruptured primary chordae tendineae.  No other segments of the valve are affected. There is mild posterior calcification.  Interrupted 2-0 Ethibond horizontal mattress sutures were placed circumferentially around the entire mitral annulus.  These sutures will ultimately be utilized for ring annuloplasty, and at this juncture they are utilized to suspend the valve symmetrically.   A single pledgeted CD4 Gore-Tex suture is placed through the head of the posterior papillary muscle and tied in a horizontal mattress  fashion. The 2 strands from this Gore-Tex suture will be utilized for artificial Gore-Tex neochords.    The flail segment of P2 is repaired using a simple triangular resection. Approximately 25% of the surface area of P2 is resected. The intervening vertical defect in the posterior leaflet is closed using interrupted everting simple CV 5 Gore-Tex suture.  The valve was tested with saline and appeared competent even without ring annuloplasty complete. The valve was sized to a 28 mm annuloplasty ring, based upon the transverse distance between the left and right commissures and the height of the anterior leaflet, corresponding to a size just slightly larger than the overall surface area of the anterior leaflet.  A Sorin Memo 3D Rechord annuloplasty ring (size 28mm, catalog [id number], serial [id number]) was lowered into place uneventfully.   The individual limbs of the Goretex neocords were retrieved from the LV chamber, woven into the posterior leaflet beginning at the free margin where they were placed from the ventricular surface to the atrial surface, and then woven in a diamond shaped fashion towards the posterior mitral annulus.  The Goretex sutures were then tied using the yellow string from the Rechord annuloplasty ring to adjust the height. The blue and yellow strings from the annuloplasty ring are subsequently removed.  The valve was tested with saline and appeared competent. All ring sutures are subsequently secured using Cor-knot clips.  At this juncture the valve was again tested with saline. There is slight overriding of the anterior leaflet on the posterior leaflet at the posterior commissure. This is corrected using a pair of everting simple 4-0 Prolene sutures placed in the commissure.  The valve is again tested with saline and appears to be perfectly competent with a broad symmetrical line of coaptation of the anterior and posterior leaflet. There is no residual leak. There was  a broad, symmetrical  line of coaptation of the anterior and posterior leaflet which was confirmed using the blue ink test.  Rewarming is begun.   Procedure Completion:  The atriotomy was closed using a 2-layer closure of running 3-0 Prolene suture after placing a sump drain across the mitral valve to serve as a left ventricular vent. The single proximal vein graft anastomosis was placed directly to the ascending aorta prior to removal of the aortic cross clamp.  The left ventricular septal myocardial temperature rises rapidly with reperfusion of the left internal mammary artery graft.  One final dose of warm retrograde "hot shot" cardioplegia was administered retrograde through the coronary sinus catheter while all air was evacuated through the aortic root.  The aortic cross clamp was removed after a total cross clamp time of 157 minutes.  All proximal and distal coronary anastomoses were inspected for hemostasis and appropriate graft orientation.  Epicardial pacing wires are fixed to the right ventricular outflow tract and to the right atrial appendage. The patient is rewarmed to 37C temperature. The left ventricular vent is removed.  The patient is weaned and disconnected from cardiopulmonary bypass.  The patient's rhythm at separation from bypass was AV paced.  The patient was weaned from cardioplegic bypass on low dose milrinone infusion.  Total cardiopulmonary bypass time for the operation was 181 minutes.  Followup transesophageal echocardiogram performed after separation from bypass revealed a well-seated mitral annuloplasty ring and a mitral valve that was functioning normally and without any residual mitral regurgitation.  Left ventricular function was unchanged from preoperatively.  Mean transvalvular gradient measured across the mitral valve range between 4 and 6 mmHg.  The aortic and superior vena cava cannula were removed uneventfully. Protamine was administered to reverse the anticoagulation. The femoral venous  cannula was removed and manual pressure held on the groin for 30 minutes.  The mediastinum and pleural space were inspected for hemostasis and irrigated with saline solution.  There was moderate coagulopathy appreciated.  The mediastinum and both pleural spaces were drained using 4 chest tubes placed through separate stab incisions inferiorly.  The soft tissues anterior to the aorta were reapproximated loosely. The sternum is closed with double strength sternal wire. The soft tissues anterior to the sternum were closed in multiple layers and the skin is closed with a running subcuticular skin closure.  The post-bypass portion of the operation was notable for stable rhythm and hemodynamics.  The patient received 2 units packed red blood cells during the procedure due to anemia which was present preoperatively and exacerbated by acute blood loss and hemodilution during cardiopulmonary bypass.   Disposition:  The patient tolerated the procedure well.  The patient was transported to the surgical intensive care unit in stable condition. There were no intraoperative complications. All sponge instrument and needle counts are verified correct at completion of the operation.    Salvatore Decentlarence H. Cornelius Moraswen MD 05/25/2014 2:58 PM

## 2014-05-25 NOTE — Interval H&P Note (Signed)
History and Physical Interval Note:  05/25/2014 7:39 AM  Trevor Aguilar  has presented today for surgery, with the diagnosis of MR CAD  The various methods of treatment have been discussed with the patient and family. After consideration of risks, benefits and other options for treatment, the patient has consented to  Procedure(s): MITRAL VALVE REPAIR (MVR) (N/A) CORONARY ARTERY BYPASS GRAFTING (CABG) (N/A) TRANSESOPHAGEAL ECHOCARDIOGRAM (TEE) (N/A) as a surgical intervention .  The patient's history has been reviewed, patient examined, no change in status, stable for surgery.  I have reviewed the patient's chart and labs.  Questions were answered to the patient's satisfaction.     OWEN,CLARENCE H

## 2014-05-26 ENCOUNTER — Inpatient Hospital Stay (HOSPITAL_COMMUNITY): Payer: Medicare Other

## 2014-05-26 ENCOUNTER — Encounter (HOSPITAL_COMMUNITY): Payer: Self-pay | Admitting: Thoracic Surgery (Cardiothoracic Vascular Surgery)

## 2014-05-26 LAB — PREPARE FRESH FROZEN PLASMA
Unit division: 0
Unit division: 0

## 2014-05-26 LAB — GLUCOSE, CAPILLARY
GLUCOSE-CAPILLARY: 103 mg/dL — AB (ref 70–99)
GLUCOSE-CAPILLARY: 104 mg/dL — AB (ref 70–99)
GLUCOSE-CAPILLARY: 113 mg/dL — AB (ref 70–99)
GLUCOSE-CAPILLARY: 116 mg/dL — AB (ref 70–99)
GLUCOSE-CAPILLARY: 117 mg/dL — AB (ref 70–99)
GLUCOSE-CAPILLARY: 133 mg/dL — AB (ref 70–99)
GLUCOSE-CAPILLARY: 140 mg/dL — AB (ref 70–99)
Glucose-Capillary: 125 mg/dL — ABNORMAL HIGH (ref 70–99)
Glucose-Capillary: 132 mg/dL — ABNORMAL HIGH (ref 70–99)
Glucose-Capillary: 109 mg/dL — ABNORMAL HIGH (ref 70–99)
Glucose-Capillary: 103 mg/dL — ABNORMAL HIGH (ref 70–99)
Glucose-Capillary: 113 mg/dL — ABNORMAL HIGH (ref 70–99)
Glucose-Capillary: 126 mg/dL — ABNORMAL HIGH (ref 70–99)
Glucose-Capillary: 134 mg/dL — ABNORMAL HIGH (ref 70–99)
Glucose-Capillary: 99 mg/dL (ref 70–99)
Glucose-Capillary: 111 mg/dL — ABNORMAL HIGH (ref 70–99)
Glucose-Capillary: 115 mg/dL — ABNORMAL HIGH (ref 70–99)
Glucose-Capillary: 139 mg/dL — ABNORMAL HIGH (ref 70–99)
Glucose-Capillary: 126 mg/dL — ABNORMAL HIGH (ref 70–99)
Glucose-Capillary: 118 mg/dL — ABNORMAL HIGH (ref 70–99)
Glucose-Capillary: 170 mg/dL — ABNORMAL HIGH (ref 70–99)
Glucose-Capillary: 159 mg/dL — ABNORMAL HIGH (ref 70–99)

## 2014-05-26 LAB — PREPARE PLATELET PHERESIS
Unit division: 0
Unit division: 0

## 2014-05-26 LAB — BASIC METABOLIC PANEL
ANION GAP: 13 (ref 5–15)
BUN: 20 mg/dL (ref 6–23)
CHLORIDE: 102 meq/L (ref 96–112)
CO2: 26 meq/L (ref 19–32)
Calcium: 8.2 mg/dL — ABNORMAL LOW (ref 8.4–10.5)
Creatinine, Ser: 1.18 mg/dL (ref 0.50–1.35)
GFR calc Af Amer: 67 mL/min — ABNORMAL LOW (ref >90)
GFR calc non Af Amer: 58 mL/min — ABNORMAL LOW (ref >90)
GLUCOSE: 116 mg/dL — AB (ref 70–99)
Potassium: 3.9 mEq/L (ref 3.7–5.3)
Sodium: 141 mEq/L (ref 137–147)

## 2014-05-26 LAB — CREATININE, SERUM
CREATININE: 1.25 mg/dL (ref 0.50–1.35)
GFR calc Af Amer: 63 mL/min — ABNORMAL LOW (ref >90)
GFR calc non Af Amer: 54 mL/min — ABNORMAL LOW (ref >90)

## 2014-05-26 LAB — CBC
HCT: 23.5 % — ABNORMAL LOW (ref 39.0–52.0)
HEMATOCRIT: 26.4 % — AB (ref 39.0–52.0)
HEMOGLOBIN: 8.3 g/dL — AB (ref 13.0–17.0)
Hemoglobin: 9.2 g/dL — ABNORMAL LOW (ref 13.0–17.0)
MCH: 30.5 pg (ref 26.0–34.0)
MCH: 29.7 pg (ref 26.0–34.0)
MCHC: 35.3 g/dL (ref 30.0–36.0)
MCHC: 34.8 g/dL (ref 30.0–36.0)
MCV: 86.4 fL (ref 78.0–100.0)
MCV: 85.2 fL (ref 78.0–100.0)
PLATELETS: 137 10*3/uL — AB (ref 150–400)
Platelets: 105 10*3/uL — ABNORMAL LOW (ref 150–400)
RBC: 2.72 MIL/uL — AB (ref 4.22–5.81)
RBC: 3.1 MIL/uL — AB (ref 4.22–5.81)
RDW: 15.3 % (ref 11.5–15.5)
RDW: 15.6 % — ABNORMAL HIGH (ref 11.5–15.5)
WBC: 16.6 10*3/uL — AB (ref 4.0–10.5)
WBC: 16.8 10*3/uL — ABNORMAL HIGH (ref 4.0–10.5)

## 2014-05-26 LAB — POCT I-STAT, CHEM 8
BUN: 24 mg/dL — ABNORMAL HIGH (ref 6–23)
CALCIUM ION: 1.24 mmol/L (ref 1.13–1.30)
Chloride: 99 mEq/L (ref 96–112)
Creatinine, Ser: 1.3 mg/dL (ref 0.50–1.35)
GLUCOSE: 181 mg/dL — AB (ref 70–99)
HEMATOCRIT: 33 % — AB (ref 39.0–52.0)
HEMOGLOBIN: 11.2 g/dL — AB (ref 13.0–17.0)
POTASSIUM: 3.5 meq/L — AB (ref 3.7–5.3)
Sodium: 134 mEq/L — ABNORMAL LOW (ref 137–147)
TCO2: 22 mmol/L (ref 0–100)

## 2014-05-26 LAB — POCT I-STAT 3, ART BLOOD GAS (G3+)
Acid-base deficit: 1 mmol/L (ref 0.0–2.0)
BICARBONATE: 24.4 meq/L — AB (ref 20.0–24.0)
O2 Saturation: 93 %
PCO2 ART: 41 mmHg (ref 35.0–45.0)
PO2 ART: 68 mmHg — AB (ref 80.0–100.0)
Patient temperature: 36.6
TCO2: 26 mmol/L (ref 0–100)
pH, Arterial: 7.38 (ref 7.350–7.450)

## 2014-05-26 LAB — PREPARE RBC (CROSSMATCH)

## 2014-05-26 LAB — MAGNESIUM
MAGNESIUM: 2.3 mg/dL (ref 1.5–2.5)
Magnesium: 2.2 mg/dL (ref 1.5–2.5)

## 2014-05-26 MED ORDER — POTASSIUM CHLORIDE 10 MEQ/50ML IV SOLN
10.0000 meq | INTRAVENOUS | Status: AC | PRN
  Administered 2014-05-26 (×3): 10 meq via INTRAVENOUS

## 2014-05-26 MED ORDER — MORPHINE SULFATE 2 MG/ML IJ SOLN: 2.0000 mg | INTRAMUSCULAR | Status: DC | PRN

## 2014-05-26 MED ORDER — CETYLPYRIDINIUM CHLORIDE 0.05 % MT LIQD
7.0000 mL | Freq: Two times a day (BID) | OROMUCOSAL | Status: DC
  Administered 2014-05-26 – 2014-05-30 (×9): 7 mL via OROMUCOSAL

## 2014-05-26 MED ORDER — SODIUM CHLORIDE 0.9 % IV SOLN: Freq: Once | INTRAVENOUS | Status: AC

## 2014-05-26 MED ORDER — FUROSEMIDE 10 MG/ML IJ SOLN
20.0000 mg | Freq: Four times a day (QID) | INTRAMUSCULAR | Status: AC
  Administered 2014-05-26 (×3): 20 mg via INTRAVENOUS
  Filled 2014-05-26 (×3): qty 2

## 2014-05-26 MED ORDER — POTASSIUM CHLORIDE 10 MEQ/50ML IV SOLN
INTRAVENOUS | Status: AC
  Administered 2014-05-26: 10 meq
  Filled 2014-05-26: qty 50

## 2014-05-26 MED ORDER — INSULIN ASPART 100 UNIT/ML ~~LOC~~ SOLN
0.0000 [IU] | SUBCUTANEOUS | Status: DC
  Administered 2014-05-26: 2 [IU] via SUBCUTANEOUS
  Administered 2014-05-26: 4 [IU] via SUBCUTANEOUS
  Administered 2014-05-27 (×2): 2 [IU] via SUBCUTANEOUS

## 2014-05-26 MED ORDER — WARFARIN SODIUM 2.5 MG PO TABS
2.5000 mg | ORAL_TABLET | Freq: Every day | ORAL | Status: DC
  Administered 2014-05-26 – 2014-05-28 (×3): 2.5 mg via ORAL
  Filled 2014-05-26 (×3): qty 1

## 2014-05-26 MED ORDER — COUMADIN BOOK
Freq: Once | Status: AC
  Administered 2014-05-26: 10:00:00
  Filled 2014-05-26: qty 1

## 2014-05-26 MED ORDER — WARFARIN VIDEO
Freq: Once | Status: AC
  Administered 2014-05-27: 12:00:00

## 2014-05-26 MED ORDER — INSULIN DETEMIR 100 UNIT/ML ~~LOC~~ SOLN
20.0000 [IU] | Freq: Every day | SUBCUTANEOUS | Status: DC
  Administered 2014-05-26 – 2014-05-29 (×4): 20 [IU] via SUBCUTANEOUS
  Filled 2014-05-26 (×5): qty 0.2

## 2014-05-26 MED ORDER — WARFARIN - PHYSICIAN DOSING INPATIENT
Freq: Every day | Status: DC
  Administered 2014-05-26 – 2014-05-31 (×3)

## 2014-05-26 MED FILL — Sodium Chloride IV Soln 0.9%: INTRAVENOUS | Qty: 2000 | Status: AC

## 2014-05-26 MED FILL — Mannitol IV Soln 20%: INTRAVENOUS | Qty: 500 | Status: AC

## 2014-05-26 MED FILL — Lidocaine HCl IV Inj 20 MG/ML: INTRAVENOUS | Qty: 5 | Status: AC

## 2014-05-26 MED FILL — Heparin Sodium (Porcine) Inj 1000 Unit/ML: INTRAMUSCULAR | Qty: 10 | Status: AC

## 2014-05-26 MED FILL — Sodium Bicarbonate IV Soln 8.4%: INTRAVENOUS | Qty: 50 | Status: AC

## 2014-05-26 MED FILL — Electrolyte-R (PH 7.4) Solution: INTRAVENOUS | Qty: 6000 | Status: AC

## 2014-05-26 NOTE — Progress Notes (Signed)
POD # 1 CABG , mitral repair  BP 110/40 mmHg  Pulse 35  Temp(Src) 97.8 F (36.6 C) (Oral)  Resp 21  Ht 6' (1.829 m)  Wt 215 lb 6.2 oz (97.7 kg)  BMI 29.21 kg/m2  SpO2 94%   Intake/Output Summary (Last 24 hours) at 05/26/14 1923 Last data filed at 05/26/14 1837  Gross per 24 hour  Intake 3090.43 ml  Output   4275 ml  Net -1184.57 ml    K= 3.5- supplement per protocol

## 2014-05-26 NOTE — Progress Notes (Addendum)
[address]       [place] [id number]             [phone]        CARDIOTHORACIC SURGERY PROGRESS NOTE   R1 Day Post-Op Procedure(s) (LRB): MITRAL VALVE REPAIR (MVR) WITH CLOSURE OF PATENT FORAMEN OVALE (N/A) CORONARY ARTERY BYPASS GRAFTING (CABG)TIMES 2 USING LEFT INTERNAL MAMMARY AND LEFT SAPHENOUS VEIN HARVESTED ENDOSCOPICALLY (N/A) TRANSESOPHAGEAL ECHOCARDIOGRAM (TEE) (N/A)  Subjective: Looks good.  Mild soreness in chest.  Denies SOB.  No nausea.  Objective: Vital signs: BP Readings from Last 1 Encounters:  05/26/14 122/45   Pulse Readings from Last 1 Encounters:  05/26/14 40   Resp Readings from Last 1 Encounters:  05/26/14 20   Temp Readings from Last 1 Encounters:  05/26/14 98.1 F (36.7 C)     Hemodynamics: PAP: (25-51)/(10-25) 32/13 mmHg CO:  [4 L/min-6.4 L/min] 4.7 L/min CI:  [1.9 L/min/m2-3 L/min/m2] 2.2 L/min/m2  Physical Exam:  Rhythm:   Sinus w/ bigeminy  Breath sounds: clear  Heart sounds:  Irregular, no murmur  Incisions:  Dressing dry, intact  Abdomen:  Soft, non-distended, non-tender  Extremities:  Warm, well-perfused  Chest tubes:  Thin serosanguinous drainage   Intake/Output from previous day: 12/17 0701 - 12/18 0700 In: 9060 [I.V.:4491; Blood:2319; IV Piggyback:2250] Out: 6820 [Urine:4010; Blood:1250; Chest Tube:1560] Intake/Output this shift:    Lab Results:  CBC: Recent Labs  05/25/14 2115 05/26/14 0400  WBC 18.4* 16.6*  HGB 6.7* 8.3*  HCT 19.3* 23.5*  PLT 166 137*    BMET:  Recent Labs  05/23/14 1436  05/25/14 1333 05/25/14 1526 05/25/14 2115 05/26/14 0400  NA 137  < > 137 140  --  141  K 4.0  < > 3.1* 2.9*  --  3.9  CL 94*  < > 96  --   --  102  CO2 25  --   --   --   --  26  GLUCOSE 122*  < > 250* 162*  --  116*  BUN 29*  < > 18  --   --  20  CREATININE 1.43*  < > 1.10  --  1.19 1.18  CALCIUM 10.2  --   --   --   --  8.2*  < > = values in this interval not displayed.   CBG (last  3)   Recent Labs  05/26/14 0357 05/26/14 0607 05/26/14 0655  GLUCAP 99 104* 113*    ABG    Component Value Date/Time   PHART 7.380 05/26/2014 0358   PCO2ART 41.0 05/26/2014 0358   PO2ART 68.0* 05/26/2014 0358   HCO3 24.4* 05/26/2014 0358   TCO2 26 05/26/2014 0358   ACIDBASEDEF 1.0 05/26/2014 0358   O2SAT 93.0 05/26/2014 0358    CXR: Looks good.  Mild bibasilar atelectasis L>R, possible small L effusion  Assessment/Plan: S/P Procedure(s) (LRB): MITRAL VALVE REPAIR (MVR) WITH CLOSURE OF PATENT FORAMEN OVALE (N/A) CORONARY ARTERY BYPASS GRAFTING (CABG)TIMES 2 USING LEFT INTERNAL MAMMARY AND LEFT SAPHENOUS VEIN HARVESTED ENDOSCOPICALLY (N/A) TRANSESOPHAGEAL ECHOCARDIOGRAM (TEE) (N/A)  Doing well POD1 Maintaining stable rhythm, similar to pre-op Stable hemodynamics on low dose dopamine and milrinone Expected post op acute blood loss anemia Expected post op volume excess Expected post op atelectasis, L>R Type II diabetes mellitus, newly diagnosed, excellent glycemic control on insulin drip   Mobilize  Wean drips  D/C lines  Leave chest tubes in place for now  Transfuse 1 unit PRBC's  Diuresis  Start coumadin slowly  Resume prophylactic low dose amiodarone tomorrow  Add levemir and wean insulin drip off  OWEN,CLARENCE H 05/26/2014 7:54 AM

## 2014-05-26 NOTE — Progress Notes (Signed)
Utilization Review Completed.  

## 2014-05-27 ENCOUNTER — Inpatient Hospital Stay (HOSPITAL_COMMUNITY): Payer: Medicare Other

## 2014-05-27 LAB — CBC
HEMATOCRIT: 25.8 % — AB (ref 39.0–52.0)
Hemoglobin: 9.2 g/dL — ABNORMAL LOW (ref 13.0–17.0)
MCH: 31.2 pg (ref 26.0–34.0)
MCHC: 35.7 g/dL (ref 30.0–36.0)
MCV: 87.5 fL (ref 78.0–100.0)
PLATELETS: 104 10*3/uL — AB (ref 150–400)
RBC: 2.95 MIL/uL — ABNORMAL LOW (ref 4.22–5.81)
RDW: 16.3 % — ABNORMAL HIGH (ref 11.5–15.5)
WBC: 14.8 10*3/uL — ABNORMAL HIGH (ref 4.0–10.5)

## 2014-05-27 LAB — TYPE AND SCREEN
ABO/RH(D): O NEG
Antibody Screen: NEGATIVE
UNIT DIVISION: 0
UNIT DIVISION: 0
UNIT DIVISION: 0
UNIT DIVISION: 0
Unit division: 0
Unit division: 0
Unit division: 0

## 2014-05-27 LAB — GLUCOSE, CAPILLARY
Glucose-Capillary: 127 mg/dL — ABNORMAL HIGH (ref 70–99)
Glucose-Capillary: 128 mg/dL — ABNORMAL HIGH (ref 70–99)
Glucose-Capillary: 133 mg/dL — ABNORMAL HIGH (ref 70–99)
Glucose-Capillary: 144 mg/dL — ABNORMAL HIGH (ref 70–99)
Glucose-Capillary: 93 mg/dL (ref 70–99)

## 2014-05-27 LAB — BASIC METABOLIC PANEL
Anion gap: 14 (ref 5–15)
BUN: 28 mg/dL — AB (ref 6–23)
CO2: 24 meq/L (ref 19–32)
CREATININE: 1.44 mg/dL — AB (ref 0.50–1.35)
Calcium: 8.5 mg/dL (ref 8.4–10.5)
Chloride: 99 mEq/L (ref 96–112)
GFR calc Af Amer: 53 mL/min — ABNORMAL LOW (ref >90)
GFR calc non Af Amer: 46 mL/min — ABNORMAL LOW (ref >90)
GLUCOSE: 141 mg/dL — AB (ref 70–99)
Potassium: 3.9 mEq/L (ref 3.7–5.3)
SODIUM: 137 meq/L (ref 137–147)

## 2014-05-27 LAB — PROTIME-INR
INR: 1.2 (ref 0.00–1.49)
Prothrombin Time: 15.3 seconds — ABNORMAL HIGH (ref 11.6–15.2)

## 2014-05-27 MED ORDER — INSULIN ASPART 100 UNIT/ML ~~LOC~~ SOLN
0.0000 [IU] | Freq: Three times a day (TID) | SUBCUTANEOUS | Status: DC
Start: 2014-05-27 — End: 2014-06-02
  Administered 2014-05-28: 3 [IU] via SUBCUTANEOUS

## 2014-05-27 MED ORDER — SIMVASTATIN 20 MG PO TABS
20.0000 mg | ORAL_TABLET | Freq: Every day | ORAL | Status: DC
  Administered 2014-05-27 – 2014-06-01 (×6): 20 mg via ORAL
  Filled 2014-05-27 (×7): qty 1

## 2014-05-27 MED ORDER — AMIODARONE HCL 200 MG PO TABS
400.0000 mg | ORAL_TABLET | Freq: Two times a day (BID) | ORAL | Status: DC
  Administered 2014-05-27 – 2014-05-29 (×5): 400 mg via ORAL
  Filled 2014-05-27 (×7): qty 2

## 2014-05-27 MED ORDER — TIZANIDINE HCL 4 MG PO TABS
4.0000 mg | ORAL_TABLET | Freq: Two times a day (BID) | ORAL | Status: DC | PRN
  Filled 2014-05-27: qty 1

## 2014-05-27 MED ORDER — TIZANIDINE HCL 4 MG PO TABS: 4.0000 mg | ORAL_TABLET | Freq: Every day | ORAL | Status: DC

## 2014-05-27 MED ORDER — TIZANIDINE HCL 4 MG PO TABS
4.0000 mg | ORAL_TABLET | Freq: Every evening | ORAL | Status: DC | PRN
  Filled 2014-05-27: qty 1

## 2014-05-27 NOTE — Progress Notes (Signed)
2 Days Post-Op Procedure(s) (LRB): MITRAL VALVE REPAIR (MVR) WITH CLOSURE OF PATENT FORAMEN OVALE (N/A) CORONARY ARTERY BYPASS GRAFTING (CABG)TIMES 2 USING LEFT INTERNAL MAMMARY AND LEFT SAPHENOUS VEIN HARVESTED ENDOSCOPICALLY (N/A) TRANSESOPHAGEAL ECHOCARDIOGRAM (TEE) (N/A) Subjective: Wants to rest Some incisional pain  Objective: Vital signs in last 24 hours: Temp:  [97.7 F (36.5 C)-98.7 F (37.1 C)] 98 F (36.7 C) (12/19 0400) Pulse Rate:  [33-69] 37 (12/19 0600) Cardiac Rhythm:  [-] Normal sinus rhythm (12/19 0000) Resp:  [15-27] 19 (12/19 0600) BP: (88-124)/(40-64) 113/46 mmHg (12/19 0600) SpO2:  [89 %-100 %] 95 % (12/19 0600) Arterial Line BP: (116-154)/(36-49) 125/47 mmHg (12/18 1600) Weight:  [215 lb 6.2 oz (97.7 kg)] 215 lb 6.2 oz (97.7 kg) (12/18 1208)  Hemodynamic parameters for last 24 hours: PAP: (24-33)/(10-15) 32/12 mmHg CO:  [4.1 L/min-5 L/min] 5 L/min CI:  [1.9 L/min/m2-2.3 L/min/m2] 2.3 L/min/m2  Intake/Output from previous day: 12/18 0701 - 12/19 0700 In: 1111.5 [P.O.:180; I.V.:354; Blood:327.5; IV Piggyback:250] Out: 1685 [Urine:1205; Chest Tube:480] Intake/Output this shift:    General appearance: alert and no distress Neurologic: intact Heart: regularly irregular rhythm Lungs: diminished breath sounds bibasilar  Lab Results:  Recent Labs  05/26/14 1500 05/26/14 1603 05/27/14 0300  WBC 16.8*  --  14.8*  HGB 9.2* 11.2* 9.2*  HCT 26.4* 33.0* 25.8*  PLT 105*  --  104*   BMET:  Recent Labs  05/26/14 0400  05/26/14 1603 05/27/14 0300  NA 141  --  134* 137  K 3.9  --  3.5* 3.9  CL 102  --  99 99  CO2 26  --   --  24  GLUCOSE 116*  --  181* 141*  BUN 20  --  24* 28*  CREATININE 1.18  < > 1.30 1.44*  CALCIUM 8.2*  --   --  8.5  < > = values in this interval not displayed.  PT/INR:  Recent Labs  05/27/14 0300  LABPROT 15.3*  INR 1.20   ABG    Component Value Date/Time   PHART 7.380 05/26/2014 0358   HCO3 24.4* 05/26/2014 0358    TCO2 22 05/26/2014 1603   ACIDBASEDEF 1.0 05/26/2014 0358   O2SAT 93.0 05/26/2014 0358   CBG (last 3)   Recent Labs  05/26/14 1950 05/26/14 2359 05/27/14 0521  GLUCAP 159* 144* 133*    Assessment/Plan: S/P Procedure(s) (LRB): MITRAL VALVE REPAIR (MVR) WITH CLOSURE OF PATENT FORAMEN OVALE (N/A) CORONARY ARTERY BYPASS GRAFTING (CABG)TIMES 2 USING LEFT INTERNAL MAMMARY AND LEFT SAPHENOUS VEIN HARVESTED ENDOSCOPICALLY (N/A) TRANSESOPHAGEAL ECHOCARDIOGRAM (TEE) (N/A) -  CV- s/p CABG/ MV repair  In bigeminy currently, will hold off on restarting amiodarone as rate is only 70  Coumadin 2.5 daily  Weaning dopamine  RESP- bibasilar atelectasis L>R  C/o pain with deep breathing  Add flutter valve  RENAL- creatinine up to 1.44- monitor  ENDO- newly dx DM- CBG OK, [facility]  Mobilize   LOS: 2 days    Trevor Aguilar C 05/27/2014

## 2014-05-27 NOTE — Progress Notes (Signed)
He was having issues with low BP while in bigeminy earlier.  A pacing improved his BP but has stopped capturing.  Pacer is sensing irregular atrial beats- i suspect he is in a fib underneath but with significant block as he is very bradycardic with pacer off.   Dc lopressor, restart amiodarone  DDD pace for now  Minimal output from CT- will dc

## 2014-05-28 ENCOUNTER — Inpatient Hospital Stay (HOSPITAL_COMMUNITY): Payer: Medicare Other

## 2014-05-28 LAB — BASIC METABOLIC PANEL
Anion gap: 11 (ref 5–15)
BUN: 32 mg/dL — ABNORMAL HIGH (ref 6–23)
CO2: 28 meq/L (ref 19–32)
Calcium: 8 mg/dL — ABNORMAL LOW (ref 8.4–10.5)
Chloride: 98 mEq/L (ref 96–112)
Creatinine, Ser: 1.2 mg/dL (ref 0.50–1.35)
GFR calc Af Amer: 66 mL/min — ABNORMAL LOW (ref >90)
GFR, EST NON AFRICAN AMERICAN: 57 mL/min — AB (ref >90)
Glucose, Bld: 97 mg/dL (ref 70–99)
Potassium: 3.7 mEq/L (ref 3.7–5.3)
SODIUM: 137 meq/L (ref 137–147)

## 2014-05-28 LAB — CBC
HCT: 24.3 % — ABNORMAL LOW (ref 39.0–52.0)
HEMOGLOBIN: 8.2 g/dL — AB (ref 13.0–17.0)
MCH: 30 pg (ref 26.0–34.0)
MCHC: 33.7 g/dL (ref 30.0–36.0)
MCV: 89 fL (ref 78.0–100.0)
Platelets: 86 10*3/uL — ABNORMAL LOW (ref 150–400)
RBC: 2.73 MIL/uL — AB (ref 4.22–5.81)
RDW: 16.3 % — ABNORMAL HIGH (ref 11.5–15.5)
WBC: 13.9 10*3/uL — AB (ref 4.0–10.5)

## 2014-05-28 LAB — GLUCOSE, CAPILLARY
GLUCOSE-CAPILLARY: 93 mg/dL (ref 70–99)
GLUCOSE-CAPILLARY: 128 mg/dL — AB (ref 70–99)
Glucose-Capillary: 115 mg/dL — ABNORMAL HIGH (ref 70–99)
Glucose-Capillary: 101 mg/dL — ABNORMAL HIGH (ref 70–99)
Glucose-Capillary: 181 mg/dL — ABNORMAL HIGH (ref 70–99)

## 2014-05-28 LAB — PROTIME-INR
INR: 1.05 (ref 0.00–1.49)
PROTHROMBIN TIME: 13.8 s (ref 11.6–15.2)

## 2014-05-28 MED ORDER — WARFARIN SODIUM 5 MG PO TABS
5.0000 mg | ORAL_TABLET | Freq: Every day | ORAL | Status: DC
  Administered 2014-05-29 – 2014-06-01 (×4): 5 mg via ORAL
  Filled 2014-05-28 (×7): qty 1

## 2014-05-28 MED ORDER — POTASSIUM CHLORIDE CRYS ER 20 MEQ PO TBCR
40.0000 meq | EXTENDED_RELEASE_TABLET | Freq: Two times a day (BID) | ORAL | Status: AC
  Administered 2014-05-28 (×2): 40 meq via ORAL
  Filled 2014-05-28 (×2): qty 2

## 2014-05-28 MED ORDER — BRINZOLAMIDE-BRIMONIDINE 1-0.2 % OP SUSP
1.0000 [drp] | Freq: Three times a day (TID) | OPHTHALMIC | Status: DC
Start: 2014-05-28 — End: 2014-06-02
  Administered 2014-05-28 – 2014-06-02 (×15): 1 [drp] via OPHTHALMIC

## 2014-05-28 MED ORDER — OFLOXACIN 0.3 % OP SOLN
1.0000 [drp] | Freq: Four times a day (QID) | OPHTHALMIC | Status: DC | PRN
  Filled 2014-05-28: qty 5

## 2014-05-28 MED ORDER — FUROSEMIDE 40 MG PO TABS
40.0000 mg | ORAL_TABLET | Freq: Every day | ORAL | Status: DC
  Administered 2014-05-28 – 2014-05-31 (×4): 40 mg via ORAL
  Filled 2014-05-28 (×6): qty 1

## 2014-05-28 MED ORDER — FE FUMARATE-B12-VIT C-FA-IFC PO CAPS
1.0000 | ORAL_CAPSULE | Freq: Every day | ORAL | Status: DC
  Administered 2014-05-29 – 2014-06-02 (×4): 1 via ORAL
  Filled 2014-05-28 (×6): qty 1

## 2014-05-28 NOTE — Progress Notes (Signed)
TCTS DAILY ICU PROGRESS NOTE                   [address]            [place] [id number]          [phone]   3 Days Post-Op Procedure(s) (LRB): MITRAL VALVE REPAIR (MVR) WITH CLOSURE OF PATENT FORAMEN OVALE (N/A) CORONARY ARTERY BYPASS GRAFTING (CABG)TIMES 2 USING LEFT INTERNAL MAMMARY AND LEFT SAPHENOUS VEIN HARVESTED ENDOSCOPICALLY (N/A) TRANSESOPHAGEAL ECHOCARDIOGRAM (TEE) (N/A)  Total Length of Stay:  LOS: 3 days   Subjective: Patient walked a fair amount today. He is now eating dinner. No specific complaints.  Objective: Vital signs in last 24 hours: Temp:  [97 F (36.1 C)-98.5 F (36.9 C)] 98.5 F (36.9 C) (12/20 1500) Pulse Rate:  [29-83] 67 (12/20 1700) Cardiac Rhythm:  [-] Normal sinus rhythm (12/20 1600) Resp:  [10-23] 18 (12/20 1700) BP: (78-119)/(38-75) 115/70 mmHg (12/20 1600) SpO2:  [90 %-100 %] 100 % (12/20 1700) Weight:  [213 lb 3 oz (96.7 kg)] 213 lb 3 oz (96.7 kg) (12/20 0500)  Filed Weights   05/26/14 0435 05/26/14 1208 05/28/14 0500  Weight: 215 lb 6.2 oz (97.7 kg) 215 lb 6.2 oz (97.7 kg) 213 lb 3 oz (96.7 kg)    Weight change: -2 lb 3.3 oz (-1 kg)      Intake/Output from previous day: 12/19 0701 - 12/20 0700 In: 270 [P.O.:120; I.V.:150] Out: 1985 [Urine:1745; Chest Tube:240]  Intake/Output this shift: Total I/O In: 880 [P.O.:880] Out: 435 [Urine:435]  Current Meds: Scheduled Meds: . acetaminophen  1,000 mg Oral 4 times per day  . amiodarone  400 mg Oral BID  . antiseptic oral rinse  7 mL Mouth Rinse BID  . aspirin EC  325 mg Oral Daily  . bisacodyl  10 mg Oral Daily   Or  . bisacodyl  10 mg Rectal Daily  . Brinzolamide-Brimonidine  1 drop Right Eye TID  . Difluprednate  1 drop Left Eye TID  . docusate sodium  200 mg Oral Daily  . furosemide  40 mg Oral Daily  . insulin aspart  0-15 Units Subcutaneous TID WC  . insulin detemir  20 Units Subcutaneous Daily  . pantoprazole  40 mg Oral Daily  . potassium chloride   40 mEq Oral BID  . simvastatin  20 mg Oral QHS  . sodium chloride  3 mL Intravenous Q12H  . warfarin  2.5 mg Oral q1800  . Warfarin - Physician Dosing Inpatient   Does not apply q1800   Continuous Infusions: . sodium chloride 250 mL (05/27/14 0600)  . DOPamine Stopped (05/27/14 1600)  . milrinone Stopped (05/26/14 1000)   PRN Meds:.metoprolol, morphine injection, ofloxacin, ondansetron (ZOFRAN) IV, oxyCODONE, sodium chloride, tiZANidine, tiZANidine, traMADol  General appearance: alert, cooperative and no distress Neurologic: intact Heart: regular rate and rhythm Lungs: clear to auscultation bilaterally Abdomen: soft, non-tender; bowel sounds normal; no masses,  no organomegaly Extremities: Mild LE edema Wound: Dressing is clean and dry  Lab Results: CBC: Recent Labs  05/27/14 0300 05/28/14 0312  WBC 14.8* 13.9*  HGB 9.2* 8.2*  HCT 25.8* 24.3*  PLT 104* 86*   BMET:  Recent Labs  05/27/14 0300 05/28/14 0312  NA 137 137  K 3.9 3.7  CL 99 98  CO2 24 28  GLUCOSE 141* 97  BUN 28* 32*  CREATININE 1.44* 1.20  CALCIUM 8.5 8.0*    PT/INR:  Recent Labs  05/28/14  [id number]  LABPROT 13.8  INR 1.05   Radiology: Dg Chest Port 1 View  05/28/2014   CLINICAL DATA:  Atelectasis  EXAM: PORTABLE CHEST - 1 VIEW  COMPARISON:  05/27/2014  FINDINGS: Right jugular sheath in satisfactory position. Interval removal of the left-sided chest tube and mediastinal drain. Trace left pleural effusion. Left basilar hazy airspace disease likely reflecting atelectasis with interval improved aeration. Clear right lung. Stable cardiomediastinal silhouette. Prior CABG.  IMPRESSION: Interval removal of the left-sided chest tube and mediastinal drain. No left pneumothorax.  Left basilar atelectasis with improved aeration.   Electronically Signed   By: Elige KoHetal  Patel   On: 05/28/2014 08:29   Dg Chest Port 1 View  05/27/2014   CLINICAL DATA:  Postoperative coronary artery bypass grafting with atelectatic  change  EXAM: PORTABLE CHEST - 1 VIEW  COMPARISON:  May 26, 2014  FINDINGS: Swan-Ganz catheter has been removed. Cordis tip is in the superior vena cava. There are bilateral chest tubes as well as a mediastinal drain. No pneumothorax. There is patchy atelectatic change in the left lower lobe with small left effusion, stable. Right lung is clear. Heart is prominent with pulmonary vascularity within normal limits, stable. No adenopathy.  IMPRESSION: Tube and catheter positions as described without pneumothorax. Persistent left lower lobe atelectatic change with small left effusion. No new opacity. No change in cardiac silhouette.   Electronically Signed   By: Bretta BangWilliam  Woodruff M.D.   On: 05/27/2014 07:18     Assessment/Plan: S/P Procedure(s) (LRB): MITRAL VALVE REPAIR (MVR) WITH CLOSURE OF PATENT FORAMEN OVALE (N/A) CORONARY ARTERY BYPASS GRAFTING (CABG)TIMES 2 USING LEFT INTERNAL MAMMARY AND LEFT SAPHENOUS VEIN HARVESTED ENDOSCOPICALLY (N/A) TRANSESOPHAGEAL ECHOCARDIOGRAM (TEE) (N/A)  1.CV-Previous a fib. Bigeminy.SR in the 60's. On Amiodarone 400 bid and Coumadin. Not on BB secondary to bradycardia. INR decreased from 1.2 to 1.05. Will give Coumadin 5 mg tonight. 2.Pulmonary- On room air. CXR shows no pneumothorax, tiny left pleural effusion and atelectasis, right lung is clear.Encourage incentive spirometer and flutter valve. 3.Volume overload-on Lasix 40 daily 4. ABL anemia-H and H decreased to 8.2 and 24.3 Start Trinsicon 5. CBGs  101/181/93. Pre op HGA1C 6.1. Is likely pre diabetic. Will need follow up with medical doctor after discharge. 6. Thrombocytopenia-platelets down to 86,000. Monitor 7. Creatinine decreased from 1.44 to 1.2  Rekisha Welling M PA-C 05/28/2014 5:38 PM

## 2014-05-29 ENCOUNTER — Inpatient Hospital Stay (HOSPITAL_COMMUNITY): Payer: Medicare Other

## 2014-05-29 LAB — GLUCOSE, CAPILLARY
Glucose-Capillary: 107 mg/dL — ABNORMAL HIGH (ref 70–99)
Glucose-Capillary: 129 mg/dL — ABNORMAL HIGH (ref 70–99)
Glucose-Capillary: 82 mg/dL (ref 70–99)
Glucose-Capillary: 83 mg/dL (ref 70–99)
Glucose-Capillary: 85 mg/dL (ref 70–99)
Glucose-Capillary: 90 mg/dL (ref 70–99)

## 2014-05-29 LAB — BASIC METABOLIC PANEL
Anion gap: 9 (ref 5–15)
BUN: 28 mg/dL — ABNORMAL HIGH (ref 6–23)
CO2: 30 mEq/L (ref 19–32)
Calcium: 8.2 mg/dL — ABNORMAL LOW (ref 8.4–10.5)
Chloride: 98 mEq/L (ref 96–112)
Creatinine, Ser: 1.06 mg/dL (ref 0.50–1.35)
GFR calc non Af Amer: 66 mL/min — ABNORMAL LOW (ref >90)
GFR, EST AFRICAN AMERICAN: 77 mL/min — AB (ref >90)
Glucose, Bld: 94 mg/dL (ref 70–99)
POTASSIUM: 3.5 meq/L — AB (ref 3.7–5.3)
SODIUM: 137 meq/L (ref 137–147)

## 2014-05-29 LAB — CBC
HCT: 24.9 % — ABNORMAL LOW (ref 39.0–52.0)
HEMOGLOBIN: 8.4 g/dL — AB (ref 13.0–17.0)
MCH: 30.5 pg (ref 26.0–34.0)
MCHC: 33.7 g/dL (ref 30.0–36.0)
MCV: 90.5 fL (ref 78.0–100.0)
Platelets: 93 10*3/uL — ABNORMAL LOW (ref 150–400)
RBC: 2.75 MIL/uL — ABNORMAL LOW (ref 4.22–5.81)
RDW: 15.6 % — AB (ref 11.5–15.5)
WBC: 11.7 10*3/uL — ABNORMAL HIGH (ref 4.0–10.5)

## 2014-05-29 LAB — PROTIME-INR
INR: 1.07 (ref 0.00–1.49)
PROTHROMBIN TIME: 14 s (ref 11.6–15.2)

## 2014-05-29 MED ORDER — WARFARIN SODIUM 5 MG PO TABS: 5.0000 mg | ORAL_TABLET | Freq: Once | ORAL | Status: DC

## 2014-05-29 MED ORDER — POTASSIUM CHLORIDE 10 MEQ/50ML IV SOLN: 10.0000 meq | INTRAVENOUS | Status: DC | PRN

## 2014-05-29 MED ORDER — POLYVINYL ALCOHOL 1.4 % OP SOLN
1.0000 [drp] | OPHTHALMIC | Status: DC | PRN
  Filled 2014-05-29: qty 15

## 2014-05-29 MED FILL — Heparin Sodium (Porcine) Inj 1000 Unit/ML: INTRAMUSCULAR | Qty: 30 | Status: AC

## 2014-05-29 MED FILL — Magnesium Sulfate Inj 50%: INTRAMUSCULAR | Qty: 10 | Status: AC

## 2014-05-29 MED FILL — Potassium Chloride Inj 2 mEq/ML: INTRAVENOUS | Qty: 40 | Status: AC

## 2014-05-29 NOTE — Progress Notes (Signed)
Patient ID: Trevor Aguilar, male   DOB: [birth year], 78 y.o.   MRN: [id number] TCTS DAILY ICU PROGRESS NOTE                   [address]            [place], [id number]          [phone]   4 Days Post-Op Procedure(s) (LRB): MITRAL VALVE REPAIR (MVR) WITH CLOSURE OF PATENT FORAMEN OVALE (N/A) CORONARY ARTERY BYPASS GRAFTING (CABG)TIMES 2 USING LEFT INTERNAL MAMMARY AND LEFT SAPHENOUS VEIN HARVESTED ENDOSCOPICALLY (N/A) TRANSESOPHAGEAL ECHOCARDIOGRAM (TEE) (N/A)  Total Length of Stay:  LOS: 4 days   Subjective: Up to chair , alert neuro intact slow moving  Objective: Vital signs in last 24 hours: Temp:  [97 F (36.1 C)-98.5 F (36.9 C)] 97.8 F (36.6 C) (12/21 0727) Pulse Rate:  [29-80] 54 (12/21 0700) Cardiac Rhythm:  [-] Normal sinus rhythm (12/21 0600) Resp:  [12-29] 13 (12/21 0700) BP: (95-134)/(35-70) 123/55 mmHg (12/21 0700) SpO2:  [92 %-100 %] 99 % (12/21 0700) Weight:  [211 lb 13.8 oz (96.1 kg)] 211 lb 13.8 oz (96.1 kg) (12/21 0500)  Filed Weights   05/26/14 1208 05/28/14 0500 05/29/14 0500  Weight: 215 lb 6.2 oz (97.7 kg) 213 lb 3 oz (96.7 kg) 211 lb 13.8 oz (96.1 kg)    Weight change: -1 lb 5.2 oz (-0.6 kg)   Hemodynamic parameters for last 24 hours:    Intake/Output from previous day: 12/20 0701 - 12/21 0700 In: 1600 [P.O.:1600] Out: 1187 [Urine:1185; Stool:2]  Intake/Output this shift:    Current Meds: Scheduled Meds: . acetaminophen  1,000 mg Oral 4 times per day  . amiodarone  400 mg Oral BID  . antiseptic oral rinse  7 mL Mouth Rinse BID  . aspirin EC  325 mg Oral Daily  . bisacodyl  10 mg Oral Daily   Or  . bisacodyl  10 mg Rectal Daily  . Brinzolamide-Brimonidine  1 drop Right Eye TID  . Difluprednate  1 drop Left Eye TID  . docusate sodium  200 mg Oral Daily  . ferrous fumarate-b12-vitamic C-folic acid  1 capsule Oral Q breakfast  . furosemide  40 mg Oral Daily  . insulin aspart  0-15 Units Subcutaneous TID WC  .  insulin detemir  20 Units Subcutaneous Daily  . pantoprazole  40 mg Oral Daily  . simvastatin  20 mg Oral QHS  . sodium chloride  3 mL Intravenous Q12H  . warfarin  5 mg Oral q1800  . Warfarin - Physician Dosing Inpatient   Does not apply q1800   Continuous Infusions: . sodium chloride 250 mL (05/27/14 0600)  . DOPamine Stopped (05/27/14 1600)  . milrinone Stopped (05/26/14 1000)   PRN Meds:.metoprolol, morphine injection, ofloxacin, ondansetron (ZOFRAN) IV, oxyCODONE, sodium chloride, tiZANidine, tiZANidine, traMADol  General appearance: alert, cooperative, appears older than stated age, fatigued and no distress Neurologic: intact Heart: regular rate and rhythm, S1, S2 normal, no murmur, click, rub or gallop, no click, no rub and sinus 55 Lungs: diminished breath sounds bibasilar Abdomen: soft, non-tender; bowel sounds normal; no masses,  no organomegaly Extremities: edema 2 + edwma both legs Wound: sternum stable   Lab Results: CBC: Recent Labs  05/28/14 0312 05/29/14 0330  WBC 13.9* 11.7*  HGB 8.2* 8.4*  HCT 24.3* 24.9*  PLT 86* 93*   BMET:  Recent Labs  05/28/14 0312 05/29/14 0330  NA 137 137  K 3.7  3.5*  CL 98 98  CO2 28 30  GLUCOSE 97 94  BUN 32* 28*  CREATININE 1.20 1.06  CALCIUM 8.0* 8.2*    PT/INR:  Recent Labs  05/29/14 0330  LABPROT 14.0  INR 1.07   Radiology: Dg Chest Port 1 View  05/29/2014   CLINICAL DATA:  CABG .  EXAM: PORTABLE CHEST - 1 VIEW  COMPARISON:  05/28/2014.  FINDINGS: Prior CABG. Cardiomegaly. Normal pulmonary vascularity. Left lower lobe atelectasis and/or infiltrate with left-sided pleural effusion. No pneumothorax. No acute osseous abnormality.  IMPRESSION: 1. Cardiomegaly.  Prior CABG. 2. Left lower lobe atelectasis and/or infiltrate with small left pleural effusion.   Electronically Signed   By: Maisie Fushomas  Register   On: 05/29/2014 07:32   Dg Chest Port 1 View  05/28/2014   CLINICAL DATA:  Atelectasis  EXAM: PORTABLE CHEST - 1  VIEW  COMPARISON:  05/27/2014  FINDINGS: Right jugular sheath in satisfactory position. Interval removal of the left-sided chest tube and mediastinal drain. Trace left pleural effusion. Left basilar hazy airspace disease likely reflecting atelectasis with interval improved aeration. Clear right lung. Stable cardiomediastinal silhouette. Prior CABG.  IMPRESSION: Interval removal of the left-sided chest tube and mediastinal drain. No left pneumothorax.  Left basilar atelectasis with improved aeration.   Electronically Signed   By: Elige KoHetal  Patel   On: 05/28/2014 08:29     Assessment/Plan: S/P Procedure(s) (LRB): MITRAL VALVE REPAIR (MVR) WITH CLOSURE OF PATENT FORAMEN OVALE (N/A) CORONARY ARTERY BYPASS GRAFTING (CABG)TIMES 2 USING LEFT INTERNAL MAMMARY AND LEFT SAPHENOUS VEIN HARVESTED ENDOSCOPICALLY (N/A) TRANSESOPHAGEAL ECHOCARDIOGRAM (TEE) (N/A) Mobilize Diuresis Expected Acute  Blood - loss Anemia thrombocytopenia Renal function stable On coumadin INR 1.07,  To stepdown soon  Noelene Gang B 05/29/2014 7:45 AM

## 2014-05-29 NOTE — Progress Notes (Signed)
CT surgery p.m. Rounds  Patient examined and record reviewed.Hemodynamics stable,labs satisfactory.Patient had stable day.Continue current care. Trevor Aguilar,Trevor Aguilar 05/29/2014   

## 2014-05-29 NOTE — Anesthesia Postprocedure Evaluation (Signed)
  Anesthesia Post-op Note  Patient: Trevor Aguilar  Procedure(s) Performed: Procedure(s): MITRAL VALVE REPAIR (MVR) WITH CLOSURE OF PATENT FORAMEN OVALE (N/A) CORONARY ARTERY BYPASS GRAFTING (CABG)TIMES 2 USING LEFT INTERNAL MAMMARY AND LEFT SAPHENOUS VEIN HARVESTED ENDOSCOPICALLY (N/A) TRANSESOPHAGEAL ECHOCARDIOGRAM (TEE) (N/A)  Patient Location: ICU  Anesthesia Type:General  Level of Consciousness: sedated  Airway and Oxygen Therapy: Patient remains intubated per anesthesia plan  Post-op Pain: none  Post-op Assessment: Post-op Vital signs reviewed, Patient's Cardiovascular Status Stable and Respiratory Function Stable  Post-op Vital Signs: Reviewed and stable  Last Vitals:  Filed Vitals:   05/29/14 0700  BP: 123/55  Pulse: 54  Temp:   Resp: 13    Complications: No apparent anesthesia complications

## 2014-05-30 ENCOUNTER — Inpatient Hospital Stay (HOSPITAL_COMMUNITY): Payer: Medicare Other

## 2014-05-30 LAB — GLUCOSE, CAPILLARY
Glucose-Capillary: 83 mg/dL (ref 70–99)
Glucose-Capillary: 91 mg/dL (ref 70–99)
Glucose-Capillary: 99 mg/dL (ref 70–99)

## 2014-05-30 LAB — BASIC METABOLIC PANEL
Anion gap: 7 (ref 5–15)
BUN: 22 mg/dL (ref 6–23)
CO2: 29 mmol/L (ref 19–32)
Calcium: 8 mg/dL — ABNORMAL LOW (ref 8.4–10.5)
Chloride: 100 mEq/L (ref 96–112)
Creatinine, Ser: 1.06 mg/dL (ref 0.50–1.35)
GFR calc Af Amer: 77 mL/min — ABNORMAL LOW (ref >90)
GFR calc non Af Amer: 66 mL/min — ABNORMAL LOW (ref >90)
Glucose, Bld: 98 mg/dL (ref 70–99)
Potassium: 3.1 mmol/L — ABNORMAL LOW (ref 3.5–5.1)
Sodium: 136 mmol/L (ref 135–145)

## 2014-05-30 LAB — PROTIME-INR
INR: 1.2 (ref 0.00–1.49)
Prothrombin Time: 15.3 seconds — ABNORMAL HIGH (ref 11.6–15.2)

## 2014-05-30 LAB — CBC
HCT: 25.2 % — ABNORMAL LOW (ref 39.0–52.0)
Hemoglobin: 8.3 g/dL — ABNORMAL LOW (ref 13.0–17.0)
MCH: 30 pg (ref 26.0–34.0)
MCHC: 32.9 g/dL (ref 30.0–36.0)
MCV: 91 fL (ref 78.0–100.0)
Platelets: 117 10*3/uL — ABNORMAL LOW (ref 150–400)
RBC: 2.77 MIL/uL — ABNORMAL LOW (ref 4.22–5.81)
RDW: 15.3 % (ref 11.5–15.5)
WBC: 11.1 10*3/uL — ABNORMAL HIGH (ref 4.0–10.5)

## 2014-05-30 MED ORDER — AMIODARONE HCL 200 MG PO TABS
200.0000 mg | ORAL_TABLET | Freq: Two times a day (BID) | ORAL | Status: DC
  Administered 2014-05-30 – 2014-06-02 (×7): 200 mg via ORAL
  Filled 2014-05-30 (×8): qty 1

## 2014-05-30 MED ORDER — MOVING RIGHT ALONG BOOK
Freq: Once | Status: AC
Start: 2014-05-30 — End: 2014-05-30
  Administered 2014-05-30: 18:00:00
  Filled 2014-05-30: qty 1

## 2014-05-30 MED ORDER — POTASSIUM CHLORIDE CRYS ER 20 MEQ PO TBCR
40.0000 meq | EXTENDED_RELEASE_TABLET | Freq: Three times a day (TID) | ORAL | Status: AC
  Administered 2014-05-30 (×3): 40 meq via ORAL
  Filled 2014-05-30 (×3): qty 2

## 2014-05-30 MED ORDER — SODIUM CHLORIDE 0.9 % IJ SOLN: 3.0000 mL | INTRAMUSCULAR | Status: DC | PRN

## 2014-05-30 MED ORDER — SODIUM CHLORIDE 0.9 % IJ SOLN
3.0000 mL | Freq: Two times a day (BID) | INTRAMUSCULAR | Status: DC
  Administered 2014-05-30 – 2014-06-01 (×5): 3 mL via INTRAVENOUS

## 2014-05-30 MED ORDER — ALUM & MAG HYDROXIDE-SIMETH 200-200-20 MG/5ML PO SUSP: 15.0000 mL | ORAL | Status: DC | PRN

## 2014-05-30 MED ORDER — POTASSIUM CHLORIDE CRYS ER 20 MEQ PO TBCR
40.0000 meq | EXTENDED_RELEASE_TABLET | Freq: Two times a day (BID) | ORAL | Status: DC
  Administered 2014-05-31 – 2014-06-02 (×5): 40 meq via ORAL
  Filled 2014-05-30 (×7): qty 2

## 2014-05-30 MED ORDER — SODIUM CHLORIDE 0.9 % IV SOLN: 250.0000 mL | INTRAVENOUS | Status: DC | PRN

## 2014-05-30 MED ORDER — ASPIRIN EC 81 MG PO TBEC
81.0000 mg | DELAYED_RELEASE_TABLET | Freq: Every day | ORAL | Status: DC
  Administered 2014-06-01 – 2014-06-02 (×2): 81 mg via ORAL
  Filled 2014-05-30 (×3): qty 1

## 2014-05-30 MED ORDER — MAGNESIUM HYDROXIDE 400 MG/5ML PO SUSP: 30.0000 mL | Freq: Every day | ORAL | Status: DC | PRN

## 2014-05-30 NOTE — Progress Notes (Signed)
UR Completed.  Trevor Aguilar Jane [phone]  

## 2014-05-30 NOTE — Progress Notes (Signed)
5 Days Post-Op Procedure(s) (LRB): MITRAL VALVE REPAIR (MVR) WITH CLOSURE OF PATENT FORAMEN OVALE (N/A) CORONARY ARTERY BYPASS GRAFTING (CABG)TIMES 2 USING LEFT INTERNAL MAMMARY AND LEFT SAPHENOUS VEIN HARVESTED ENDOSCOPICALLY (N/A) TRANSESOPHAGEAL ECHOCARDIOGRAM (TEE) (N/A) Subjective: Some discomfort  Objective: Vital signs in last 24 hours: Temp:  [97.5 F (36.4 C)-98.1 F (36.7 C)] 97.5 F (36.4 C) (12/22 0700) Pulse Rate:  [52-100] 55 (12/22 0700) Cardiac Rhythm:  [-] Sinus bradycardia (12/22 0735) Resp:  [12-28] 16 (12/22 0700) BP: (107-135)/(45-62) 119/56 mmHg (12/22 0700) SpO2:  [94 %-100 %] 98 % (12/22 0700) Weight:  [210 lb 1.6 oz (95.3 kg)] 210 lb 1.6 oz (95.3 kg) (12/22 0600)  Hemodynamic parameters for last 24 hours:    Intake/Output from previous day: 12/21 0701 - 12/22 0700 In: 240 [P.O.:240] Out: 1225 [Urine:1225] Intake/Output this shift:    General appearance: alert and cooperative Neurologic: intact Heart: brady, slightly irregular Lungs: diminished breath sounds bibasilar Extremities: edema 2+  Lab Results:  Recent Labs  05/29/14 0330 05/30/14 0320  WBC 11.7* 11.1*  HGB 8.4* 8.3*  HCT 24.9* 25.2*  PLT 93* 117*   BMET:  Recent Labs  05/29/14 0330 05/30/14 0320  NA 137 136  K 3.5* 3.1*  CL 98 100  CO2 30 29  GLUCOSE 94 98  BUN 28* 22  CREATININE 1.06 1.06  CALCIUM 8.2* 8.0*    PT/INR:  Recent Labs  05/30/14 0320  LABPROT 15.3*  INR 1.20   ABG    Component Value Date/Time   PHART 7.380 05/26/2014 0358   HCO3 24.4* 05/26/2014 0358   TCO2 22 05/26/2014 1603   ACIDBASEDEF 1.0 05/26/2014 0358   O2SAT 93.0 05/26/2014 0358   CBG (last 3)   Recent Labs  05/29/14 1527 05/29/14 2139 05/30/14 0818  GLUCAP 83 129* 83    Assessment/Plan: S/P Procedure(s) (LRB): MITRAL VALVE REPAIR (MVR) WITH CLOSURE OF PATENT FORAMEN OVALE (N/A) CORONARY ARTERY BYPASS GRAFTING (CABG)TIMES 2 USING LEFT INTERNAL MAMMARY AND LEFT SAPHENOUS  VEIN HARVESTED ENDOSCOPICALLY (N/A) TRANSESOPHAGEAL ECHOCARDIOGRAM (TEE) (N/A) Plan for transfer to step-down: see transfer orders   Overall making steady progress CV- s/p CABG, mitral repair  Bradycardic- beta blocker stopped, decrease amioadrone  Coumadin 5 mg daily, INR yet to bump  RESP- continue IS  RENAL- volume overloaded- continue diuresis  Hypokalemia- supplement  ENDO- CBG well controlled, dc levimir and follow, continue SSI  Continue cardiac rehab  Transfer to 2000  Thrombocytopenia resolving   LOS: 5 days    HENDRICKSON,STEVEN C 05/30/2014

## 2014-05-30 NOTE — Progress Notes (Signed)
CARDIAC REHAB PHASE I   PRE:  Rate/Rhythm: 55 SB  BP:  Supine:   Sitting: 116/52  Standing:    SaO2: 98 RA  MODE:  Ambulation: 350 ft   POST:  Rate/Rhythm: 62  BP:  Supine:   Sitting: 130/62  Standing:    SaO2: 97 RA 1350-1450 On arrival pt in bed c/o of his bottom hurting and is sore. He states that he has a fungus on his bottom and has had it two months. Assisted X 1 and used walker to ambulate. Pt leans forward walking, states that he has a umbilical hernia and it makes his not be able to stand up straight. Pt c/o of tiredness and some SOB walking. VS stable after walk. Pt placed in recliner after walk with recliner cushioned and call light in reach. Pt bottom draining yellowish bloody fluid.  Melina CopaLisa Mertie Haslem RN 05/30/2014 2:51 PM

## 2014-05-31 ENCOUNTER — Inpatient Hospital Stay (HOSPITAL_COMMUNITY): Payer: Medicare Other

## 2014-05-31 LAB — GLUCOSE, CAPILLARY
GLUCOSE-CAPILLARY: 94 mg/dL (ref 70–99)
Glucose-Capillary: 122 mg/dL — ABNORMAL HIGH (ref 70–99)
Glucose-Capillary: 113 mg/dL — ABNORMAL HIGH (ref 70–99)
Glucose-Capillary: 103 mg/dL — ABNORMAL HIGH (ref 70–99)
Glucose-Capillary: 108 mg/dL — ABNORMAL HIGH (ref 70–99)

## 2014-05-31 LAB — BASIC METABOLIC PANEL
Anion gap: 7 (ref 5–15)
BUN: 18 mg/dL (ref 6–23)
CHLORIDE: 100 meq/L (ref 96–112)
CO2: 30 mmol/L (ref 19–32)
Calcium: 8.1 mg/dL — ABNORMAL LOW (ref 8.4–10.5)
Creatinine, Ser: 1.11 mg/dL (ref 0.50–1.35)
GFR calc Af Amer: 73 mL/min — ABNORMAL LOW (ref >90)
GFR, EST NON AFRICAN AMERICAN: 63 mL/min — AB (ref >90)
GLUCOSE: 120 mg/dL — AB (ref 70–99)
Potassium: 3.9 mmol/L (ref 3.5–5.1)
Sodium: 137 mmol/L (ref 135–145)

## 2014-05-31 LAB — CBC
HEMATOCRIT: 25.2 % — AB (ref 39.0–52.0)
HEMOGLOBIN: 8.3 g/dL — AB (ref 13.0–17.0)
MCH: 30.2 pg (ref 26.0–34.0)
MCHC: 32.9 g/dL (ref 30.0–36.0)
MCV: 91.6 fL (ref 78.0–100.0)
Platelets: 140 10*3/uL — ABNORMAL LOW (ref 150–400)
RBC: 2.75 MIL/uL — ABNORMAL LOW (ref 4.22–5.81)
RDW: 15.5 % (ref 11.5–15.5)
WBC: 9.9 10*3/uL (ref 4.0–10.5)

## 2014-05-31 LAB — PROTIME-INR
INR: 1.42 (ref 0.00–1.49)
Prothrombin Time: 17.5 seconds — ABNORMAL HIGH (ref 11.6–15.2)

## 2014-05-31 NOTE — Care Management Note (Signed)
    Page 1 of 1   05/31/2014     2:32:46 PM CARE MANAGEMENT NOTE 05/31/2014  Patient:  Larkin InaHODGSON,Chetan   Account Number:  [id number]  Date Initiated:  05/26/2014  Documentation initiated by:  MAYO,HENRIETTA  Subjective/Objective Assessment:   s/p MVR/CABG; lives with spouse    PCP  Antony HasteMichael Badger     Action/Plan:   Anticipated DC Date:  06/01/2014   Anticipated DC Plan:  HOME W HOME HEALTH SERVICES      DC Planning Services  CM consult      Choice offered to / List presented to:             Status of service:  In process, will continue to follow Medicare Important Message given?  YES (If response is "NO", the following Medicare IM given date fields will be blank) Date Medicare IM given:  05/31/2014 Medicare IM given by:  Donn PieriniWEBSTER,Ruchi Stoney Date Additional Medicare IM given:   Additional Medicare IM given by:    Discharge Disposition:    Per UR Regulation:  Reviewed for med. necessity/level of care/duration of stay  If discussed at Long Length of Stay Meetings, dates discussed:   06/01/2014    Comments:  Hendricks LimesHodgson,Jo Spouse  #[phone], [phone]

## 2014-05-31 NOTE — Progress Notes (Signed)
CARDIAC REHAB PHASE I   PRE:  Rate/Rhythm: 58 SB  BP:  Supine:   Sitting: 120/70  Standing:    SaO2: 97 RA  MODE:  Ambulation: 350 ft   POST:  Rate/Rhythm:   BP:  Supine:   Sitting: 130/70  Standing:    SaO2: 94 RA 1105-1140 On arrival pt reluctant to walk c/o of right leg swollen and painful at incision site. Assisted X 1 and used walker to ambulate. Gait steady with walker. Pt leans forward walking even more today. He was able to walk 350 feet, slow pace. Pt back to recliner after walk with call light in reach. His bottom is still draining from fungus that he had prior to admission. Melina CopaLisa Mckaylee Dimalanta RN 05/31/2014 11:38 AM

## 2014-05-31 NOTE — Progress Notes (Addendum)
[address]       [place] [id number]             [phone]          6 Days Post-Op Procedure(s) (LRB): MITRAL VALVE REPAIR (MVR) WITH CLOSURE OF PATENT FORAMEN OVALE (N/A) CORONARY ARTERY BYPASS GRAFTING (CABG)TIMES 2 USING LEFT INTERNAL MAMMARY AND LEFT SAPHENOUS VEIN HARVESTED ENDOSCOPICALLY (N/A) TRANSESOPHAGEAL ECHOCARDIOGRAM (TEE) (N/A)  Subjective: Developed acute nausea and vomiting after breakfast this morning, had felt well prior to that. Bowels have been working.     Objective: Vital signs in last 24 hours: Patient Vitals for the past 24 hrs:  BP Temp Temp src Pulse Resp SpO2 Weight  05/31/14 0607 130/75 mmHg 98.9 F (37.2 C) Oral 63 18 98 % 209 lb 10.5 oz (95.1 kg)  05/30/14 2226 - - Oral - 18 - -  05/30/14 2222 (!) 126/53 mmHg 98.3 F (36.8 C) Oral (!) 56 18 96 % -  05/30/14 1336 (!) 123/53 mmHg 98.5 F (36.9 C) Oral (!) 56 17 97 % -  05/30/14 1107 (!) 138/56 mmHg 97.5 F (36.4 C) Oral (!) 56 18 99 % -  05/30/14 1000 - - - (!) 57 18 99 % -  05/30/14 0900 (!) 120/53 mmHg - - (!) 55 13 100 % -   Current Weight  05/31/14 209 lb 10.5 oz (95.1 kg)  BASELINE WEIGHT:94 kg   Intake/Output from previous day: 12/22 0701 - 12/23 0700 In: 480 [P.O.:480] Out: 150 [Urine:150]  CBGs 120-113   PHYSICAL EXAM:  Heart: bradycardic in 50-60s Lungs: Diminished BS in bases bilaterally Wound: Clean and dry Extremities: + bilateral LE edema    Lab Results: CBC: Recent Labs  05/30/14 0320 05/31/14 0310  WBC 11.1* 9.9  HGB 8.3* 8.3*  HCT 25.2* 25.2*  PLT 117* 140*   BMET:  Recent Labs  05/30/14 0320 05/31/14 0310  NA 136 137  K 3.1* 3.9  CL 100 100  CO2 29 30  GLUCOSE 98 120*  BUN 22 18  CREATININE 1.06 1.11  CALCIUM 8.0* 8.1*    PT/INR:  Recent Labs  05/31/14 0310  LABPROT 17.5*  INR 1.42    CXR: FINDINGS: Temporary pacemaker wires remain attached to the right heart. A small amount of air in the inferior  anterior mediastinum is felt to be of postoperative etiology.  There is underlying emphysema. There is mild bibasilar scarring. There is no edema or consolidation. The heart size and pulmonary vascularity are within normal limits. Patient is status post mitral valve replacement and coronary artery bypass grafting. There is degenerative change in the thoracic spine. No adenopathy. There are foci of carotid artery calcification bilaterally.  IMPRESSION: No pneumothorax. A small amount of pneumomediastinum inferiorly is felt to be of postoperative etiology. Underlying emphysema with mild scarring in the bases present. No edema or consolidation. Foci of carotid artery calcification present.   Assessment/Plan: S/P Procedure(s) (LRB): MITRAL VALVE REPAIR (MVR) WITH CLOSURE OF PATENT FORAMEN OVALE (N/A) CORONARY ARTERY BYPASS GRAFTING (CABG)TIMES 2 USING LEFT INTERNAL MAMMARY AND LEFT SAPHENOUS VEIN HARVESTED ENDOSCOPICALLY (N/A) TRANSESOPHAGEAL ECHOCARDIOGRAM (TEE) (N/A)  CV- still bradycardic in 50-60s, BPs stable. Beta blocker d/c'ed yesterday, on low dose Amiodarone. Continue to monitor HR/rhythm. Coumadin loading.  Vol overload- diurese.  Expected postop blood loss anemia- H/H stable, continue Fe.  Thrombocytopenia- plts improving.  Continue to monitor.  CRPI, pulm toilet.     LOS: 6 days  COLLINS,GINA H 05/31/2014  Lab Results  Component Value Date   INR 1.42 05/31/2014   INR 1.20 05/30/2014   INR 1.07 05/29/2014   vomiting improved, patient now eating large green spinach salad I have seen and examined Trevor Aguilar and agree with the above assessment  and plan.  Delight OvensEdward B Daylene Vandenbosch MD [facility] [phone] Office [phone] 05/31/2014 1:48 PM

## 2014-05-31 NOTE — Discharge Instructions (Addendum)
Information on my medicine - Coumadin   (Warfarin)  This medication education was reviewed with me or my healthcare representative as part of my discharge preparation.  The pharmacist that spoke with me during my hospital stay was:  Lawson Radar, [facility]  Why was Coumadin prescribed for you? Coumadin was prescribed for you because you have a blood clot or a medical condition that can cause an increased risk of forming blood clots. Blood clots can cause serious health problems by blocking the flow of blood to the heart, lung, or brain. Coumadin can prevent harmful blood clots from forming. As a reminder your indication for Coumadin is:   Stroke Prevention Because Of Atrial Fibrillation  What test will check on my response to Coumadin? While on Coumadin (warfarin) you will need to have an INR test regularly to ensure that your dose is keeping you in the desired range. The INR (international normalized ratio) number is calculated from the result of the laboratory test called prothrombin time (PT).  If an INR APPOINTMENT HAS NOT ALREADY BEEN MADE FOR YOU please schedule an appointment to have this lab work done by your health care provider within 7 days. Your INR goal is usually a number between:  2 to 3 or your provider may give you a more narrow range like 2-2.5.  Ask your health care provider during an office visit what your goal INR is.  What  do you need to  know  About  COUMADIN? Take Coumadin (warfarin) exactly as prescribed by your healthcare provider about the same time each day.  DO NOT stop taking without talking to the doctor who prescribed the medication.  Stopping without other blood clot prevention medication to take the place of Coumadin may increase your risk of developing a new clot or stroke.  Get refills before you run out.  What do you do if you miss a dose? If you miss a dose, take it as soon as you remember on the same day then continue your regularly scheduled regimen the  next day.  Do not take two doses of Coumadin at the same time.  Important Safety Information A possible side effect of Coumadin (Warfarin) is an increased risk of bleeding. You should call your healthcare provider right away if you experience any of the following: ? Bleeding from an injury or your nose that does not stop. ? Unusual colored urine (red or dark brown) or unusual colored stools (red or black). ? Unusual bruising for unknown reasons. ? A serious fall or if you hit your head (even if there is no bleeding).  Some foods or medicines interact with Coumadin (warfarin) and might alter your response to warfarin. To help avoid this: ? Eat a balanced diet, maintaining a consistent amount of Vitamin K. ? Notify your provider about major diet changes you plan to make. ? Avoid alcohol or limit your intake to 1 drink for women and 2 drinks for men per day. (1 drink is 5 oz. wine, 12 oz. beer, or 1.5 oz. liquor.)  Make sure that ANY health care provider who prescribes medication for you knows that you are taking Coumadin (warfarin).  Also make sure the healthcare provider who is monitoring your Coumadin knows when you have started a new medication including herbals and non-prescription products.  Coumadin (Warfarin)  Major Drug Interactions  Increased Warfarin Effect Decreased Warfarin Effect  Alcohol (large quantities) Antibiotics (esp. Septra/Bactrim, Flagyl, Cipro) Amiodarone (Cordarone) Aspirin (ASA) Cimetidine (Tagamet) Megestrol (Megace) NSAIDs (ibuprofen,  naproxen, etc.) Piroxicam (Feldene) Propafenone (Rythmol SR) Propranolol (Inderal) Isoniazid (INH) Posaconazole (Noxafil) Barbiturates (Phenobarbital) Carbamazepine (Tegretol) Chlordiazepoxide (Librium) Cholestyramine (Questran) Griseofulvin Oral Contraceptives Rifampin Sucralfate (Carafate) Vitamin K   Coumadin (Warfarin) Major Herbal Interactions  Increased Warfarin Effect Decreased Warfarin Effect   Garlic Ginseng Ginkgo biloba Coenzyme Q10 Green tea St. Johns wort    Coumadin (Warfarin) FOOD Interactions  Eat a consistent number of servings per week of foods HIGH in Vitamin K (1 serving =  cup)  Collards (cooked, or boiled & drained) Kale (cooked, or boiled & drained) Mustard greens (cooked, or boiled & drained) Parsley *serving size only =  cup Spinach (cooked, or boiled & drained) Swiss chard (cooked, or boiled & drained) Turnip greens (cooked, or boiled & drained)  Eat a consistent number of servings per week of foods MEDIUM-HIGH in Vitamin K (1 serving = 1 cup)  Asparagus (cooked, or boiled & drained) Broccoli (cooked, boiled & drained, or raw & chopped) Brussel sprouts (cooked, or boiled & drained) *serving size only =  cup Lettuce, raw (green leaf, endive, romaine) Spinach, raw Turnip greens, raw & chopped   These websites have more information on Coumadin (warfarin):  http://www.king-russell.com/www.coumadin.com; https://www.hines.net/www.ahrq.gov/consumer/coumadin.htm; Endoscopic Saphenous Vein Harvesting Care After Refer to this sheet in the next few weeks. These instructions provide you with information on caring for yourself after your procedure. Your health care provider may also give you more specific instructions. Your treatment has been planned according to current medical practices, but problems sometimes occur. Call your health care provider if you have any problems or questions after your procedure. HOME CARE INSTRUCTIONS Medicine  Take whatever pain medicine your surgeon prescribes. Follow the directions carefully. Do not take over-the-counter pain medicine unless your surgeon says it is okay. Some pain medicine can cause bleeding problems for several weeks after surgery.  Follow your surgeon's instructions about driving. You will probably not be permitted to drive after heart surgery.  Take any medicines your surgeon prescribes. Any medicines you took before your heart surgery should be checked  with your health care provider before you start taking them again. Wound care  If your surgeon has prescribed an elastic bandage or stocking, ask how long you should wear it.  Check the area around your surgical cuts (incisions) whenever your bandages (dressings) are changed. Look for any redness or swelling.  You will need to return to have the stitches (sutures) or staples taken out. Ask your surgeon when to do that.  Ask your surgeon when you can shower or bathe. Activity  Try to keep your legs raised when you are sitting.  Do any exercises your health care providers have given you. These may include deep breathing exercises, coughing, walking, or other exercises. SEEK MEDICAL CARE IF:  You have any questions about your medicines.  You have more leg pain, especially if your pain medicine stops working.  New or growing bruises develop on your leg.  Your leg swells, feels tight, or becomes red.  You have numbness in your leg. SEEK IMMEDIATE MEDICAL CARE IF:  Your pain gets much worse.  Blood or fluid leaks from any of the incisions.  Your incisions become warm, swollen, or red.  You have chest pain.  You have trouble breathing.  You have a fever.  You have more pain near your leg incision. MAKE SURE YOU:  Understand these instructions.  Will watch your condition.  Will get help right away if you are not doing well or get worse. Document  Released: 02/05/2011 Document Revised: 05/31/2013 Document Reviewed: 02/05/2011 [facility] Patient Information 2015 [place], [place]. This information is not intended to replace advice given to you by your health care provider. Make sure you discuss any questions you have with your health care provider. Coronary Artery Bypass Grafting, Care After These instructions give you information on caring for yourself after your procedure. Your doctor may also give you more specific instructions. Call your doctor if you have any problems or  questions after your procedure.  HOME CARE  Only take medicine as told by your doctor. Take medicines exactly as told. Do not stop taking medicines or start any new medicines without talking to your doctor first.  Take your pulse as told by your doctor.  Do deep breathing as told by your doctor. Use your breathing device (incentive spirometer), if given, to practice deep breathing several times a day. Support your chest with a pillow or your arms when you take deep breaths or cough.  Keep the area clean, dry, and protected where the surgery cuts (incisions) were made. Remove bandages (dressings) only as told by your doctor. If strips were applied to surgical area, do not take them off. They fall off on their own.  Check the surgery area daily for puffiness (swelling), redness, or leaking fluid.  If surgery cuts were made in your legs:  Avoid crossing your legs.  Avoid sitting for long periods of time. Change positions every 30 minutes.  Raise your legs when you are sitting. Place them on pillows.  Wear stockings that help keep blood clots from forming in your legs (compression stockings).  Only take sponge baths until your doctor says it is okay to take showers. Pat the surgery area dry. Do not rub the surgery area with a washcloth or towel. Do not bathe, swim, or use a hot tub until your doctor says it is okay.  Eat foods that are high in fiber. These include raw fruits and vegetables, whole grains, beans, and nuts. Choose lean meats. Avoid canned, processed, and fried foods.  Drink enough fluids to keep your pee (urine) clear or pale yellow.  Weigh yourself every day.  Rest and limit activity as told by your doctor. You may be told to:  Stop any activity if you have chest pain, shortness of breath, changes in heartbeat, or dizziness. Get help right away if this happens.  Move around often for short amounts of time or take short walks as told by your doctor. Gradually become more  active. You may need help to strengthen your muscles and build endurance.  Avoid lifting, pushing, or pulling anything heavier than 10 pounds (4.5 kg) for at least 6 weeks after surgery.  Do not drive until your doctor says it is okay.  Ask your doctor when you can go back to work.  Ask your doctor when you can begin sexual activity again.  Follow up with your doctor as told. GET HELP IF:  You have puffiness, redness, more pain, or fluid draining from the incision site.  You have a fever.  You have puffiness in your ankles or legs.  You have pain in your legs.  You gain 2 or more pounds (0.9 kg) a day.  You feel sick to your stomach (nauseous) or throw up (vomit).  You have watery poop (diarrhea). GET HELP RIGHT AWAY IF:  You have chest pain that goes to your jaw or arms.  You have shortness of breath.  You have a fast or irregular  heartbeat.  You notice a "clicking" in your breastbone when you move.  You have numbness or weakness in your arms or legs.  You feel dizzy or light-headed. MAKE SURE YOU:  Understand these instructions.  Will watch your condition.  Will get help right away if you are not doing well or get worse. Document Released: 05/31/2013 Document Reviewed: 05/31/2013 [facility] Patient Information 2015 [place], [place]. This information is not intended to replace advice given to you by your health care provider. Make sure you discuss any questions you have with your health care provider.

## 2014-06-01 LAB — GLUCOSE, CAPILLARY
GLUCOSE-CAPILLARY: 87 mg/dL (ref 70–99)
Glucose-Capillary: 116 mg/dL — ABNORMAL HIGH (ref 70–99)
Glucose-Capillary: 90 mg/dL (ref 70–99)
Glucose-Capillary: 104 mg/dL — ABNORMAL HIGH (ref 70–99)

## 2014-06-01 LAB — PROTIME-INR
INR: 1.76 — AB (ref 0.00–1.49)
Prothrombin Time: 20.7 seconds — ABNORMAL HIGH (ref 11.6–15.2)

## 2014-06-01 MED ORDER — FUROSEMIDE 80 MG PO TABS
80.0000 mg | ORAL_TABLET | Freq: Every day | ORAL | Status: DC
  Administered 2014-06-01 – 2014-06-02 (×2): 80 mg via ORAL
  Filled 2014-06-01 (×2): qty 1

## 2014-06-01 NOTE — Discharge Summary (Signed)
[address]       [place] [id number]             [phone]              Discharge Summary  Name: Trevor Aguilar DOB: June 27, 1935 78 y.o. MRN: [id number]   Admission Date: 05/25/2014 Discharge Date:     Admitting Diagnosis: Severe mitral regurgitation Severe coronary artery disease Patent foramen ovale   Discharge Diagnosis:  Severe mitral regurgitation Severe coronary artery disease Patent foramen ovale Expected postoperative blood loss anemia Postoperative bradycardia  Past Medical History  Diagnosis Date  . Umbilical hernia   . Nonspecific abnormal electrocardiogram (ECG) (EKG)     Pt states he had a cardiac workup and passed all tests (Echo and Stress)  . Hearing loss     bilateral hearing aids  . Bilateral cataracts   . High cholesterol   . Glaucoma   . Pneumonia 1967  . Hypertension   . History of skull fracture 1978    lower skull  . Coronary artery disease 05/12/2014    High grade proximal LAD stenosis and moderate RCA stenosis  . Pulmonary hypertension 05/12/2014  . Tricuspid regurgitation 05/02/2014    Moderate TR by transthoracic ECHO  . Mitral regurgitation 05/12/2014    Mitral valve prolapse with flail segment of posterior leaflet and severe mitral regurgitation   . Aortic valve sclerosis 05/11/2014    Aortic valve sclerosis w/out stenosis  . Acute on chronic diastolic congestive heart failure 05/18/2014  . Chronic diastolic congestive heart failure 05/18/2014  . Heart murmur   . Shortness of breath dyspnea     with ambulation  . Rash, skin Dec 2015    near buttocks; PCP aware   . S/P mitral valve repair 05/25/2014    Complex valvuloplasty including triangular resection of flail segment of posterior leaflet, artificial Goretex neochord placement x2 and 28 mm Sorin Memo 3D Rechord ring annuloplasty   . S/P CABG x 2 05/25/2014    LIMA to LAD, SVG to RCA, EVH via bilateral thighs       Procedures: MITRAL VALVE  REPAIR - 05/25/2014  Complex valvuloplasty including triangular resection of posterior leaflet  Artificial Goretex neochord placement x 2  Sorin Memo 3D ring annuloplasty (size 28mm) CLOSURE OF PATENT FORAMEN OVALE CORONARY ARTERY BYPASS GRAFTING x 2  Left internal mammary artery to distal left anterior descending  Saphenous vein graft to distal right coronary artery  Endoscopic vein harvest bilateral thighs   HPI:  The patient is a 78 y.o. male referred by Dr. Jacinto HalimGanji surgical treatment of severe symptomatic mitral regurgitation. The patient has history of hypertension, hyperlipidemia, peripheral arterial disease and multi-focal PVCs without any previous history of coronary artery disease, heart murmur or congestive heart failure. Approximately 2 years ago, he saw Dr. Jacinto HalimGanji and underwent a fairly complete cardiac workup to evaluate irregular heart rhythm. He was noted to have frequent PVCs. Transthoracic echocardiogram performed at that time was reportedly normal. The patient additionally had a nuclear stress test at that time that was felt to be low risk. Approximately a year and a half ago, the patient began to experience slow functional decline that initially began with severe problems with his eyesight because of cataracts and glaucoma. His physical activity decreased. He began to experience some progressive exertional fatigue and shortness of breath. He was recently seen by a physician assistant at his primary care physician's office and noted to have a loud  murmur on physical exam. He was referred back to Dr. Jacinto Halim who performed a transthoracic echocardiogram in the office on 05/02/2014. By report, this echocardiogram demonstrated the presence of mildly depressed left ventricular systolic function with ejection fraction estimated 51%. There was mitral valve prolapse with a flail segment of the posterior leaflet and severe mitral regurgitation. There was moderate tricuspid regurgitation and  moderate pulmonary hypertension. At that time the patient was started on metoprolol and losartan. Metoprolol was later discontinued because of acute exacerbation of shortness of breath with leg swelling and leg pain. Lasix has been added recently for treatment of worsening shortness of breath and lower extremity swelling. He was recently started on amiodarone. The patient underwent transesophageal echocardiogram on 05/11/2014. This confirmed the presence of flail segment of the posterior leaflet with severe mitral regurgitation. Left and right heart catheterization was performed 05/12/2014. This was reported to demonstrate severe single-vessel coronary artery disease with long segment 80-85% stenosis of the proximal left anterior descending coronary artery. There was severe pulmonary hypertension. The patient was referred to Dr. Cornelius Moras for surgical consultation.  The patient is married and lives locally in [place] with his wife. 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 The patient states that over the past year and a half, his ability to work has decreased precipitously. This initially developed because of poor eyesight. The patient later began to experience exertional fatigue and some shortness of breath. The patient's symptoms of exertional shortness of breath had become acutely worse over the past 2 weeks. He now gets short of breath with minimal activity and occasionally at rest. He cannot lie flat in bed. He has had recent acute exacerbation of lower extremity swelling. He denies any chest pain or chest tightness either with activity or rest. He has a long history of irregular heart rhythm without palpitations, dizzy spells, or syncope.  The patient also has recently had problems with skin breakdown over his buttocks for which he has been treated with topical creams at his primary care physician's office.  Dr. Cornelius Moras felt that the patient would benefit from mitral valve repair and CABG at this time. All risks, benefits and alternatives of surgery were explained in detail, and the patient agreed to proceed.    Hospital Course:  The patient was admitted to [facility] on 05/25/2014. The patient was taken to the operating room and underwent the above procedure.    The postoperative course was initially notable for expected postop blood loss anemia which required a transfusion of 1 unit packed red blood cells.  He was noted to have bigeminy, bradycardia and hypotension and was DDD paced. His rhythm did stabilize and the pacer was discontinued. He remained bradycardic with heart rates in the 50-60s, therefore, a beta blocker was not started. He was started on Coumadin, and low dose prophylactic Amiodarone. He was able to transfer from the ICU to the stepdown unit on postop day 5.  The patient has continued to progress well.  He has been volume overloaded and was started on Lasix. He did not require further transfusions for anemia, but was started on supplemental iron.  Incisions are healing well.  He is ambulating in the halls with cardiac rehab and improving with mobility. INR continues to trend  up.  He has had no further rhythm issues and blood pressures are stable.  We anticipate discharge home in the next 24-48 hours provided no acute changes occur.      Recent vital signs:  Filed Vitals:   06/01/14 0620  BP: 111/65  Pulse: 70  Temp: 98.3 F (36.8 C)  Resp: 17    Recent laboratory studies:  CBC: Recent Labs  05/30/14 0320 05/31/14 0310  WBC 11.1* 9.9  HGB 8.3* 8.3*  HCT 25.2* 25.2*  PLT 117* 140*   BMET:  Recent Labs  05/30/14 0320 05/31/14 0310  NA 136 137  K 3.1* 3.9  CL 100 100  CO2 29 30  GLUCOSE 98 120*    BUN 22 18  CREATININE 1.06 1.11  CALCIUM 8.0* 8.1*    PT/INR:  Recent Labs  06/01/14 0500  LABPROT 20.7*  INR 1.76*      Medication List    STOP taking these medications        chlorthalidone 25 MG tablet  Commonly known as:  HYGROTON     diclofenac 75 MG EC tablet  Commonly known as:  VOLTAREN     Vitamin D (Ergocalciferol) 50000 UNITS Caps capsule  Commonly known as:  DRISDOL      TAKE these medications        amiodarone 200 MG tablet  Commonly known as:  PACERONE  Take 1 tablet (200 mg total) by mouth 2 (two) times daily.     aspirin 81 MG tablet  Take 81 mg by mouth daily.     DUREZOL 0.05 % Emul  Generic drug:  Difluprednate  Place 1 drop into the left eye 3 (three) times daily.     ferrous fumarate-b12-vitamic C-folic acid capsule  Commonly known as:  TRINSICON / FOLTRIN  Take 1 capsule by mouth daily with breakfast.     fish oil-omega-3 fatty acids 1000 MG capsule  Take 1 g by mouth daily.     furosemide 40 MG tablet  Commonly known as:  LASIX  Take 1 tablet (40 mg total) by mouth daily. For 3 days     ofloxacin 0.3 % ophthalmic solution  Commonly known as:  OCUFLOX  Place 1 drop into the right eye 4 (four) times daily.     oxyCODONE 5 MG immediate release tablet  Commonly known as:  Oxy IR/ROXICODONE  Take 1-2 tablets (5-10 mg total) by mouth every 4 (four) hours as needed for severe pain.     potassium chloride 10 MEQ tablet  Commonly known as:  K-DUR  Take 1 tablet (10 mEq total) by mouth daily.     SIMBRINZA 1-0.2 % Susp  Generic drug:  Brinzolamide-Brimonidine  Place 1 drop into the right eye 3 (three) times daily.     simvastatin 20 MG tablet  Commonly known as:  ZOCOR  Take 20 mg by mouth daily.     tiZANidine 4 MG tablet  Commonly known as:  ZANAFLEX  Take 4 mg by mouth at bedtime.     warfarin 2.5 MG tablet  Commonly known as:  COUMADIN  Take 1 tablet (2.5 mg total) by mouth daily at 6 PM.       Discharge Instructions:   The patient is to refrain from driving, heavy lifting or strenuous activity.  May shower daily and clean incisions with soap and water.  May resume regular diet.   Follow Up: Follow-up Information    Follow up with Purcell NailsWEN,CLARENCE H, MD.  Specialty:  Cardiothoracic Surgery   Why:  Office will contact you with an appointment   Contact information:   [address] [place] [address] [phone]       Follow up with Pamella Pert, MD.   Specialty:  Cardiology   Why:  Please call the office for a 2 week follow up   Contact information:   [address] [place] [address] [phone]       Please follow up.   Why:  Please have bloodwork for Coumadin (PT/INR) checked at Dr. Verl Dicker office early next week       The patient has been discharged on:  1.Beta Blocker: Yes [  ]  No [ x ]  If No, reason: Postop bradycardia and hypotension   2.Ace Inhibitor/ARB: Yes [ ]   No [ x ]  If No, reason: Postop hypotension   3.Statin: Yes [ x ]  No [ ]   If No, reason:    4.Ecasa: Yes [ x ]  No [ ]   If No, reason:     COLLINS,GINA H 06/01/2014, 9:13 AM

## 2014-06-01 NOTE — Progress Notes (Addendum)
       [address]       [place] [id number]             [phone]          7 Days Post-Op Procedure(s) (LRB): MITRAL VALVE REPAIR (MVR) WITH CLOSURE OF PATENT FORAMEN OVALE (N/A) CORONARY ARTERY BYPASS GRAFTING (CABG)TIMES 2 USING LEFT INTERNAL MAMMARY AND LEFT SAPHENOUS VEIN HARVESTED ENDOSCOPICALLY (N/A) TRANSESOPHAGEAL ECHOCARDIOGRAM (TEE) (N/A)  Subjective: No nausea since early yesterday.  Tolerating meals without problem. Main complaint is leg swelling.    Objective: Vital signs in last 24 hours: Patient Vitals for the past 24 hrs:  BP Temp Temp src Pulse Resp SpO2 Weight  06/01/14 0620 111/65 mmHg 98.3 F (36.8 C) Oral 70 17 98 % 206 lb 5.6 oz (93.6 kg)  05/31/14 2252 138/60 mmHg - - - - - -  05/31/14 1351 108/63 mmHg 98.2 F (36.8 C) Oral 68 16 97 % -   Current Weight  06/01/14 206 lb 5.6 oz (93.6 kg)  BASELINE WEIGHT:94 kg   Intake/Output from previous day: 12/23 0701 - 12/24 0700 In: 483 [P.O.:480; I.V.:3] Out: 1300 [Urine:1300]  CBGs [phone]   PHYSICAL EXAM:  Heart: RRR Lungs: Clear Wound: Clean and dry Extremities: +LE edema    Lab Results: CBC: Recent Labs  05/30/14 0320 05/31/14 0310  WBC 11.1* 9.9  HGB 8.3* 8.3*  HCT 25.2* 25.2*  PLT 117* 140*   BMET:  Recent Labs  05/30/14 0320 05/31/14 0310  NA 136 137  K 3.1* 3.9  CL 100 100  CO2 29 30  GLUCOSE 98 120*  BUN 22 18  CREATININE 1.06 1.11  CALCIUM 8.0* 8.1*    PT/INR:  Recent Labs  06/01/14 0500  LABPROT 20.7*  INR 1.76*      Assessment/Plan: S/P Procedure(s) (LRB): MITRAL VALVE REPAIR (MVR) WITH CLOSURE OF PATENT FORAMEN OVALE (N/A) CORONARY ARTERY BYPASS GRAFTING (CABG)TIMES 2 USING LEFT INTERNAL MAMMARY AND LEFT SAPHENOUS VEIN HARVESTED ENDOSCOPICALLY (N/A) TRANSESOPHAGEAL ECHOCARDIOGRAM (TEE) (N/A)  CV- HR improving, BPs stable. Beta blocker d/c'ed due to bradycardia, continue low dose Amiodarone. Coumadin trending up.  Vol  overload- still quite edematous, although wt reportedly below pre-op wt.  Will increase Lasix dose today and watch.  Expected postop blood loss anemia- H/H stable, continue Fe.  CRPI, pulm toilet.  Home 1-2 days if he remains stable.   LOS: 7 days    COLLINS,GINA H 06/01/2014  Patient seen and examined, agree with above

## 2014-06-01 NOTE — Progress Notes (Signed)
06/01/2014 2:58 PM EPW D/C'd per order and per protocol.  Ends intact. Pt. Tolerated well.  Advised bedrest x1hr.  Call bell in reach.  Vital signs collected per protocol. CTS D/C'd per orders.  Benzoin and steri strips applied.  Pt. Tolerated well. Kathryne HitchAllen, Tyesha Joffe C

## 2014-06-01 NOTE — Progress Notes (Addendum)
CARDIAC REHAB PHASE I   PRE:  Rate/Rhythm: 61 SR with PVC's  BP:  Supine:   Sitting: 102/60  Standing:    SaO2: 97 RA  MODE:  Ambulation: 500 ft   POST:  Rate/Rhythm: 79  BP:  Supine:   Sitting: 144/62  Standing:    SaO2: 97 RA [id number] On arrival pt in recliner c/o of bottom hurting and wanting to go to bed. Assisted X 1 and used walker to ambulate. Gait steady with walker. Pt leans forward walking and states he has for the last two years due to a umbilical hernia. Pt was able to walk 500 feet. VS stable Pt to bed after walk with call light in reach, Completed discharge education  with pt. He voices understanding. Pt agrees to Outpt. [facility] in [place], will send referral. I instructed pt how to watch surgery discharge video. He plans to watch with his wife when she comes. Pt's wife has purchased a rolling walker for him to use at home.  Melina CopaLisa Arcadia Gorgas RN 06/01/2014 11:03 AM

## 2014-06-02 LAB — BASIC METABOLIC PANEL
Anion gap: 7 (ref 5–15)
BUN: 21 mg/dL (ref 6–23)
CO2: 30 mmol/L (ref 19–32)
Calcium: 8.5 mg/dL (ref 8.4–10.5)
Chloride: 98 mEq/L (ref 96–112)
Creatinine, Ser: 1.15 mg/dL (ref 0.50–1.35)
GFR calc Af Amer: 69 mL/min — ABNORMAL LOW (ref >90)
GFR, EST NON AFRICAN AMERICAN: 60 mL/min — AB (ref >90)
GLUCOSE: 114 mg/dL — AB (ref 70–99)
POTASSIUM: 4.5 mmol/L (ref 3.5–5.1)
Sodium: 135 mmol/L (ref 135–145)

## 2014-06-02 LAB — PROTIME-INR
INR: 2.15 — ABNORMAL HIGH (ref 0.00–1.49)
Prothrombin Time: 24.2 seconds — ABNORMAL HIGH (ref 11.6–15.2)

## 2014-06-02 LAB — GLUCOSE, CAPILLARY
GLUCOSE-CAPILLARY: 118 mg/dL — AB (ref 70–99)
Glucose-Capillary: 106 mg/dL — ABNORMAL HIGH (ref 70–99)

## 2014-06-02 MED ORDER — FUROSEMIDE 40 MG PO TABS: 40.0000 mg | ORAL_TABLET | Freq: Every day | ORAL | Status: DC

## 2014-06-02 MED ORDER — OXYCODONE HCL 5 MG PO TABS: 5.0000 mg | ORAL_TABLET | ORAL | Status: DC | PRN

## 2014-06-02 MED ORDER — WARFARIN SODIUM 2.5 MG PO TABS: 2.5000 mg | ORAL_TABLET | Freq: Every day | ORAL | Status: DC

## 2014-06-02 MED ORDER — AMIODARONE HCL 200 MG PO TABS: 200.0000 mg | ORAL_TABLET | Freq: Two times a day (BID) | ORAL | Status: DC

## 2014-06-02 MED ORDER — POTASSIUM CHLORIDE ER 10 MEQ PO TBCR: 10.0000 meq | EXTENDED_RELEASE_TABLET | Freq: Every day | ORAL | Status: DC

## 2014-06-02 MED ORDER — FE FUMARATE-B12-VIT C-FA-IFC PO CAPS: 1.0000 | ORAL_CAPSULE | Freq: Every day | ORAL | Status: DC

## 2014-06-02 NOTE — Progress Notes (Signed)
      [address]       [place] [id number]             [phone]      8 Days Post-Op Procedure(s) (LRB): MITRAL VALVE REPAIR (MVR) WITH CLOSURE OF PATENT FORAMEN OVALE (N/A) CORONARY ARTERY BYPASS GRAFTING (CABG)TIMES 2 USING LEFT INTERNAL MAMMARY AND LEFT SAPHENOUS VEIN HARVESTED ENDOSCOPICALLY (N/A) TRANSESOPHAGEAL ECHOCARDIOGRAM (TEE) (N/A) Subjective: Feels well, no new issues  Objective: Vital signs in last 24 hours: Temp:  [97.8 F (36.6 C)-99 F (37.2 C)] 97.8 F (36.6 C) (12/25 0509) Pulse Rate:  [63-67] 66 (12/25 0509) Cardiac Rhythm:  [-]  Resp:  [18-20] 20 (12/25 0509) BP: (111-136)/(44-57) 119/54 mmHg (12/25 0509) SpO2:  [94 %-100 %] 94 % (12/25 0509) Weight:  [203 lb 14.4 oz (92.488 kg)] 203 lb 14.4 oz (92.488 kg) (12/25 0509)  Hemodynamic parameters for last 24 hours:    Intake/Output from previous day: 12/24 0701 - 12/25 0700 In: -  Out: 1700 [Urine:1700] Intake/Output this shift:    General appearance: alert, cooperative and no distress Heart: regular rate and rhythm and occ extrasystole Lungs: clear to auscultation bilaterally Abdomen: benign Extremities: + BLE edema Wound: incis healing well  Lab Results:  Recent Labs  05/31/14 0310  WBC 9.9  HGB 8.3*  HCT 25.2*  PLT 140*   BMET:  Recent Labs  05/31/14 0310 06/02/14 0402  NA 137 135  K 3.9 4.5  CL 100 98  CO2 30 30  GLUCOSE 120* 114*  BUN 18 21  CREATININE 1.11 1.15  CALCIUM 8.1* 8.5    PT/INR:  Recent Labs  06/02/14 0402  LABPROT 24.2*  INR 2.15*   ABG    Component Value Date/Time   PHART 7.380 05/26/2014 0358   HCO3 24.4* 05/26/2014 0358   TCO2 22 05/26/2014 1603   ACIDBASEDEF 1.0 05/26/2014 0358   O2SAT 93.0 05/26/2014 0358   CBG (last 3)   Recent Labs  06/01/14 1613 06/01/14 2105 06/02/14 0619  GLUCAP 87 104* 106*    Meds Scheduled Meds: . amiodarone  200 mg Oral BID  . aspirin EC  81 mg Oral Daily  . bisacodyl  10 mg Oral  Daily   Or  . bisacodyl  10 mg Rectal Daily  . Brinzolamide-Brimonidine  1 drop Right Eye TID  . Difluprednate  1 drop Left Eye TID  . docusate sodium  200 mg Oral Daily  . ferrous fumarate-b12-vitamic C-folic acid  1 capsule Oral Q breakfast  . furosemide  80 mg Oral Daily  . insulin aspart  0-15 Units Subcutaneous TID WC  . pantoprazole  40 mg Oral Daily  . potassium chloride  40 mEq Oral BID  . simvastatin  20 mg Oral QHS  . sodium chloride  3 mL Intravenous Q12H  . warfarin  5 mg Oral q1800  . Warfarin - Physician Dosing Inpatient   Does not apply q1800   Continuous Infusions:  PRN Meds:.sodium chloride, alum & mag hydroxide-simeth, magnesium hydroxide, ofloxacin, ondansetron (ZOFRAN) IV, oxyCODONE, polyvinyl alcohol, sodium chloride, tiZANidine, traMADol  Xrays No results found.  Assessment/Plan: S/P Procedure(s) (LRB): MITRAL VALVE REPAIR (MVR) WITH CLOSURE OF PATENT FORAMEN OVALE (N/A) CORONARY ARTERY BYPASS GRAFTING (CABG)TIMES 2 USING LEFT INTERNAL MAMMARY AND LEFT SAPHENOUS VEIN HARVESTED ENDOSCOPICALLY (N/A) TRANSESOPHAGEAL ECHOCARDIOGRAM (TEE) (N/A) Plan for discharge: see discharge orders   LOS: 8 days    Kriste Broman E 06/02/2014

## 2014-06-02 NOTE — Progress Notes (Signed)
Discharge instructions reviewed with patient and family along with wound care and s/s of infection. All questions answered. Patient given prescriptions along with instructions for INR to be checked on 12/28. Patient discharged by NT and family.

## 2014-06-22 ENCOUNTER — Other Ambulatory Visit: Payer: Self-pay | Admitting: Thoracic Surgery (Cardiothoracic Vascular Surgery)

## 2014-06-22 DIAGNOSIS — I340 Nonrheumatic mitral (valve) insufficiency: Secondary | ICD-10-CM

## 2014-06-26 ENCOUNTER — Ambulatory Visit
Admission: RE | Admit: 2014-06-26 | Discharge: 2014-06-26 | Disposition: A | Discharge status: Home / Self Care | Payer: Medicare HMO | Source: Ambulatory Visit | Attending: Thoracic Surgery (Cardiothoracic Vascular Surgery) | Admitting: Thoracic Surgery (Cardiothoracic Vascular Surgery)

## 2014-06-26 ENCOUNTER — Ambulatory Visit (INDEPENDENT_AMBULATORY_CARE_PROVIDER_SITE_OTHER): Payer: Self-pay | Admitting: Physician Assistant

## 2014-06-26 VITALS — BP 114/63 | HR 74 | Resp 20 | Ht 72.0 in | Wt 184.0 lb

## 2014-06-26 DIAGNOSIS — Q211 Atrial septal defect: Secondary | ICD-10-CM

## 2014-06-26 DIAGNOSIS — I059 Rheumatic mitral valve disease, unspecified: Secondary | ICD-10-CM

## 2014-06-26 DIAGNOSIS — Q2112 Patent foramen ovale: Secondary | ICD-10-CM

## 2014-06-26 DIAGNOSIS — Z9889 Other specified postprocedural states: Secondary | ICD-10-CM

## 2014-06-26 DIAGNOSIS — I340 Nonrheumatic mitral (valve) insufficiency: Secondary | ICD-10-CM

## 2014-06-26 DIAGNOSIS — Z951 Presence of aortocoronary bypass graft: Secondary | ICD-10-CM

## 2014-06-26 DIAGNOSIS — I2510 Atherosclerotic heart disease of native coronary artery without angina pectoris: Secondary | ICD-10-CM

## 2014-06-26 NOTE — Progress Notes (Signed)
  HPI: Patient returns for routine postoperative follow-up having undergone MV Repair, CABG x 2, and PFO closure on 05/26/2015.  The patient's early postoperative recovery while in the hospital was notable for Bradycardia. Since hospital discharge the patient reports that he is making progress.  He states that he feels pretty good.  He continues to have lower extremity edema which he states is improving.  I encouraged the patient to utilize TED hose and make sure his legs are elevated at rest.  His INR remains therapeutic, however he is hoping to not be on Coumadin long due to so many medication restrictions.  He states he is scheduled to undergo cardioversion by Dr. Lennox SoldersGhanji tomorrow.   Current Outpatient Prescriptions  Medication Sig Dispense Refill  . amiodarone (PACERONE) 200 MG tablet Take 1 tablet (200 mg total) by mouth 2 (two) times daily. 60 tablet 1  . aspirin 81 MG tablet Take 81 mg by mouth daily.    . Brinzolamide-Brimonidine (SIMBRINZA) 1-0.2 % SUSP Place 1 drop into the right eye 3 (three) times daily.     . Difluprednate (DUREZOL) 0.05 % EMUL Place 1 drop into the left eye 3 (three) times daily.    . ferrous fumarate-b12-vitamic C-folic acid (TRINSICON / FOLTRIN) capsule Take 1 capsule by mouth daily with breakfast. 30 capsule 2  . fish oil-omega-3 fatty acids 1000 MG capsule Take 1 g by mouth daily.    Marland Kitchen. ofloxacin (OCUFLOX) 0.3 % ophthalmic solution Place 1 drop into the right eye 4 (four) times daily.    . simvastatin (ZOCOR) 20 MG tablet Take 20 mg by mouth daily.     Marland Kitchen. spironolactone (ALDACTONE) 25 MG tablet Take 25 mg by mouth daily.    Marland Kitchen. tiZANidine (ZANAFLEX) 4 MG tablet Take 4 mg by mouth at bedtime.    Marland Kitchen. warfarin (COUMADIN) 2.5 MG tablet Take 1 tablet (2.5 mg total) by mouth daily at 6 PM. 100 tablet 1   No current facility-administered medications for this visit.    Physical Exam:  BP 114/63 mmHg  Pulse 74  Resp 20  Ht 6' (1.829 m)  Wt 184 lb (83.462 kg)  BMI 24.95  kg/m2  SpO2 95%  Gen: no apparent distress Heart; RRR Lungs: CTA bilaterally Ext: 1+ pitting edema present Skin: incisions clean and dry, well healed  Diagnostic Tests:  CXR: no evidence of pleural effusions, post surgical changes present  A/P:  1. S/P MV Repair, CABG, closure of PFO- progressing well.  Continues to have issues with LE edema which continues to improve.  Continue Couamdin, INR is therapeutic.  Per patient for cardioversion tomorrow, however appears to be in NSR today. 2. RTC in 6-8 week, unless edema worsens then patient instructed to contact our office or Dr. Oliver BarreGhanji's office for sooner appointment

## 2014-07-04 ENCOUNTER — Ambulatory Visit (HOSPITAL_COMMUNITY): Admission: RE | Admit: 2014-07-04 | Payer: Medicare HMO | Source: Ambulatory Visit | Admitting: Cardiology

## 2014-07-04 ENCOUNTER — Encounter (HOSPITAL_COMMUNITY): Admission: RE | Payer: Self-pay | Source: Ambulatory Visit

## 2014-07-04 SURGERY — CARDIOVERSION
Anesthesia: Monitor Anesthesia Care

## 2014-07-20 ENCOUNTER — Encounter (HOSPITAL_COMMUNITY)
Admission: RE | Admit: 2014-07-20 | Discharge: 2014-07-20 | Disposition: A | Discharge status: Home / Self Care | Payer: Medicare HMO | Source: Ambulatory Visit | Attending: Cardiology | Admitting: Cardiology

## 2014-07-20 DIAGNOSIS — Z951 Presence of aortocoronary bypass graft: Secondary | ICD-10-CM | POA: Insufficient documentation

## 2014-07-20 NOTE — Progress Notes (Signed)
Cardiac Rehab Medication Review by a Pharmacist  Does the patient  feel that his/her medications are working for him/her?  yes  Has the patient been experiencing any side effects to the medications prescribed?  no  Does the patient measure his/her own blood pressure or blood glucose at home?  N/A   Does the patient have any problems obtaining medications due to transportation or finances?   no  Understanding of regimen: good Understanding of indications: good Potential of compliance: excellent   Pharmacist comments: Pt reports only missing one dose of meds over the past 17mo.  Pt and wife seem to have a good understanding of meds, indications, and they carry a current med list.  Questions related to warfarin have been discussed in detail today.  Waynette Butteryegan K. Coleson Kant, PharmD Clinical Pharmacy Resident Pager: [phone] 07/20/2014 9:01 AM

## 2014-07-24 ENCOUNTER — Encounter (HOSPITAL_COMMUNITY): Payer: Medicare HMO

## 2014-07-24 ENCOUNTER — Telehealth (HOSPITAL_COMMUNITY): Payer: Self-pay | Admitting: Nurse Practitioner

## 2014-07-26 ENCOUNTER — Encounter (HOSPITAL_COMMUNITY): Payer: Medicare HMO

## 2014-07-28 ENCOUNTER — Encounter (HOSPITAL_COMMUNITY): Payer: Medicare HMO

## 2014-07-31 ENCOUNTER — Encounter (HOSPITAL_COMMUNITY)
Admission: RE | Admit: 2014-07-31 | Discharge: 2014-07-31 | Disposition: A | Discharge status: Home / Self Care | Payer: Medicare HMO | Source: Ambulatory Visit | Attending: Cardiology | Admitting: Cardiology

## 2014-07-31 DIAGNOSIS — Z951 Presence of aortocoronary bypass graft: Secondary | ICD-10-CM | POA: Diagnosis not present

## 2014-07-31 NOTE — Progress Notes (Signed)
Pt started cardiac rehab today.  Pt tolerated light exercise without difficulty. Patient used a rolling walker for stability and has a hearing deficit. Will speak slowly and loudly so communication is available for the patient. Telemetry rhythm Sinus with frequent PVC and intermittent bigeminy. This has been previously documented. Dr Jacinto HalimGanji is aware of the patient telemetry rhythm and patient is okay to proceed with exercise. Mr Trevor Aguilar is asymptomatic. Vital signs table. PHQ =0. Mikes short and long term goals are to increase his stamina get back to normal. Will continue to monitor the patient throughout  the program.

## 2014-08-02 ENCOUNTER — Encounter (HOSPITAL_COMMUNITY)
Admission: RE | Admit: 2014-08-02 | Discharge: 2014-08-02 | Disposition: A | Discharge status: Home / Self Care | Payer: Medicare HMO | Source: Ambulatory Visit | Attending: Cardiology | Admitting: Cardiology

## 2014-08-02 DIAGNOSIS — Z951 Presence of aortocoronary bypass graft: Secondary | ICD-10-CM | POA: Diagnosis not present

## 2014-08-04 ENCOUNTER — Encounter (HOSPITAL_COMMUNITY)
Admission: RE | Admit: 2014-08-04 | Discharge: 2014-08-04 | Disposition: A | Discharge status: Home / Self Care | Payer: Medicare HMO | Source: Ambulatory Visit | Attending: Cardiology | Admitting: Cardiology

## 2014-08-04 DIAGNOSIS — Z951 Presence of aortocoronary bypass graft: Secondary | ICD-10-CM | POA: Diagnosis not present

## 2014-08-07 ENCOUNTER — Encounter (HOSPITAL_COMMUNITY)
Admission: RE | Admit: 2014-08-07 | Discharge: 2014-08-07 | Disposition: A | Discharge status: Home / Self Care | Payer: Medicare HMO | Source: Ambulatory Visit | Attending: Cardiology | Admitting: Cardiology

## 2014-08-07 DIAGNOSIS — Z951 Presence of aortocoronary bypass graft: Secondary | ICD-10-CM | POA: Diagnosis not present

## 2014-08-07 NOTE — Progress Notes (Signed)
Reviewed home exercise with pt today.  Pt plans to walk and use treadmill at home for exercise.  Reviewed THR, pulse, RPE, sign and symptoms, and when to call 911 or MD.  Pt voiced understanding. Shada Nienaber, MA, ACSM RCEP  

## 2014-08-09 ENCOUNTER — Encounter (HOSPITAL_COMMUNITY)
Admission: RE | Admit: 2014-08-09 | Discharge: 2014-08-09 | Disposition: A | Discharge status: Home / Self Care | Payer: Medicare HMO | Source: Ambulatory Visit | Attending: Cardiology | Admitting: Cardiology

## 2014-08-09 DIAGNOSIS — Z951 Presence of aortocoronary bypass graft: Secondary | ICD-10-CM | POA: Diagnosis not present

## 2014-08-11 ENCOUNTER — Encounter (HOSPITAL_COMMUNITY)
Admission: RE | Admit: 2014-08-11 | Discharge: 2014-08-11 | Disposition: A | Discharge status: Home / Self Care | Payer: Medicare HMO | Source: Ambulatory Visit | Attending: Cardiology | Admitting: Cardiology

## 2014-08-11 DIAGNOSIS — Z951 Presence of aortocoronary bypass graft: Secondary | ICD-10-CM | POA: Diagnosis not present

## 2014-08-14 ENCOUNTER — Encounter (HOSPITAL_COMMUNITY)
Admission: RE | Admit: 2014-08-14 | Discharge: 2014-08-14 | Disposition: A | Discharge status: Home / Self Care | Payer: Medicare HMO | Source: Ambulatory Visit | Attending: Cardiology | Admitting: Cardiology

## 2014-08-14 DIAGNOSIS — Z951 Presence of aortocoronary bypass graft: Secondary | ICD-10-CM | POA: Diagnosis not present

## 2014-08-16 ENCOUNTER — Encounter (HOSPITAL_COMMUNITY)
Admission: RE | Admit: 2014-08-16 | Discharge: 2014-08-16 | Disposition: A | Discharge status: Home / Self Care | Payer: Medicare HMO | Source: Ambulatory Visit | Attending: Cardiology | Admitting: Cardiology

## 2014-08-16 DIAGNOSIS — Z951 Presence of aortocoronary bypass graft: Secondary | ICD-10-CM | POA: Diagnosis not present

## 2014-08-16 NOTE — Progress Notes (Signed)
Pt CBG-60 upon arrival to cardiac rehab today. Pt reports he ate breakfast and lunch today and took insulin/medications as prescribed.  Pt c/o fatigue and sluggishness. Pt given gingerale and banana.  15 minute recheck CBG-94.  Pt reports feeling better. Pt given peanut butter crackers and lemonade.  Pt went to education class and stayed for 2:45 exercise session.  Pre exercise recheck CBG-116.  Pt exercised without difficulty.  Post CBG-90.  Pt given crackers and lemonade.  15 minute recheck CBG-120.  Pt asymptomatic. Dr. Josephine CablesKemp's office notified,  report of findings faxed to Dr. Marden NobleKemp.

## 2014-08-18 ENCOUNTER — Encounter (HOSPITAL_COMMUNITY)
Admission: RE | Admit: 2014-08-18 | Discharge: 2014-08-18 | Disposition: A | Discharge status: Home / Self Care | Payer: Medicare HMO | Source: Ambulatory Visit | Attending: Cardiology | Admitting: Cardiology

## 2014-08-18 DIAGNOSIS — Z951 Presence of aortocoronary bypass graft: Secondary | ICD-10-CM | POA: Diagnosis not present

## 2014-08-21 ENCOUNTER — Ambulatory Visit (INDEPENDENT_AMBULATORY_CARE_PROVIDER_SITE_OTHER): Payer: Self-pay | Admitting: Thoracic Surgery (Cardiothoracic Vascular Surgery)

## 2014-08-21 ENCOUNTER — Encounter: Payer: Self-pay | Admitting: Thoracic Surgery (Cardiothoracic Vascular Surgery)

## 2014-08-21 ENCOUNTER — Encounter (HOSPITAL_COMMUNITY): Payer: Medicare HMO

## 2014-08-21 VITALS — BP 136/68 | HR 64 | Resp 16 | Ht 73.0 in | Wt 192.0 lb

## 2014-08-21 DIAGNOSIS — Z9889 Other specified postprocedural states: Secondary | ICD-10-CM

## 2014-08-21 DIAGNOSIS — I2510 Atherosclerotic heart disease of native coronary artery without angina pectoris: Secondary | ICD-10-CM

## 2014-08-21 DIAGNOSIS — I059 Rheumatic mitral valve disease, unspecified: Secondary | ICD-10-CM

## 2014-08-21 DIAGNOSIS — Z951 Presence of aortocoronary bypass graft: Secondary | ICD-10-CM

## 2014-08-21 DIAGNOSIS — Q2112 Patent foramen ovale: Secondary | ICD-10-CM

## 2014-08-21 DIAGNOSIS — Q211 Atrial septal defect: Secondary | ICD-10-CM

## 2014-08-21 NOTE — Patient Instructions (Signed)
Patient may resume unrestricted physical activity without any particular limitations at this time.   Endocarditis is a potentially serious infection of heart valves or inside lining of the heart.  It occurs more commonly in patients with diseased heart valves (such as patient's with aortic or mitral valve disease) and in patients who have undergone heart valve repair or replacement.  Certain surgical and dental procedures may put you at risk, such as dental cleaning, other dental procedures, or any surgery involving the respiratory, urinary, gastrointestinal tract, gallbladder or prostate gland.   To minimize your chances for develooping endocarditis, maintain good oral health and seek prompt medical attention for any infections involving the mouth, teeth, gums, skin or urinary tract.  Always notify your doctor or dentist about your underlying heart valve condition before having any invasive procedures. You will need to take antibiotics before certain procedures.        

## 2014-08-21 NOTE — Progress Notes (Signed)
[address]       [name] [address]             [phone]     CARDIOTHORACIC SURGERY OFFICE NOTE  Referring Provider is Yates Decamp, MD PCP is Elizabeth Palau, FNP   HPI:  Patient is a 79 year old white male who returns for routine follow-up approximately 3 months status post mitral valve repair, coronary artery bypass grafting 2, and closure of patent foramen ovale on 05/25/2014 for Stage D severe symptomatic primary mitral regurgitation with mild left ventricular systolic dysfunction, moderate to severe pulmonary attention, and two-vessel coronary artery disease.  He developed postoperative atrial fibrillation and was scheduled for elective cardioversion by Dr. Jacinto Halim, but he converted back to sinus rhythm spontaneously.  He was last seen here in our office on 06/26/2014 at which time he was progressing nicely. Since then he has been followed carefully by Dr. Jacinto Halim and he started the outpatient cardiac rehabilitation program approximately 3 weeks ago.  He returns to our office today feeling quite well. He complains that the technologists at the cardiac rehabilitation program will not let him increase the speed or resistance on his exercise machines, and he feels as though he could go much harder and faster. He denies any shortness of breath and feels notably much better than he did prior to surgery. He no longer has any soreness in his chest although he does report some expected numbness along the left anterior chest wall consistent with recent left internal mammary artery harvest and grafting. Overall he is doing exceptionally well and he has no complaints. His prothrombin time and Coumadin dosing are being monitored through Dr. Verl Dicker office.   Current Outpatient Prescriptions  Medication Sig Dispense Refill  . amiodarone (PACERONE) 200 MG tablet Take 1 tablet (200 mg total) by mouth 2 (two) times daily. (Patient taking differently: Take 100 mg by mouth daily. ) 60  tablet 1  . aspirin 81 MG tablet Take 81 mg by mouth daily.    . Difluprednate (DUREZOL) 0.05 % EMUL Place 1 drop into the left eye 3 (three) times daily.    . fish oil-omega-3 fatty acids 1000 MG capsule Take 1 g by mouth daily.    . metoprolol succinate (TOPROL-XL) 25 MG 24 hr tablet Take 25 mg by mouth daily.    . simvastatin (ZOCOR) 20 MG tablet Take 20 mg by mouth daily.     Marland Kitchen warfarin (COUMADIN) 2.5 MG tablet Take 1 tablet (2.5 mg total) by mouth daily at 6 PM. 100 tablet 1  . Brinzolamide-Brimonidine (SIMBRINZA) 1-0.2 % SUSP Place 1 drop into the right eye 3 (three) times daily.      No current facility-administered medications for this visit.      Physical Exam:   BP 136/68 mmHg  Pulse 64  Resp 16  Ht  (1.854 m)  Wt 192 lb (87.091 kg)  BMI 25.34 kg/m2  SpO2 98%  General:  Well-appearing  Chest:   Clear to auscultation  CV:   Regular rate and rhythm without murmur  Incisions:  Well healed, sternum is stable  Abdomen:  Soft and nontender  Extremities:  Warm and well-perfused  Diagnostic Tests:  n/a   Impression:  Patient is doing very well approximately 3 months status post mitral valve repair and coronary artery bypass grafting.   Plan:  I have encouraged the patient to continue to gradually increase his physical activity without any particular limitations at this time. At  some point I think it would be reasonable to check a routine follow-up transthoracic echocardiogram. We will defer this to Dr. Jacinto HalimGanji. Similarly, we will defer the decision as to whether or not Coumadin therapy could be stopped to Dr. Jacinto HalimGanji.  If the patient continues to maintain sinus rhythm off amiodarone therapy I would be in favor of stopping Coumadin within the next month or so. The patient has been reminded regarding the lifelong need for antibiotic prophylaxis for all dental cleaning and related procedures. He will return for routine follow-up next December, approximately 1 year following  his surgery.    Salvatore Decentlarence H. Cornelius Moraswen, MD 08/21/2014 3:17 PM

## 2014-08-23 ENCOUNTER — Encounter (HOSPITAL_COMMUNITY)
Admission: RE | Admit: 2014-08-23 | Discharge: 2014-08-23 | Disposition: A | Discharge status: Home / Self Care | Payer: Medicare HMO | Source: Ambulatory Visit | Attending: Cardiology | Admitting: Cardiology

## 2014-08-23 DIAGNOSIS — Z951 Presence of aortocoronary bypass graft: Secondary | ICD-10-CM | POA: Diagnosis not present

## 2014-08-25 ENCOUNTER — Encounter (HOSPITAL_COMMUNITY)
Admission: RE | Admit: 2014-08-25 | Discharge: 2014-08-25 | Disposition: A | Discharge status: Home / Self Care | Payer: Medicare HMO | Source: Ambulatory Visit | Attending: Cardiology | Admitting: Cardiology

## 2014-08-25 DIAGNOSIS — Z951 Presence of aortocoronary bypass graft: Secondary | ICD-10-CM | POA: Diagnosis not present

## 2014-08-28 ENCOUNTER — Encounter (HOSPITAL_COMMUNITY)
Admission: RE | Admit: 2014-08-28 | Discharge: 2014-08-28 | Disposition: A | Discharge status: Home / Self Care | Payer: Medicare HMO | Source: Ambulatory Visit | Attending: Cardiology | Admitting: Cardiology

## 2014-08-28 DIAGNOSIS — Z951 Presence of aortocoronary bypass graft: Secondary | ICD-10-CM | POA: Diagnosis not present

## 2014-08-30 ENCOUNTER — Encounter (HOSPITAL_COMMUNITY)
Admission: RE | Admit: 2014-08-30 | Discharge: 2014-08-30 | Disposition: A | Discharge status: Home / Self Care | Payer: Medicare HMO | Source: Ambulatory Visit | Attending: Cardiology | Admitting: Cardiology

## 2014-08-30 DIAGNOSIS — Z951 Presence of aortocoronary bypass graft: Secondary | ICD-10-CM | POA: Diagnosis not present

## 2014-08-30 NOTE — Progress Notes (Signed)
Post exercise heart rate noted at 45 post exercise with bigeminal PVC's. Patient asymptomatic. Blood pressure 104/60.  ECG tracings faxed to Dr Verl DickerGanji's office for review. Alecia LemmingBridgett Allison NP reviewed the ECG tracings and said the patient is okay to go home. Patient left cardiac rehab without complaints. Will continue to monitor the patient.

## 2014-09-01 ENCOUNTER — Encounter (HOSPITAL_COMMUNITY)
Admission: RE | Admit: 2014-09-01 | Discharge: 2014-09-01 | Disposition: A | Discharge status: Home / Self Care | Payer: Medicare HMO | Source: Ambulatory Visit | Attending: Cardiology | Admitting: Cardiology

## 2014-09-01 DIAGNOSIS — Z951 Presence of aortocoronary bypass graft: Secondary | ICD-10-CM | POA: Diagnosis not present

## 2014-09-04 ENCOUNTER — Encounter (HOSPITAL_COMMUNITY)
Admission: RE | Admit: 2014-09-04 | Discharge: 2014-09-04 | Disposition: A | Discharge status: Home / Self Care | Payer: Medicare HMO | Source: Ambulatory Visit | Attending: Cardiology | Admitting: Cardiology

## 2014-09-04 DIAGNOSIS — Z951 Presence of aortocoronary bypass graft: Secondary | ICD-10-CM | POA: Diagnosis not present

## 2014-09-04 NOTE — Progress Notes (Signed)
Nonsustained run of bigeminal couplets noted today during exercise today. Patient asymptomatic. Trevor LemmingBridgett Allison NP called and notified. No new orders received. Will fax exercise flow sheets to Dr. Verl DickerGanji's office for review with today's ECG tracing.

## 2014-09-06 ENCOUNTER — Telehealth (HOSPITAL_COMMUNITY): Payer: Self-pay | Admitting: Nurse Practitioner

## 2014-09-06 ENCOUNTER — Encounter (HOSPITAL_COMMUNITY): Payer: Medicare HMO

## 2014-09-08 ENCOUNTER — Encounter (HOSPITAL_COMMUNITY)
Admission: RE | Admit: 2014-09-08 | Discharge: 2014-09-08 | Disposition: A | Discharge status: Home / Self Care | Payer: Medicare HMO | Source: Ambulatory Visit | Attending: Cardiology | Admitting: Cardiology

## 2014-09-08 DIAGNOSIS — Z951 Presence of aortocoronary bypass graft: Secondary | ICD-10-CM | POA: Insufficient documentation

## 2014-09-11 ENCOUNTER — Encounter (HOSPITAL_COMMUNITY)
Admission: RE | Admit: 2014-09-11 | Discharge: 2014-09-11 | Disposition: A | Discharge status: Home / Self Care | Payer: Medicare HMO | Source: Ambulatory Visit | Attending: Cardiology | Admitting: Cardiology

## 2014-09-11 DIAGNOSIS — Z951 Presence of aortocoronary bypass graft: Secondary | ICD-10-CM | POA: Diagnosis not present

## 2014-09-13 ENCOUNTER — Encounter (HOSPITAL_COMMUNITY)
Admission: RE | Admit: 2014-09-13 | Discharge: 2014-09-13 | Disposition: A | Discharge status: Home / Self Care | Payer: Medicare HMO | Source: Ambulatory Visit | Attending: Cardiology | Admitting: Cardiology

## 2014-09-13 DIAGNOSIS — Z951 Presence of aortocoronary bypass graft: Secondary | ICD-10-CM | POA: Diagnosis not present

## 2014-09-15 ENCOUNTER — Encounter (HOSPITAL_COMMUNITY)
Admission: RE | Admit: 2014-09-15 | Discharge: 2014-09-15 | Disposition: A | Discharge status: Home / Self Care | Payer: Medicare HMO | Source: Ambulatory Visit | Attending: Cardiology | Admitting: Cardiology

## 2014-09-15 DIAGNOSIS — Z951 Presence of aortocoronary bypass graft: Secondary | ICD-10-CM | POA: Diagnosis not present

## 2014-09-18 ENCOUNTER — Ambulatory Visit (INDEPENDENT_AMBULATORY_CARE_PROVIDER_SITE_OTHER): Payer: Medicare HMO | Admitting: Podiatry

## 2014-09-18 ENCOUNTER — Encounter (HOSPITAL_COMMUNITY)
Admission: RE | Admit: 2014-09-18 | Discharge: 2014-09-18 | Disposition: A | Discharge status: Home / Self Care | Payer: Medicare HMO | Source: Ambulatory Visit | Attending: Cardiology | Admitting: Cardiology

## 2014-09-18 ENCOUNTER — Encounter: Payer: Self-pay | Admitting: Podiatry

## 2014-09-18 VITALS — BP 150/89 | HR 52 | Temp 97.1°F | Resp 12

## 2014-09-18 DIAGNOSIS — G629 Polyneuropathy, unspecified: Secondary | ICD-10-CM

## 2014-09-18 DIAGNOSIS — L84 Corns and callosities: Secondary | ICD-10-CM

## 2014-09-18 DIAGNOSIS — Z951 Presence of aortocoronary bypass graft: Secondary | ICD-10-CM | POA: Diagnosis not present

## 2014-09-18 NOTE — Progress Notes (Signed)
   Subjective:    Patient ID: Trevor Aguilar, male    DOB: [birth year], 79 y.o.   MRN: [id number]  HPI N-SORE, THICK SKIN L-RT FOOT 5TH MET. AND LT FOOT 5TH MET. D- YEARS O-SLOWLY C-SAME T-PADDING (CORN REMOVAL PAD)  LT FOOT GREAT TOENAIL IS NOT GROWING   Review of Systems  HENT: Positive for hearing loss.   Eyes: Positive for redness and visual disturbance.  Musculoskeletal: Positive for back pain.  Skin: Positive for color change.  Hematological: Bruises/bleeds easily.  All other systems reviewed and are negative.      Objective:   Physical Exam  Orientated 3 patient presents with wife and treatment room  Vascular: DP left 0/4 and DP right 2/4 PT pulses 2/4 bilaterally Capillary reflex immediate bilaterally  Neurological: Sensation to 10 g monofilament wire intact 2/5 right and 4/5 left Vibratory sensation nonreactive bilaterally Reflex reactive bilaterally  Dermatological: Bleeding callus sub-fifth right MPJ that remains closed after debridement Plantar hallux sub-fifth left MPJ  Musculoskeletal: Pes planus HAV deformities bilaterally      Assessment & Plan:   Assessment: Peripheral neuropathy Pre-ulcerative plantar callus right Plantar callus left History of peripheral arterial disease  Plan: Debrided calluses 2 without a bleeding Attach felt pads to patient's existing shoe insoles to offload fifth MPJ right and left  Reappoint at patient's request

## 2014-09-18 NOTE — Patient Instructions (Signed)
Wear the insole with the felt pad attached to take pressure off the bottom of the right foot in the callused area  Reappoint when callus reoccurs and needs further trimming

## 2014-09-19 ENCOUNTER — Encounter (INDEPENDENT_AMBULATORY_CARE_PROVIDER_SITE_OTHER): Payer: Medicare HMO | Admitting: Ophthalmology

## 2014-09-19 DIAGNOSIS — H59033 Cystoid macular edema following cataract surgery, bilateral: Secondary | ICD-10-CM | POA: Diagnosis not present

## 2014-09-19 DIAGNOSIS — H43813 Vitreous degeneration, bilateral: Secondary | ICD-10-CM

## 2014-09-20 ENCOUNTER — Encounter (HOSPITAL_COMMUNITY)
Admission: RE | Admit: 2014-09-20 | Discharge: 2014-09-20 | Disposition: A | Discharge status: Home / Self Care | Payer: Medicare HMO | Source: Ambulatory Visit | Attending: Cardiology | Admitting: Cardiology

## 2014-09-20 DIAGNOSIS — Z951 Presence of aortocoronary bypass graft: Secondary | ICD-10-CM | POA: Diagnosis not present

## 2014-09-22 ENCOUNTER — Encounter (HOSPITAL_COMMUNITY)
Admission: RE | Admit: 2014-09-22 | Discharge: 2014-09-22 | Disposition: A | Discharge status: Home / Self Care | Payer: Medicare HMO | Source: Ambulatory Visit | Attending: Cardiology | Admitting: Cardiology

## 2014-09-22 DIAGNOSIS — Z951 Presence of aortocoronary bypass graft: Secondary | ICD-10-CM | POA: Diagnosis not present

## 2014-09-25 ENCOUNTER — Encounter (HOSPITAL_COMMUNITY)
Admission: RE | Admit: 2014-09-25 | Discharge: 2014-09-25 | Disposition: A | Discharge status: Home / Self Care | Payer: Medicare HMO | Source: Ambulatory Visit | Attending: Cardiology | Admitting: Cardiology

## 2014-09-25 DIAGNOSIS — Z951 Presence of aortocoronary bypass graft: Secondary | ICD-10-CM | POA: Diagnosis not present

## 2014-09-27 ENCOUNTER — Encounter (HOSPITAL_COMMUNITY)
Admission: RE | Admit: 2014-09-27 | Discharge: 2014-09-27 | Disposition: A | Discharge status: Home / Self Care | Payer: Medicare HMO | Source: Ambulatory Visit | Attending: Cardiology | Admitting: Cardiology

## 2014-09-27 DIAGNOSIS — Z951 Presence of aortocoronary bypass graft: Secondary | ICD-10-CM | POA: Diagnosis not present

## 2014-09-29 ENCOUNTER — Encounter (HOSPITAL_COMMUNITY): Payer: Medicare HMO

## 2014-10-02 ENCOUNTER — Encounter (HOSPITAL_COMMUNITY)
Admission: RE | Admit: 2014-10-02 | Discharge: 2014-10-02 | Disposition: A | Discharge status: Home / Self Care | Payer: Medicare HMO | Source: Ambulatory Visit | Attending: Cardiology | Admitting: Cardiology

## 2014-10-02 DIAGNOSIS — Z951 Presence of aortocoronary bypass graft: Secondary | ICD-10-CM | POA: Diagnosis not present

## 2014-10-04 ENCOUNTER — Encounter (HOSPITAL_COMMUNITY)
Admission: RE | Admit: 2014-10-04 | Discharge: 2014-10-04 | Disposition: A | Discharge status: Home / Self Care | Payer: Medicare HMO | Source: Ambulatory Visit | Attending: Cardiology | Admitting: Cardiology

## 2014-10-04 DIAGNOSIS — Z951 Presence of aortocoronary bypass graft: Secondary | ICD-10-CM | POA: Diagnosis not present

## 2014-10-06 ENCOUNTER — Encounter (HOSPITAL_COMMUNITY)
Admission: RE | Admit: 2014-10-06 | Discharge: 2014-10-06 | Disposition: A | Discharge status: Home / Self Care | Payer: Medicare HMO | Source: Ambulatory Visit | Attending: Cardiology | Admitting: Cardiology

## 2014-10-06 DIAGNOSIS — Z951 Presence of aortocoronary bypass graft: Secondary | ICD-10-CM | POA: Diagnosis not present

## 2014-10-09 ENCOUNTER — Encounter (HOSPITAL_COMMUNITY)
Admission: RE | Admit: 2014-10-09 | Discharge: 2014-10-09 | Disposition: A | Discharge status: Home / Self Care | Payer: Medicare HMO | Source: Ambulatory Visit | Attending: Cardiology | Admitting: Cardiology

## 2014-10-09 DIAGNOSIS — Z951 Presence of aortocoronary bypass graft: Secondary | ICD-10-CM | POA: Diagnosis not present

## 2014-10-11 ENCOUNTER — Encounter (HOSPITAL_COMMUNITY)
Admission: RE | Admit: 2014-10-11 | Discharge: 2014-10-11 | Disposition: A | Discharge status: Home / Self Care | Payer: Medicare HMO | Source: Ambulatory Visit | Attending: Cardiology | Admitting: Cardiology

## 2014-10-11 DIAGNOSIS — Z951 Presence of aortocoronary bypass graft: Secondary | ICD-10-CM | POA: Diagnosis not present

## 2014-10-13 ENCOUNTER — Encounter (HOSPITAL_COMMUNITY)
Admission: RE | Admit: 2014-10-13 | Discharge: 2014-10-13 | Disposition: A | Discharge status: Home / Self Care | Payer: Medicare HMO | Source: Ambulatory Visit | Attending: Cardiology | Admitting: Cardiology

## 2014-10-13 DIAGNOSIS — Z951 Presence of aortocoronary bypass graft: Secondary | ICD-10-CM | POA: Diagnosis not present

## 2014-10-16 ENCOUNTER — Encounter (HOSPITAL_COMMUNITY)
Admission: RE | Admit: 2014-10-16 | Discharge: 2014-10-16 | Disposition: A | Discharge status: Home / Self Care | Payer: Medicare HMO | Source: Ambulatory Visit | Attending: Cardiology | Admitting: Cardiology

## 2014-10-16 DIAGNOSIS — Z951 Presence of aortocoronary bypass graft: Secondary | ICD-10-CM | POA: Diagnosis not present

## 2014-10-18 ENCOUNTER — Encounter (HOSPITAL_COMMUNITY)
Admission: RE | Admit: 2014-10-18 | Discharge: 2014-10-18 | Disposition: A | Discharge status: Home / Self Care | Payer: Medicare HMO | Source: Ambulatory Visit | Attending: Cardiology | Admitting: Cardiology

## 2014-10-18 DIAGNOSIS — Z951 Presence of aortocoronary bypass graft: Secondary | ICD-10-CM | POA: Diagnosis not present

## 2014-10-20 ENCOUNTER — Encounter (HOSPITAL_COMMUNITY)
Admission: RE | Admit: 2014-10-20 | Discharge: 2014-10-20 | Disposition: A | Discharge status: Home / Self Care | Payer: Medicare HMO | Source: Ambulatory Visit | Attending: Cardiology | Admitting: Cardiology

## 2014-10-20 DIAGNOSIS — Z951 Presence of aortocoronary bypass graft: Secondary | ICD-10-CM | POA: Diagnosis not present

## 2014-10-23 ENCOUNTER — Encounter (HOSPITAL_COMMUNITY)
Admission: RE | Admit: 2014-10-23 | Discharge: 2014-10-23 | Disposition: A | Discharge status: Home / Self Care | Payer: Medicare HMO | Source: Ambulatory Visit | Attending: Cardiology | Admitting: Cardiology

## 2014-10-23 DIAGNOSIS — Z951 Presence of aortocoronary bypass graft: Secondary | ICD-10-CM | POA: Diagnosis not present

## 2014-10-25 ENCOUNTER — Encounter (HOSPITAL_COMMUNITY)
Admission: RE | Admit: 2014-10-25 | Discharge: 2014-10-25 | Disposition: A | Discharge status: Home / Self Care | Payer: Medicare HMO | Source: Ambulatory Visit | Attending: Cardiology | Admitting: Cardiology

## 2014-10-25 DIAGNOSIS — Z951 Presence of aortocoronary bypass graft: Secondary | ICD-10-CM | POA: Diagnosis not present

## 2014-10-25 NOTE — Progress Notes (Signed)
Pt graduates from cardiac rehab program on Friday with completion of 36 exercise sessions in Phase II. Pt maintained good attendance and progressed nicely during his participation in rehab as evidenced by increased MET level. . Repeat  PHQ score-  0.  Pt has made significant lifestyle changes and should be commended for his success. Pt feels he has achieved his goals during cardiac rehab.   Pt plans to continue exercise at home the patient has a treadmill and plans to walk.

## 2014-10-27 ENCOUNTER — Encounter (HOSPITAL_COMMUNITY)
Admission: RE | Admit: 2014-10-27 | Discharge: 2014-10-27 | Disposition: A | Discharge status: Home / Self Care | Payer: Medicare HMO | Source: Ambulatory Visit | Attending: Cardiology | Admitting: Cardiology

## 2014-10-27 DIAGNOSIS — Z951 Presence of aortocoronary bypass graft: Secondary | ICD-10-CM | POA: Diagnosis not present

## 2015-05-15 IMAGING — CR DG CHEST 1V PORT
1 series · 1 of 1 positions shown · non-contrast
Comparison: May 26, 2014

CLINICAL DATA: Postoperative coronary artery bypass grafting with
atelectatic change

EXAM:
PORTABLE CHEST - 1 VIEW

[AP]
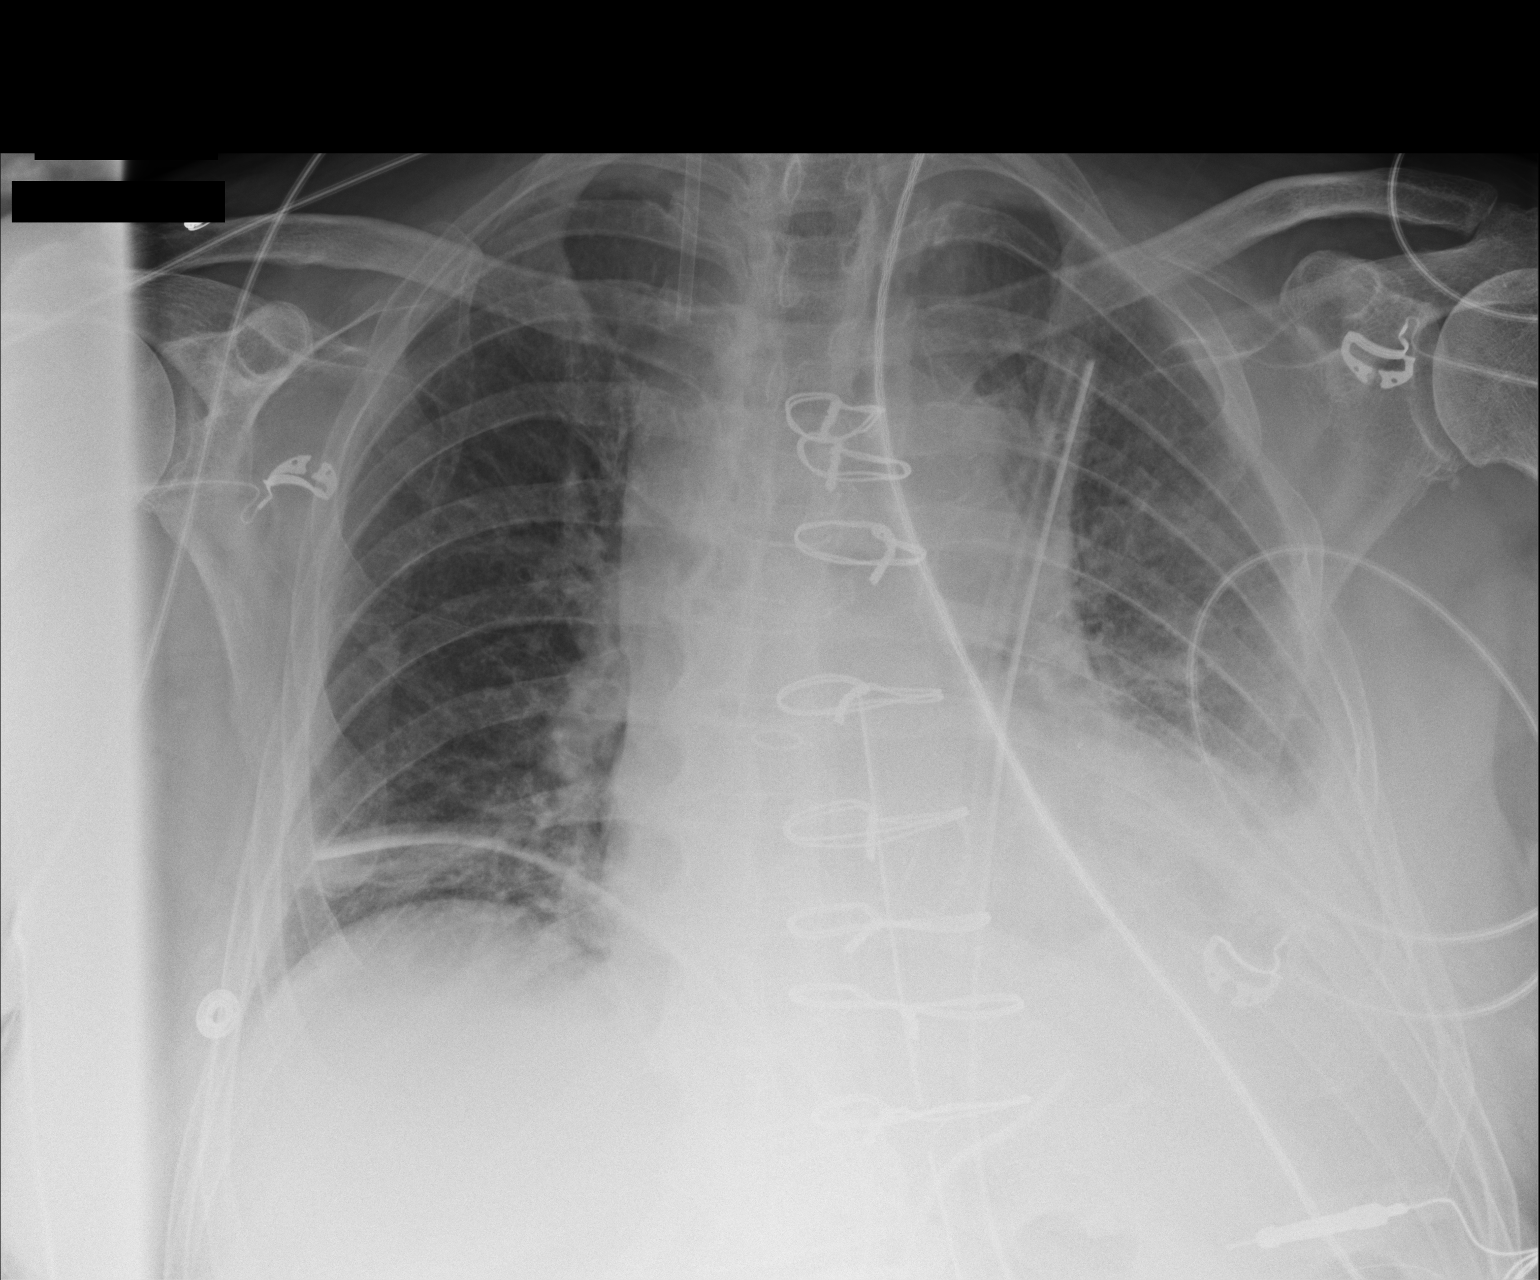

[1 of 1 positions shown; findings below may reference images not displayed]

FINDINGS: Swan-Ganz catheter has been removed. Cordis tip is in the superior
vena cava. There are bilateral chest tubes as well as a mediastinal
drain. No pneumothorax. There is patchy atelectatic change in the
left lower lobe with small left effusion, stable. Right lung is
clear. Heart is prominent with pulmonary vascularity within normal
limits, stable. No adenopathy.
IMPRESSION: Tube and catheter positions as described without pneumothorax.
Persistent left lower lobe atelectatic change with small left
effusion. No new opacity. No change in cardiac silhouette.

## 2015-05-16 IMAGING — CR DG CHEST 1V PORT
1 series · 1 of 1 positions shown · non-contrast
Comparison: 05/27/2014

CLINICAL DATA: Atelectasis

EXAM:
PORTABLE CHEST - 1 VIEW

[AP]
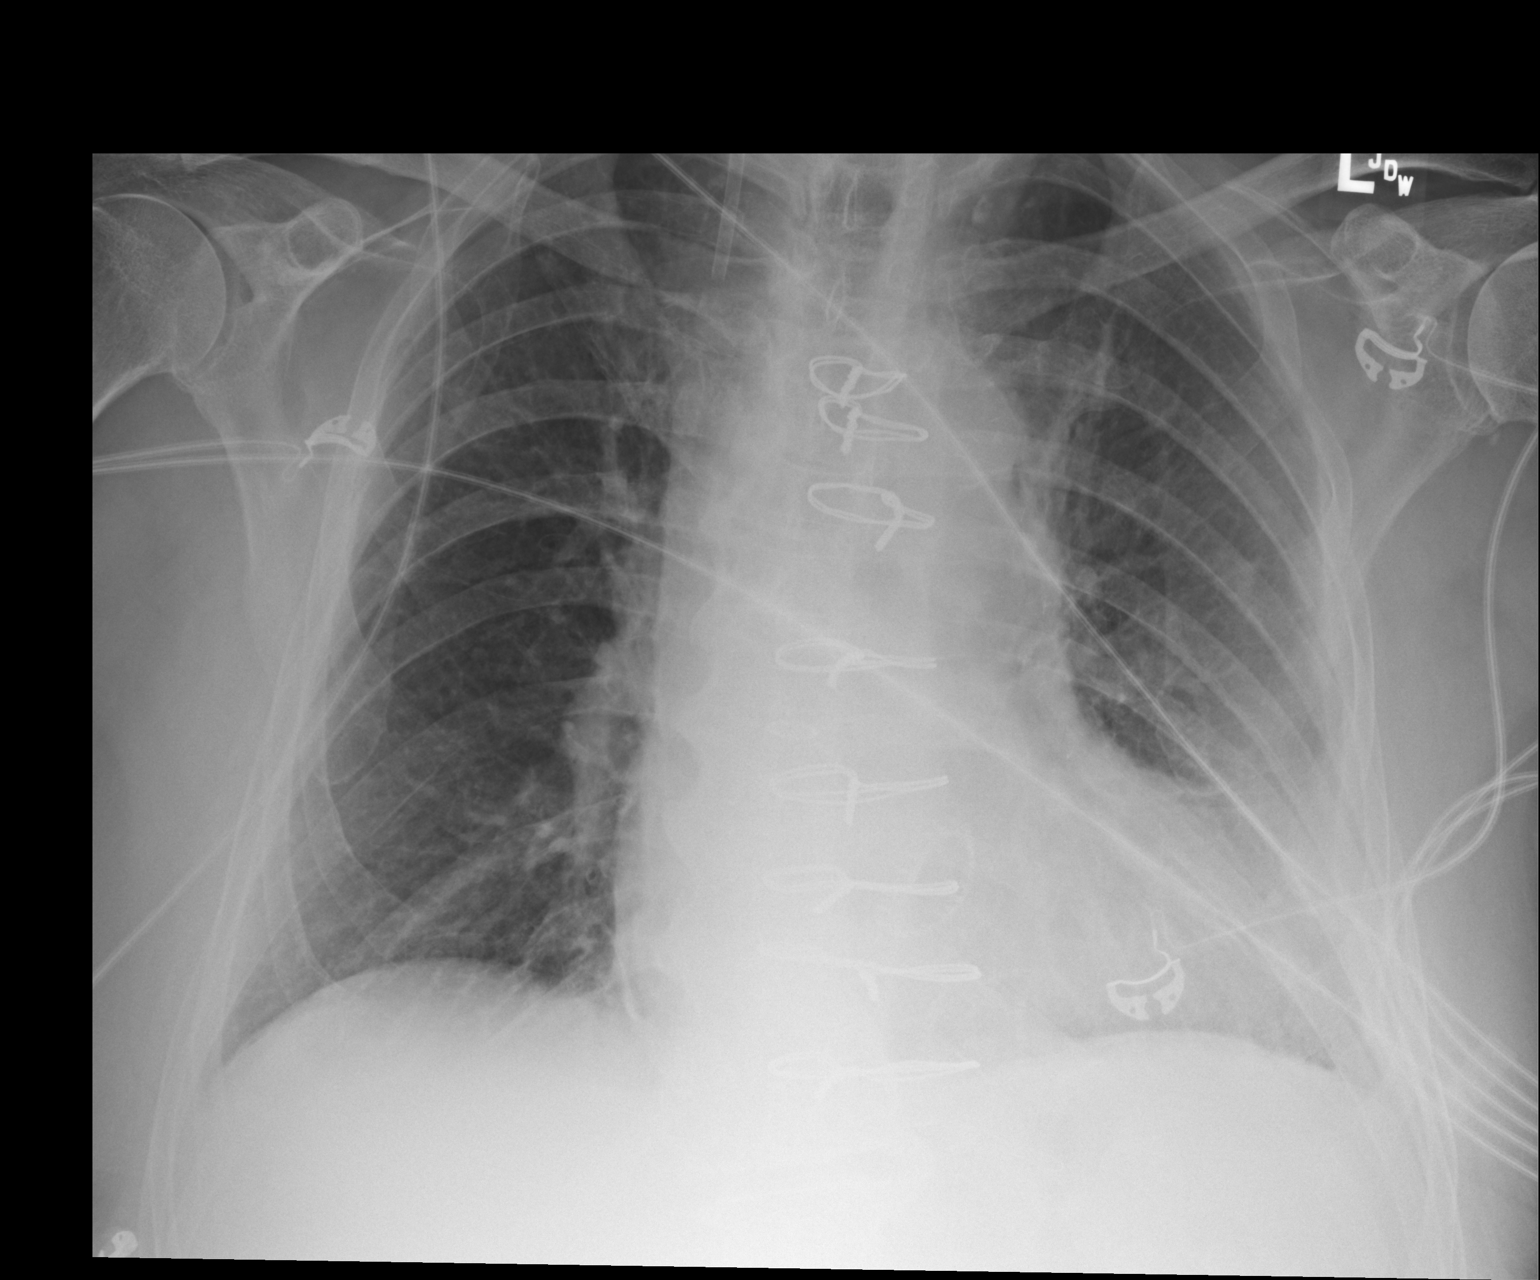

[1 of 1 positions shown; findings below may reference images not displayed]

FINDINGS: Right jugular sheath in satisfactory position. Interval removal of
the left-sided chest tube and mediastinal drain. Trace left pleural
effusion. Left basilar hazy airspace disease likely reflecting
atelectasis with interval improved aeration. Clear right lung.
Stable cardiomediastinal silhouette. Prior CABG.
IMPRESSION: Interval removal of the left-sided chest tube and mediastinal drain.
No left pneumothorax.

Left basilar atelectasis with improved aeration.

## 2015-05-18 IMAGING — CR DG CHEST 1V PORT
1 series · 1 of 1 positions shown · non-contrast
Comparison: May 29, 2014

CLINICAL DATA: Mitral regurgitation, status post mitral valve
replacement

EXAM:
PORTABLE CHEST - 1 VIEW

[portable]
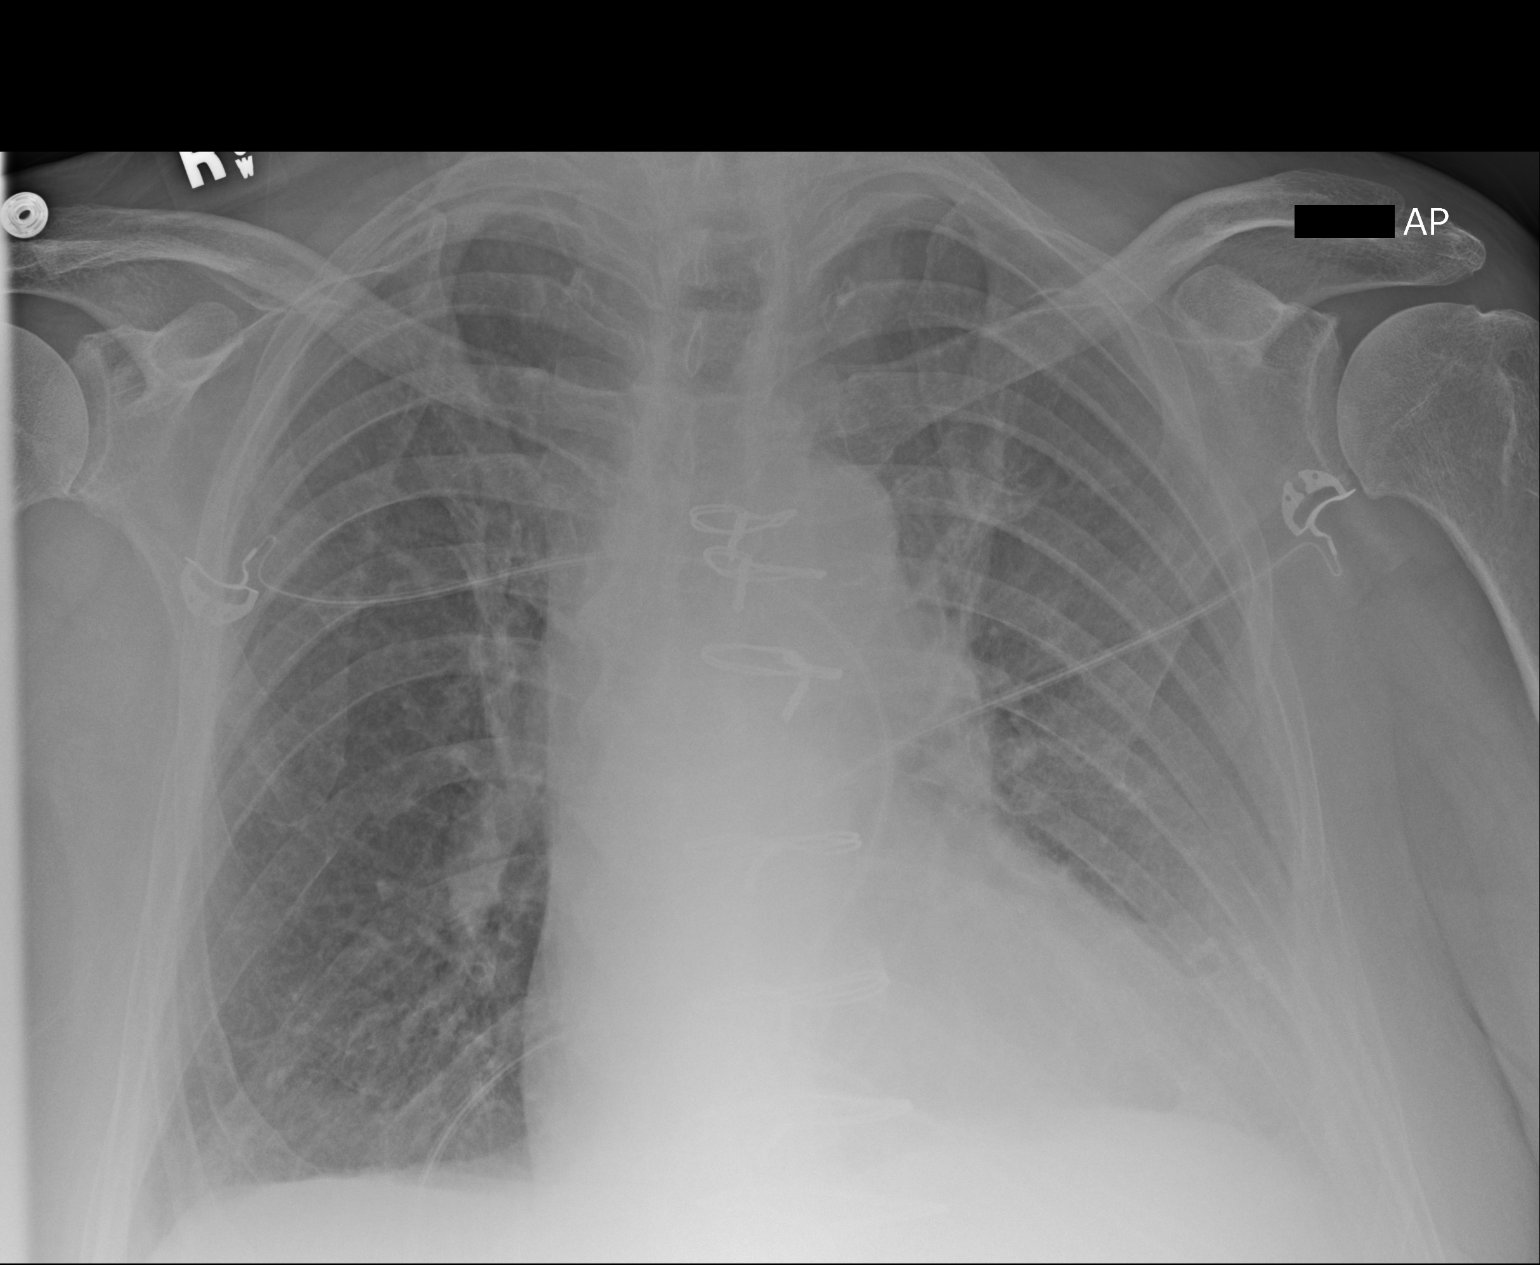

[1 of 1 positions shown; findings below may reference images not displayed]

FINDINGS: There is underlying emphysematous change. There is mild left base
atelectasis. There is no edema or consolidation. Heart is upper
normal in size with pulmonary vascularity within normal limits. No
adenopathy. No pneumothorax.
IMPRESSION: Underlying emphysema.  No edema or consolidation.  No pneumothorax.

## 2015-05-19 IMAGING — CR DG CHEST 2V
2 series · 2 of 2 positions shown · non-contrast
Comparison: May 30, 2014

CLINICAL DATA: Status post coronary artery bypass grafting and
mitral valve replacement

EXAM:
CHEST  2 VIEW

[chest pa]
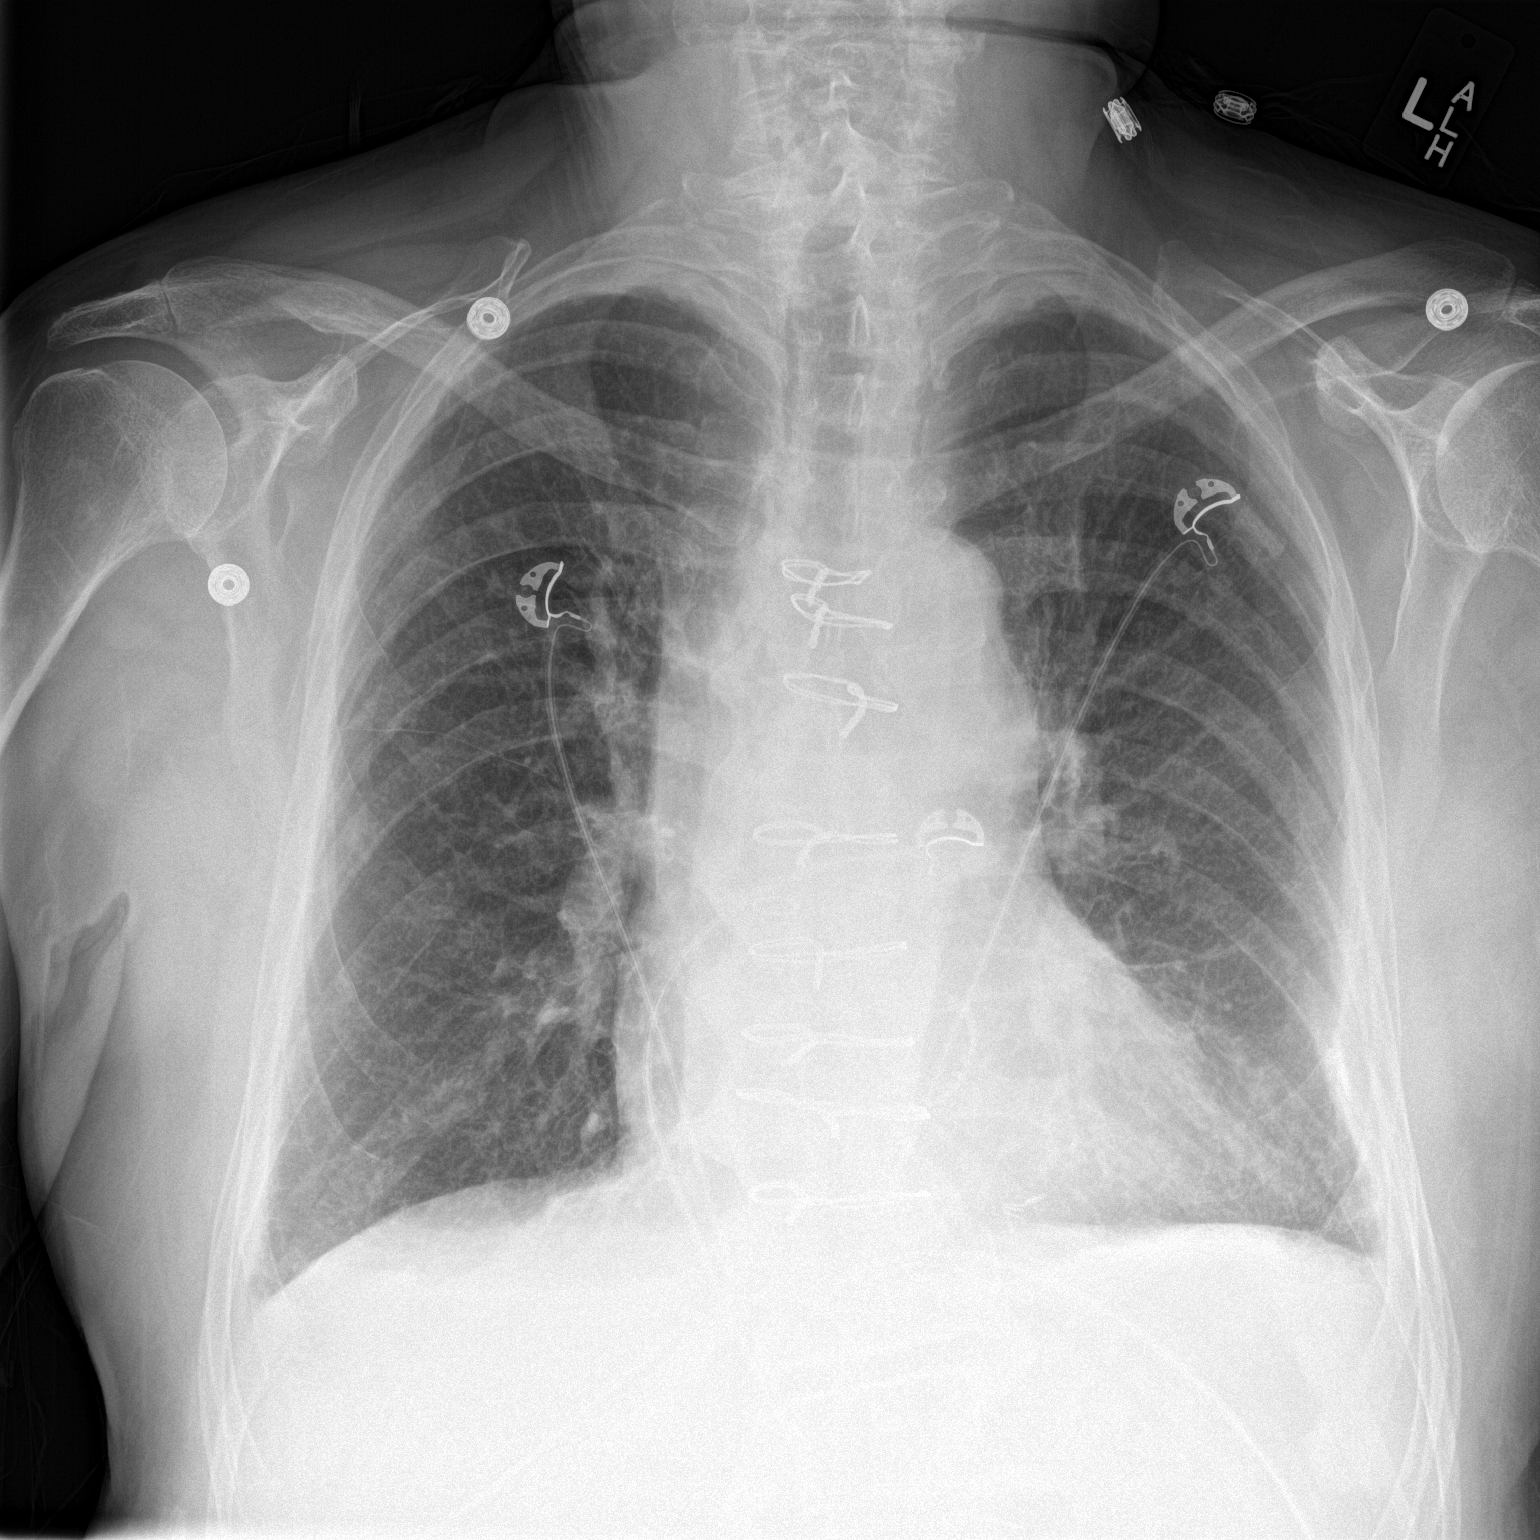

[chest lat]
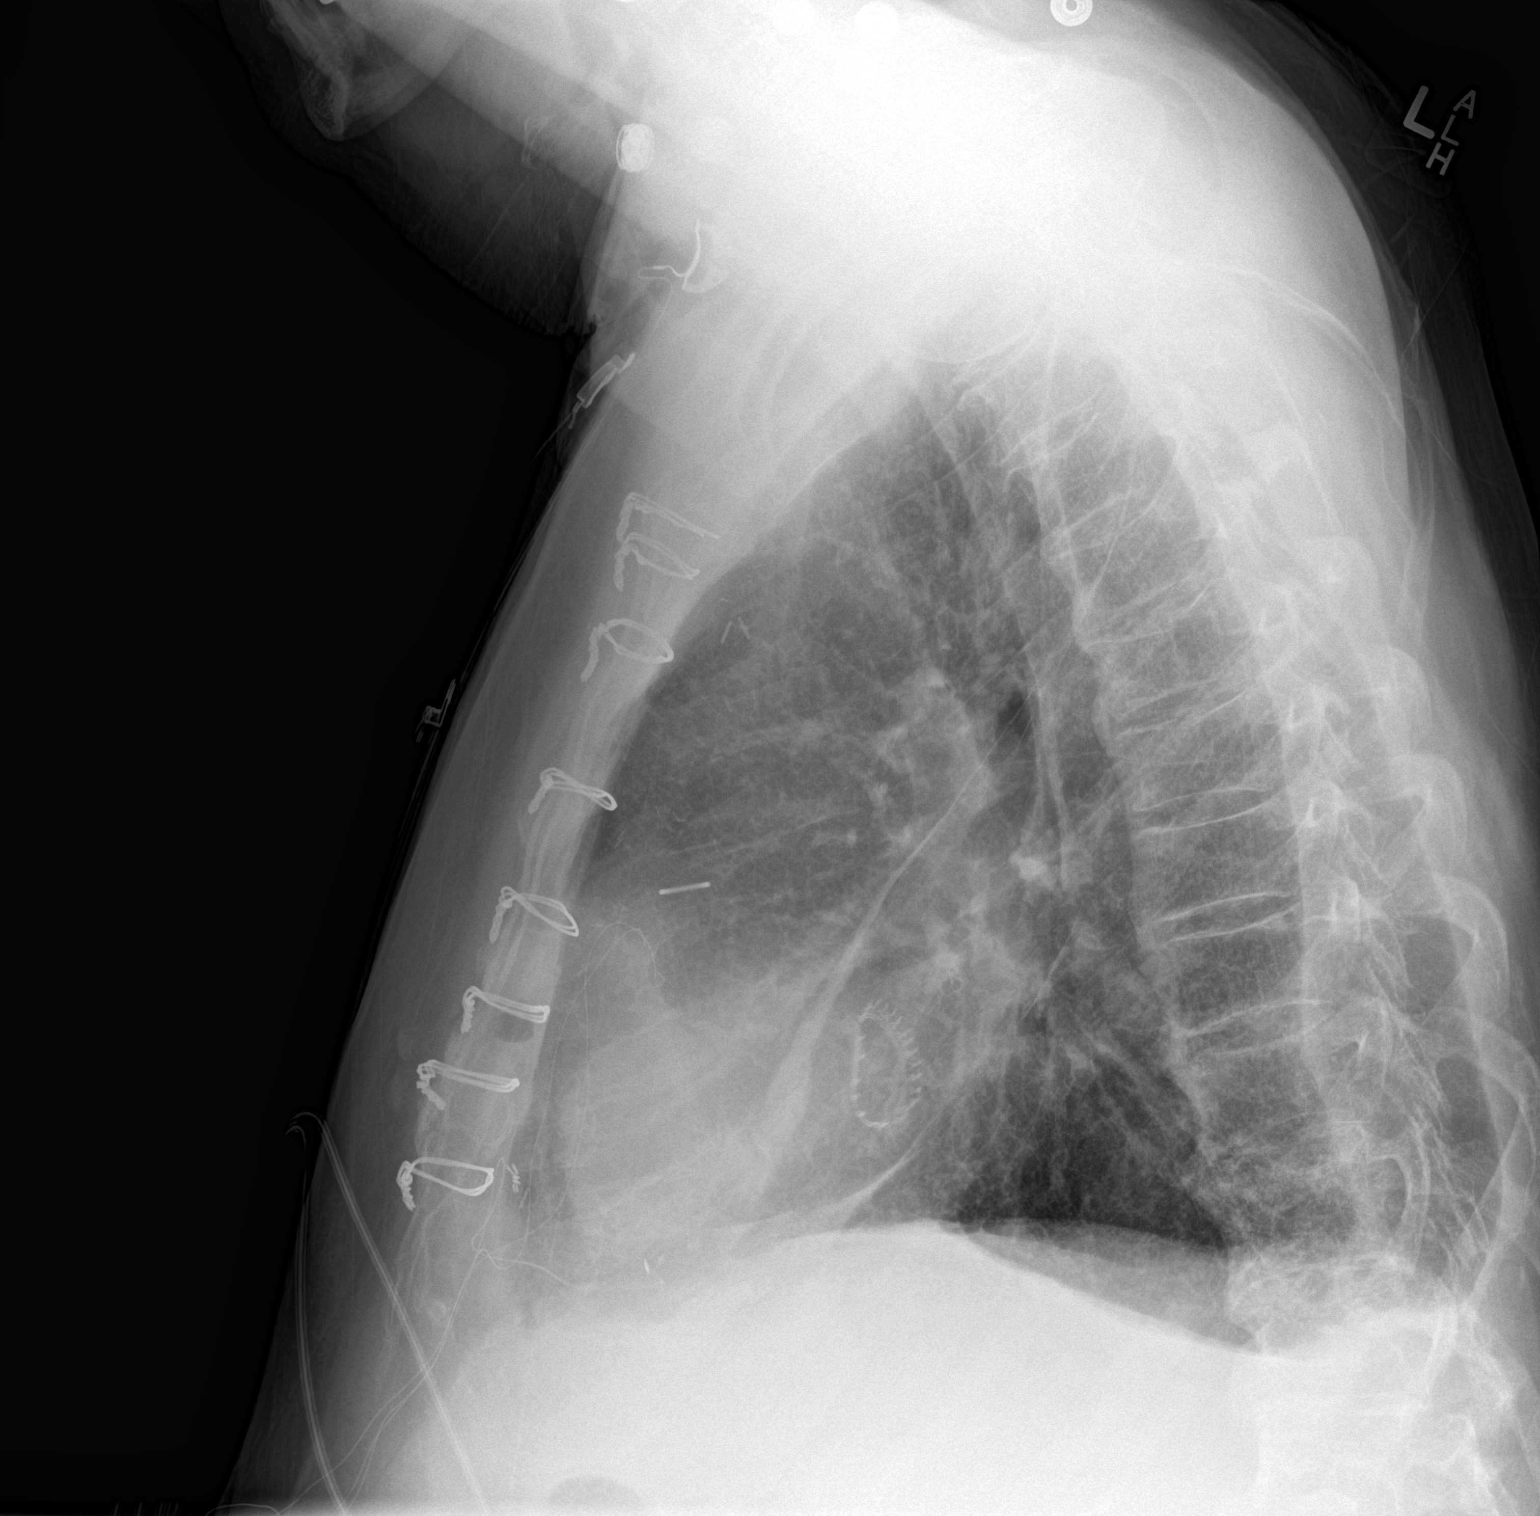

[2 of 2 positions shown; findings below may reference images not displayed]

FINDINGS: Temporary pacemaker wires remain attached to the right heart. A
small amount of air in the inferior anterior mediastinum is felt to
be of postoperative etiology.

There is underlying emphysema. There is mild bibasilar scarring.
There is no edema or consolidation. The heart size and pulmonary
vascularity are within normal limits. Patient is status post mitral
valve replacement and coronary artery bypass grafting. There is
degenerative change in the thoracic spine. No adenopathy. There are
foci of carotid artery calcification bilaterally.
IMPRESSION: No pneumothorax. A small amount of pneumomediastinum inferiorly is
felt to be of postoperative etiology. Underlying emphysema with mild
scarring in the bases present. No edema or consolidation. Foci of
carotid artery calcification present.

## 2015-05-28 ENCOUNTER — Encounter: Payer: Self-pay | Admitting: Thoracic Surgery (Cardiothoracic Vascular Surgery)

## 2015-05-28 ENCOUNTER — Ambulatory Visit (INDEPENDENT_AMBULATORY_CARE_PROVIDER_SITE_OTHER): Payer: Medicare HMO | Admitting: Thoracic Surgery (Cardiothoracic Vascular Surgery)

## 2015-05-28 VITALS — BP 132/58 | HR 68 | Resp 20 | Ht 73.0 in | Wt 215.0 lb

## 2015-05-28 DIAGNOSIS — Z9889 Other specified postprocedural states: Secondary | ICD-10-CM | POA: Diagnosis not present

## 2015-05-28 DIAGNOSIS — Q2112 Patent foramen ovale: Secondary | ICD-10-CM

## 2015-05-28 DIAGNOSIS — Z951 Presence of aortocoronary bypass graft: Secondary | ICD-10-CM

## 2015-05-28 DIAGNOSIS — Q211 Atrial septal defect: Secondary | ICD-10-CM | POA: Diagnosis not present

## 2015-05-28 NOTE — Progress Notes (Signed)
[address]       [name] [address]             [phone]     CARDIOTHORACIC SURGERY OFFICE NOTE  Referring Provider is Yates Decamp, MD PCP is Elizabeth Palau, FNP   HPI:  Patient returns to the office today for routine follow-up proximally one year status post mitral valve repair, coronary artery bypass grafting 2, and closure of patent foramen ovale on 05/25/2014 for stage D severe symptomatic primary mitral regurgitation with mild left ventricular systolic dysfunction, moderate to severe pulmonary hypertension, and two-vessel coronary artery disease. His postoperative recovery was notable for the development of atrial fibrillation but he converted back to sinus rhythm spontaneously. He was last seen here in our office on 08/21/2014 at which time he was doing well. Since then he has continued to follow-up carefully with Dr. Jacinto Halim who last saw him on 04/19/2015.   Current Outpatient Prescriptions  Medication Sig Dispense Refill  . aspirin 81 MG tablet Take 81 mg by mouth daily.    Marland Kitchen atropine 1 % ophthalmic solution Place 1 drop into the left eye daily.    . Brinzolamide-Brimonidine (SIMBRINZA) 1-0.2 % SUSP Place 1 drop into the right eye 3 (three) times daily.     . Difluprednate (DUREZOL) 0.05 % EMUL Place 1 drop into the left eye 3 (three) times daily.    . fish oil-omega-3 fatty acids 1000 MG capsule Take 1 g by mouth daily.    . metoprolol succinate (TOPROL-XL) 25 MG 24 hr tablet Take 25 mg by mouth daily.    . simvastatin (ZOCOR) 20 MG tablet Take 20 mg by mouth daily.     Marland Kitchen warfarin (COUMADIN) 3 MG tablet Take 3 mg by mouth daily.  0  . ZIOPTAN 0.0015 % SOLN   0   No current facility-administered medications for this visit.      Physical Exam:   BP 132/58 mmHg  Pulse 68  Resp 20  Ht  (1.854 m)  Wt 215 lb (97.523 kg)  BMI 28.37 kg/m2  SpO2 97%  General:  Well-appearing  Chest:   Clear to auscultation  CV:   Regular rate and rhythm  without murmur  Incisions:  Completely healed, sternum is stable  Abdomen:  Soft and nontender  Extremities:  Warm and well-perfused  Diagnostic Tests:  Report of transthoracic echocardiogram performed 06/21/2014 is reviewed. By report the patient had mild left ventricular dysfunction with ejection fraction estimated 54%. The mitral valve repair appeared intact with no residual mitral regurgitation. No other significant abnormalities of the noted.   Most recent electrocardiogram performed 04/19/2015 reportedly demonstrated sinus rhythm with first-degree AV block.   Impression:  Patient is doing very well approximately one year status post mitral valve repair, coronary artery bypass grafting 2, and closure of patent foramen ovale. He describes stable symptoms of exertional shortness of breath that occur only with relatively strenuous activity, consistent with functional class II symptoms of chronic diastolic congestive heart failure. Follow-up echocardiogram performed last January revealed intact mitral valve repair with mild left ventricular systolic dysfunction.  Plan:  The patient will continue to follow-up regular with Dr. Jacinto Halim.  The patient has been reminded regarding the importance of dental hygiene and the lifelong need for antibiotic prophylaxis for all dental cleanings and other related invasive procedures.  In the future he will call and return to see [place] only as needed.  I spent in excess of 15 minutes  during the conduct of this office consultation and >50% of this time involved direct face-to-face encounter with the patient for counseling and/or coordination of their care.   Salvatore Decentlarence H. Cornelius Moraswen, MD 05/28/2015 11:26 AM

## 2015-05-28 NOTE — Patient Instructions (Signed)

## 2018-08-03 ENCOUNTER — Ambulatory Visit (INDEPENDENT_AMBULATORY_CARE_PROVIDER_SITE_OTHER): Payer: Medicare HMO

## 2018-08-03 DIAGNOSIS — Z7901 Long term (current) use of anticoagulants: Secondary | ICD-10-CM

## 2018-08-03 DIAGNOSIS — I4891 Unspecified atrial fibrillation: Secondary | ICD-10-CM

## 2018-08-03 LAB — POCT INR: INR: 2 (ref 2.0–3.0)

## 2018-08-03 LAB — PROTIME-INR: INR: 2.1 — AB (ref 0.9–1.1)

## 2018-08-11 ENCOUNTER — Ambulatory Visit: Payer: Self-pay | Admitting: Cardiology

## 2018-08-11 ENCOUNTER — Encounter: Payer: Self-pay | Admitting: Cardiology

## 2018-08-11 NOTE — Progress Notes (Deleted)
Subjective:  Primary Physician:  Vicenta Aly, FNP  Patient ID: Trevor Aguilar, male    DOB: Apr 08, 1936, 83 y.o.   MRN: [id number]  No chief complaint on file.   HPI: Trevor Aguilar  is a 83 y.o. male  with CABG and mitral repair on 05/25/2014 with placement of a 28 mm angioplasty drink along with LIMA to LAD and SVG to RCA by Dr. Darylene Price.  He denies any worsening shortness of breath, PND or orthopnea. No chest pain or palpitations. Tolerating Coumadin without bleeding diathesis. He had postoperative A fib; however, has been maintaing sinus rhythm. Has chronic frequent PVCs and bradycardia that is asymptomatic and has been stable. He does continue to have leg swelling that has been stable.  Past Medical History:  Diagnosis Date  . Acute on chronic diastolic congestive heart failure ([place]) 05/18/2014  . Aortic valve sclerosis 05/11/2014   Aortic valve sclerosis w/out stenosis  . Bilateral cataracts   . Chronic diastolic congestive heart failure ([place]) 05/18/2014  . Coronary artery disease 05/12/2014   High grade proximal LAD stenosis and moderate RCA stenosis  . Glaucoma   . Hearing loss    bilateral hearing aids  . Heart murmur   . High cholesterol   . History of skull fracture 1978   lower skull  . Hypercholesteremia   . Hypertension   . Mitral regurgitation 05/12/2014   Mitral valve prolapse with flail segment of posterior leaflet and severe mitral regurgitation   . Mitral regurgitation 05/10/2014   Mitral valve prolapse with flail segment of posterior leaflet and severe mitral regurgitation   . Nonspecific abnormal electrocardiogram (ECG) (EKG)    Pt states he had a cardiac workup and passed all tests (Echo and Stress)  . Pneumonia 1967  . Pulmonary hypertension ([place]) 05/12/2014  . Rash, skin Dec 2015   near buttocks; PCP aware   . 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 S/P mitral valve repair 05/25/2014   Complex  valvuloplasty including triangular resection of flail segment of posterior leaflet, artificial Goretex neochord placement x2 and 28 mm Sorin Memo 3D Rechord ring annuloplasty   . Shortness of breath dyspnea    with ambulation  . Tricuspid regurgitation 05/02/2014   Moderate TR by transthoracic ECHO  . Umbilical hernia     Past Surgical History:  Procedure Laterality Date  . CARDIAC CATHETERIZATION    . CATARACT EXTRACTION W/PHACO Left 03/01/2014   Procedure: CATARACT EXTRACTION PHACO AND INTRAOCULAR LENS PLACEMENT (IOC);  Surgeon: Marylynn Pearson, MD;  Location: [facility];  Service: Ophthalmology;  Laterality: Left;  . COLONOSCOPY    . CORONARY ARTERY BYPASS GRAFT N/A 05/25/2014   Procedure: CORONARY ARTERY BYPASS GRAFTING (CABG)TIMES 2 USING LEFT INTERNAL MAMMARY AND LEFT SAPHENOUS VEIN HARVESTED ENDOSCOPICALLY;  Surgeon: Rexene Alberts, MD;  Location: [facility];  Service: Open Heart Surgery;  Laterality: N/A;  . EYE SURGERY Right 05/14/12   cataract removal with lens implant  . FACIAL FRACTURE SURGERY  1970's  . LEFT AND RIGHT HEART CATHETERIZATION WITH CORONARY ANGIOGRAM N/A 05/12/2014   Procedure: LEFT AND RIGHT HEART CATHETERIZATION WITH CORONARY ANGIOGRAM;  Surgeon: Laverda Page, MD;  Location: [facility] CATH LAB;  Service: Cardiovascular;  Laterality: N/A;  . MITRAL VALVE REPAIR N/A 05/25/2014   Procedure: MITRAL VALVE REPAIR (MVR) WITH CLOSURE OF PATENT FORAMEN OVALE;  Surgeon: Rexene Alberts, MD;  Location: [facility];  Service: Open Heart  Surgery;  Laterality: N/A;  . TEE WITHOUT CARDIOVERSION N/A 05/11/2014   Procedure: TRANSESOPHAGEAL ECHOCARDIOGRAM (TEE);  Surgeon: Laverda Page, MD;  Location: [facility];  Service: Cardiovascular;  Laterality: N/A;  . TEE WITHOUT CARDIOVERSION N/A 05/25/2014   Procedure: TRANSESOPHAGEAL ECHOCARDIOGRAM (TEE);  Surgeon: Rexene Alberts, MD;  Location: [facility];  Service: Open Heart Surgery;  Laterality: N/A;  . TONSILLECTOMY    . TRABECULECTOMY Left 03/01/2014    Procedure: TRABECULECTOMY;  Surgeon: Marylynn Pearson, MD;  Location: [facility];  Service: Ophthalmology;  Laterality: Left;    Social History   Socioeconomic History  . Marital status: Married    Spouse name: Not on file  . Number of children: Not on file  . Years of education: Not on file  . Highest education level: Not on file  Occupational History  . Not on file  Social Needs  . Financial resource strain: Not on file  . Food insecurity:    Worry: Not on file    Inability: Not on file  . Transportation needs:    Medical: Not on file    Non-medical: Not on file  Tobacco Use  . Smoking status: Former Smoker    Last attempt to quit: 03/01/1983    Years since quitting: 35.4  . Smokeless tobacco: Never Used  Substance and Sexual Activity  . Alcohol use: Yes    Alcohol/week: 21.0 standard drinks    Types: 21 Standard drinks or equivalent per week    Comment: 2 - 4 glasses of scotch daily  . Drug use: No  . Sexual activity: Not on file  Lifestyle  . Physical activity:    Days per week: Not on file    Minutes per session: Not on file  . Stress: Not on file  Relationships  . Social connections:    Talks on phone: Not on file    Gets together: Not on file    Attends religious service: Not on file    Active member of club or organization: Not on file    Attends meetings of clubs or organizations: Not on file    Relationship status: Not on file  . Intimate partner violence:    Fear of current or ex partner: Not on file    Emotionally abused: Not on file    Physically abused: Not on file    Forced sexual activity: Not on file  Other Topics Concern  . Not on file  Social History Narrative  . Not on file    Current Outpatient Medications on File Prior to Visit  Medication Sig Dispense Refill  . aspirin 81 MG tablet Take 81 mg by mouth daily.    Marland Kitchen atropine 1 % ophthalmic solution Place 1 drop into the left eye daily.    . Brinzolamide-Brimonidine (SIMBRINZA) 1-0.2 % SUSP  Place 1 drop into the right eye 3 (three) times daily.     . Difluprednate (DUREZOL) 0.05 % EMUL Place 1 drop into the left eye 3 (three) times daily.    . fish oil-omega-3 fatty acids 1000 MG capsule Take 1 g by mouth daily.    . metoprolol succinate (TOPROL-XL) 25 MG 24 hr tablet Take 25 mg by mouth daily.    . simvastatin (ZOCOR) 20 MG tablet Take 20 mg by mouth daily.     Marland Kitchen warfarin (COUMADIN) 3 MG tablet Take 3 mg by mouth daily.  0  . ZIOPTAN 0.0015 % SOLN   0   No current facility-administered medications  on file prior to visit.      Review of Systems  Constitutional: Negative for malaise/fatigue and weight loss.  Respiratory: Negative for cough, hemoptysis and shortness of breath.   Cardiovascular: Positive for leg swelling. Negative for chest pain, palpitations and claudication.  Gastrointestinal: Negative for abdominal pain, blood in stool, constipation, heartburn and vomiting.  Genitourinary: Negative for dysuria.  Musculoskeletal: Negative for joint pain and myalgias.  Neurological: Negative for dizziness, focal weakness and headaches.  Endo/Heme/Allergies: Does not bruise/bleed easily.  Psychiatric/Behavioral: Negative for depression. The patient is not nervous/anxious.   All other systems reviewed and are negative.      Objective:  There were no vitals taken for this visit. There is no height or weight on file to calculate BMI.  Physical Exam  Constitutional: He appears well-developed and well-nourished. No distress.  HENT:  Head: Atraumatic.  Eyes: Conjunctivae are normal.  Neck: Neck supple. No JVD present. No thyromegaly present.  Cardiovascular: Normal rate, intact distal pulses and normal pulses. An irregularly irregular rhythm present. Frequent extrasystoles are present. Exam reveals no gallop, no S3 and no S4.  No murmur heard. S1 variable, S2 normal.   Pulmonary/Chest: Effort normal and breath sounds normal.  Abdominal: Soft. Bowel sounds are normal.    Musculoskeletal: Normal range of motion.        General: Edema (2 plus bilateral pitting edema) present.  Neurological: He is alert.  Skin: Skin is warm and dry.  Psychiatric: He has a normal mood and affect.    CARDIAC STUDIES:   Coronary Angiogram [05/12/2014]:  Mild disease in the mid RCA, proximal 80-85% calcific LAD stenosis and moderate pulmonary hypertension.  Echocardiogram [05/22/2016]: 1. Left ventricle cavity is normal in size. Mild decrease in global wall motion. Visual EF is 45-50%. Diastolic function can't be assessed properly due to mitral annulus calcification, valve repair ring and mitral stenosis Calculated EF 51%. 2. Left atrial cavity is mildly dilated. 3. Trace aortic regurgitation. Mild aortic valve leaflet calcification. Mildly restricted aortic valve leaflets. No evidence of aortic valve stenosis. 4. Trace mitral regurgitation. Moderate calcification of the mitral valve annulus. Echoes of mitral valve repair ring are seen. Mild mitral valve stenosis, mean mitral gradient 3 mm of Hg, calculated valve area 1.4 cm2. 5. Mild to moderate tricuspid regurgitation. Mild to moderate pulmonary hypertension with approx. PA syst. pressure of 42 mm of Hg. 6. No diagnostic change c.f. echo. of 06/21/2014.  Carotid Doppler [05/16/2014]: Minimal disease of the right carotid artery. There is minimal evidence of heterogeneous plaque in the right carotid artery. No evidence of hemodynamically significant stenosis in the left carotid bifurcation vessels. There is evidence of homogeneous plaque in the left carotid artery. Assessment & Recommendations:   1. Paroxysmal atrial fibrillation (HCC) CHA2DS2-VASc Score is 4.  -(CHF; HTN; vasc disease DM,  Male = 1; Age <65 =0; 65-74 = 1,  >75 =2; stroke = 2).  -(Yearly risk of stroke: Score of 1=1.3; 2=2.2; 3=3.2; 4=4; 5=6.7; 6=9.8; 7=>9.8)  2. Chronic diastolic congestive heart failure ([place])  3. Coronary artery disease involving native  coronary artery of native heart without angina pectoris  CABG and mitral valve repair on 05/25/2014 with triangular resection of posterior leaflet, artificial Gore-Tex neochord placement 2, Sorin Memo 3D 28 mm annuloplasty ring placement, LIMA to LAD and SVG to RCA by Dr. Lilly Cove. Coronary angiogram 05/13/2014: Mild disease in the mid RCA, proximal 80-85% calcific LAD stenosis and moderate pulmonary hypertension.  4. S/P mitral valve repair +  CABG x2  5. CKD (chronic kidney disease) stage 3, GFR 30-59 ml/min (HCC)  6. 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  7. Laboratory examination Labs 08/24/2017: HB 14.1/HCT 41.4, platelets 282.  BUN 16, creatinine 1.18, eGFR 58 mL, CMP otherwise normal.  Lipids not performed past 2 years.  Vitamin D 31.9.  BNP 679 on 04/17/2016.  Recommendation: ***  Adrian Prows, MD, [facility] 08/11/2018, 5:23 AM [facility]. [place] Pager: [phone] Office: [phone] If no answer Cell [phone]

## 2018-08-17 ENCOUNTER — Other Ambulatory Visit: Payer: Self-pay | Admitting: Cardiology

## 2018-09-02 ENCOUNTER — Ambulatory Visit: Payer: Medicare HMO | Admitting: Cardiology

## 2018-09-14 ENCOUNTER — Ambulatory Visit (INDEPENDENT_AMBULATORY_CARE_PROVIDER_SITE_OTHER): Payer: Medicare HMO | Admitting: Cardiology

## 2018-09-14 DIAGNOSIS — I480 Paroxysmal atrial fibrillation: Secondary | ICD-10-CM

## 2018-09-14 LAB — POCT INR
INR: 1.8 — AB (ref 2.0–3.0)
INR: 1.8 — AB (ref 2.0–3.0)

## 2018-10-05 ENCOUNTER — Ambulatory Visit (INDEPENDENT_AMBULATORY_CARE_PROVIDER_SITE_OTHER): Payer: Medicare HMO | Admitting: Cardiology

## 2018-10-05 ENCOUNTER — Other Ambulatory Visit: Payer: Self-pay

## 2018-10-05 DIAGNOSIS — I480 Paroxysmal atrial fibrillation: Secondary | ICD-10-CM

## 2018-10-05 DIAGNOSIS — Z7901 Long term (current) use of anticoagulants: Secondary | ICD-10-CM

## 2018-10-05 DIAGNOSIS — Z5181 Encounter for therapeutic drug level monitoring: Secondary | ICD-10-CM

## 2018-10-05 LAB — POCT INR
INR: 2.1 (ref 2.0–3.0)
INR: 2.1 (ref 2.0–3.0)

## 2018-10-05 NOTE — Progress Notes (Signed)
Today Apr 12 - Oct 23 2018 Sun Mon Tue Wed Thu Fri Sat  12 3 mg 13 3 mg 14 4 mg 15 3 mg 16 4 mg 17 3 mg 18 7 mg  19 3 mg 20 3 mg 21 4 mg 22 3 mg 23 4 mg 24 3 mg 25 7 mg  26 3 mg 27 3 mg INR 2.1       Continuing present dose, we will recheck in 10 days in view of patient having changed the dose himself.  He'll also need office visit at the same time.

## 2018-10-06 NOTE — Progress Notes (Deleted)
Primary Physician/Referring:  Elizabeth Palau, FNP  Patient ID: Trevor Aguilar, male    DOB: [birth year], 83 y.o.   MRN: [id number]  No chief complaint on file.   HPI: Trevor Aguilar  is a 83 y.o. male  with history of CABG and mitral repair on 05/25/2014 with placement of a 28 mm angioplasty drink along with LIMA to LAD and SVG to RCA by Dr. Tressie Stalker, paroxysmal atrial fibrillation developed post-op, hyperlipidemia, hypertension, and CKD stage 3.  Patient was last seen 6 months ago, he denies any worsening shortness of breath, PND or orthopnea. No chest pain or palpitations.  Continues to be followed in our Coumadin clinic and is without bleeding diathesis. He has been maintaining sinus rhythm. Has chronic frequent PVCs and bradycardia that is asymptomatic and has been stable. He does continue to have leg swelling that has been stable. When he was last seen, he complained of a rash that is slowly improving and is being closely followed by Dermatology.   Past Medical History:  Diagnosis Date  . Acute on chronic diastolic congestive heart failure (HCC) 05/18/2014  . Aortic valve sclerosis 05/11/2014   Aortic valve sclerosis w/out stenosis  . Bilateral cataracts   . Chronic diastolic congestive heart failure (HCC) 05/18/2014  . Coronary artery disease 05/12/2014   High grade proximal LAD stenosis and moderate RCA stenosis  . Glaucoma   . Hearing loss    bilateral hearing aids  . Heart murmur   . High cholesterol   . History of skull fracture 1978   lower skull  . Hypercholesteremia   . Hypertension   . Mitral regurgitation 05/12/2014   Mitral valve prolapse with flail segment of posterior leaflet and severe mitral regurgitation   . Mitral regurgitation 05/10/2014   Mitral valve prolapse with flail segment of posterior leaflet and severe mitral regurgitation   . Nonspecific abnormal electrocardiogram (ECG) (EKG)    Pt states he had a cardiac workup and passed all tests (Echo  and Stress)  . Pneumonia 1967  . Pulmonary hypertension (HCC) 05/12/2014  . Rash, skin Dec 2015   near buttocks; PCP aware   . 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 Shortness of breath dyspnea    with ambulation  . Tricuspid regurgitation 05/02/2014   Moderate TR by transthoracic ECHO  . Umbilical hernia     Past Surgical History:  Procedure Laterality Date  . CARDIAC CATHETERIZATION    . CATARACT EXTRACTION W/PHACO Left 03/01/2014   Procedure: CATARACT EXTRACTION PHACO AND INTRAOCULAR LENS PLACEMENT (IOC);  Surgeon: Chalmers Guest, MD;  Location: [facility] OR;  Service: Ophthalmology;  Laterality: Left;  . COLONOSCOPY    . CORONARY ARTERY BYPASS GRAFT N/A 05/25/2014   Procedure: CORONARY ARTERY BYPASS GRAFTING (CABG)TIMES 2 USING LEFT INTERNAL MAMMARY AND LEFT SAPHENOUS VEIN HARVESTED ENDOSCOPICALLY;  Surgeon: Purcell Nails, MD;  Location: [facility] OR;  Service: Open Heart Surgery;  Laterality: N/A;  . EYE SURGERY Right 05/14/12   cataract removal with lens implant  . FACIAL FRACTURE SURGERY  1970's  . LEFT AND RIGHT HEART CATHETERIZATION WITH CORONARY ANGIOGRAM N/A 05/12/2014   Procedure: LEFT AND RIGHT HEART CATHETERIZATION WITH CORONARY ANGIOGRAM;  Surgeon: Pamella Pert, MD;  Location:  [facility] CATH LAB;  Service: Cardiovascular;  Laterality: N/A;  . MITRAL VALVE REPAIR N/A 05/25/2014   Procedure: MITRAL VALVE REPAIR (MVR) WITH CLOSURE OF PATENT FORAMEN OVALE;  Surgeon: Purcell Nails, MD;  Location: [facility] OR;  Service: Open Heart Surgery;  Laterality: N/A;  . TEE WITHOUT CARDIOVERSION N/A 05/11/2014   Procedure: TRANSESOPHAGEAL ECHOCARDIOGRAM (TEE);  Surgeon: Pamella Pert, MD;  Location: [facility] ENDOSCOPY;  Service: Cardiovascular;   Laterality: N/A;  . TEE WITHOUT CARDIOVERSION N/A 05/25/2014   Procedure: TRANSESOPHAGEAL ECHOCARDIOGRAM (TEE);  Surgeon: Purcell Nails, MD;  Location: [facility] OR;  Service: Open Heart Surgery;  Laterality: N/A;  . TONSILLECTOMY    . TRABECULECTOMY Left 03/01/2014   Procedure: TRABECULECTOMY;  Surgeon: Chalmers Guest, MD;  Location: [facility] OR;  Service: Ophthalmology;  Laterality: Left;    Social History   Socioeconomic History  . Marital status: Married    Spouse name: Not on file  . Number of children: Not on file  . Years of education: Not on file  . Highest education level: Not on file  Occupational History  . Not on file  Social Needs  . Financial resource strain: Not on file  . Food insecurity:    Worry: Not on file    Inability: Not on file  . Transportation needs:    Medical: Not on file    Non-medical: Not on file  Tobacco Use  . Smoking status: Former Smoker    Last attempt to quit: 03/01/1983    Years since quitting: 35.6  . Smokeless tobacco: Never Used  Substance and Sexual Activity  . Alcohol use: Yes    Alcohol/week: 21.0 standard drinks    Types: 21 Standard drinks or equivalent per week    Comment: 2 - 4 glasses of scotch daily  . Drug use: No  . Sexual activity: Not on file  Lifestyle  . Physical activity:    Days per week: Not on file    Minutes per session: Not on file  . Stress: Not on file  Relationships  . Social connections:    Talks on phone: Not on file    Gets together: Not on file    Attends religious service: Not on file    Active member of club or organization: Not on file    Attends meetings of clubs or organizations: Not on file    Relationship status: Not on file  . Intimate partner violence:    Fear of current or ex partner: Not on file    Emotionally abused: Not on file    Physically abused: Not on file    Forced sexual activity: Not on file  Other Topics Concern  . Not on file  Social History Narrative  . Not on file    Current  Outpatient Medications on File Prior to Visit  Medication Sig Dispense Refill  . aspirin 81 MG tablet Take 81 mg by mouth daily.    Marland Kitchen atropine 1 % ophthalmic solution Place 1 drop into the left eye daily.    . Brinzolamide-Brimonidine (SIMBRINZA) 1-0.2 % SUSP Place 1 drop into the right eye 3 (three) times daily.     . Difluprednate (DUREZOL) 0.05 % EMUL Place 1 drop into the left eye 3 (three) times daily.    . fish oil-omega-3 fatty acids 1000 MG capsule Take 1 g by mouth daily.    . metoprolol succinate (TOPROL-XL) 25 MG 24 hr tablet Take 25 mg by mouth daily.    . simvastatin (  ZOCOR) 20 MG tablet Take 20 mg by mouth daily.     . simvastatin (ZOCOR) 40 MG tablet TAKE 1 TABLET EVERY EVENING  AFTER  DINNER 90 tablet 0  . warfarin (COUMADIN) 3 MG tablet Take 3 mg by mouth daily.  0  . ZIOPTAN 0.0015 % SOLN   0   No current facility-administered medications on file prior to visit.     ***Review of Systems  Constitution: Negative for decreased appetite, malaise/fatigue, weight gain and weight loss.  Eyes: Negative for visual disturbance.  Cardiovascular: Positive for leg swelling. Negative for chest pain, claudication, dyspnea on exertion, orthopnea, palpitations and syncope.  Respiratory: Negative for hemoptysis and wheezing.   Endocrine: Negative for cold intolerance and heat intolerance.  Hematologic/Lymphatic: Does not bruise/bleed easily.  Skin: Positive for itching and rash (Hives being followed by dermatology). Negative for nail changes.  Musculoskeletal: Negative for muscle weakness and myalgias.  Gastrointestinal: Negative for abdominal pain, change in bowel habit, nausea and vomiting.  Neurological: Negative for difficulty with concentration, dizziness, focal weakness and headaches.  Psychiatric/Behavioral: Negative for altered mental status and suicidal ideas.  All other systems reviewed and are negative.     Objective  There were no vitals taken for this visit. There is no  height or weight on file to calculate BMI.    ***Physical Exam Radiology: No results found.  Laboratory examination:   *** CMP Latest Ref Rng & Units 06/02/2014 05/31/2014 05/30/2014  Glucose 70 - 99 mg/dL 161(W114(H) 960(A120(H) 98  BUN 6 - 23 mg/dL 21 18 22   Creatinine 0.50 - 1.35 mg/dL 5.401.15 9.811.11 1.911.06  Sodium 135 - 145 mmol/L 135 137 136  Potassium 3.5 - 5.1 mmol/L 4.5 3.9 3.1(L)  Chloride 96 - 112 mEq/L 98 100 100  CO2 19 - 32 mmol/L 30 30 29   Calcium 8.4 - 10.5 mg/dL 8.5 4.7(W8.1(L) 2.9(F8.0(L)  Total Protein 6.0 - 8.3 g/dL - - -  Total Bilirubin 0.3 - 1.2 mg/dL - - -  Alkaline Phos 39 - 117 U/L - - -  AST 0 - 37 U/L - - -  ALT 0 - 53 U/L - - -   CBC Latest Ref Rng & Units 05/31/2014 05/30/2014 05/29/2014  WBC 4.0 - 10.5 K/uL 9.9 11.1(H) 11.7(H)  Hemoglobin 13.0 - 17.0 g/dL 8.3(L) 8.3(L) 8.4(L)  Hematocrit 39.0 - 52.0 % 25.2(L) 25.2(L) 24.9(L)  Platelets 150 - 400 K/uL 140(L) 117(L) 93(L)   Lipid Panel  No results found for: CHOL, TRIG, HDL, CHOLHDL, VLDL, LDLCALC, LDLDIRECT HEMOGLOBIN A1C Lab Results  Component Value Date   HGBA1C 6.1 (H) 05/23/2014   MPG 128 (H) 05/23/2014   TSH No results for input(s): TSH in the last 8760 hours.  Cardiac Studies:   Echo- 05/22/2016 1. Left ventricle cavity is normal in size. Mild decrease in global wall motion. Visual EF is 45-50%. Diastolic function can't be assessed properly due to mitral annulus calcification, valve repair ring and mitral stenosis Calculated EF 51%. 2. Left atrial cavity is mildly dilated. 3. Trace aortic regurgitation. Mild aortic valve leaflet calcification. Mildly restricted aortic valve leaflets. No evidence of aortic valve stenosis. 4. Trace mitral regurgitation. Moderate calcification of the mitral valve annulus. Echoes of mitral valve repair ring are seen. Mild mitral valve stenosis, mean mitral gradient 3 mm of Hg, calculated valve area 1.4 cm2. 5. Mild to moderate tricuspid regurgitation. Mild to moderate pulmonary  hypertension with approx. PA syst. pressure of 42 mm of Hg. 6. No diagnostic change c.f.  echo. of 06/21/2014.  Coronary angiogram 05/13/2014: Mild disease in the mid RCA, proximal 80-85% calcific LAD stenosis and moderate pulmonary hypertension.  Assessment   Atherosclerosis of native coronary artery of native heart without angina pectoris  S/P mitral valve repair  Paroxysmal atrial fibrillation (HCC)  Long term (current) use of anticoagulants  Chronic kidney disease, stage 3 (HCC)   EKG 01/29/2018: Sinus bradycardia with first-degree AV block, at 55 bpm. Frequent PVCs, pattern of ventricular bigeminy. Nonspecific ST abnormality. No significant change from EKG 06/19/2017, 12/22/2016  Recommendations:   ***  Yates Decamp, MD, [facility] 10/07/2018, 7:15 AM [facility] [place] Pager: [phone] Office: [phone] If no answer Cell [phone]

## 2018-10-07 ENCOUNTER — Ambulatory Visit: Payer: Medicare HMO | Admitting: Cardiology

## 2018-10-07 ENCOUNTER — Telehealth: Payer: Self-pay

## 2018-10-19 ENCOUNTER — Other Ambulatory Visit: Payer: Self-pay

## 2018-10-19 ENCOUNTER — Ambulatory Visit (INDEPENDENT_AMBULATORY_CARE_PROVIDER_SITE_OTHER): Payer: Medicare HMO | Admitting: Cardiology

## 2018-10-19 DIAGNOSIS — I480 Paroxysmal atrial fibrillation: Secondary | ICD-10-CM | POA: Diagnosis not present

## 2018-10-19 LAB — POCT INR
INR: 1.9 — AB (ref 2.0–3.0)
INR: 1.9 — AB (ref 2.0–3.0)

## 2018-10-19 NOTE — Progress Notes (Signed)
INR slightly decreased today at 1.9 (range is 2-3 for A fib). I have reviewed his trends in epic and in allscripts, INR has been stable around 2 since Sept 2019. Will continue with current regimen. Will repeat INR in 3 weeks for close monitoring. No bleeding reported.

## 2018-10-25 ENCOUNTER — Other Ambulatory Visit: Payer: Self-pay | Admitting: Cardiology

## 2018-10-25 NOTE — Telephone Encounter (Signed)
Please fill

## 2018-11-09 ENCOUNTER — Encounter: Payer: Self-pay | Admitting: Cardiology

## 2018-11-09 ENCOUNTER — Ambulatory Visit (INDEPENDENT_AMBULATORY_CARE_PROVIDER_SITE_OTHER): Payer: Medicare HMO | Admitting: Cardiology

## 2018-11-09 ENCOUNTER — Other Ambulatory Visit: Payer: Self-pay

## 2018-11-09 VITALS — BP 142/63 | HR 44 | Temp 98.7°F | Ht 73.0 in | Wt 191.1 lb

## 2018-11-09 DIAGNOSIS — I2510 Atherosclerotic heart disease of native coronary artery without angina pectoris: Secondary | ICD-10-CM | POA: Diagnosis not present

## 2018-11-09 DIAGNOSIS — N183 Chronic kidney disease, stage 3 unspecified: Secondary | ICD-10-CM

## 2018-11-09 DIAGNOSIS — E7800 Pure hypercholesterolemia, unspecified: Secondary | ICD-10-CM

## 2018-11-09 DIAGNOSIS — Z5181 Encounter for therapeutic drug level monitoring: Secondary | ICD-10-CM | POA: Diagnosis not present

## 2018-11-09 DIAGNOSIS — Z7901 Long term (current) use of anticoagulants: Secondary | ICD-10-CM | POA: Diagnosis not present

## 2018-11-09 DIAGNOSIS — Z9889 Other specified postprocedural states: Secondary | ICD-10-CM

## 2018-11-09 DIAGNOSIS — I480 Paroxysmal atrial fibrillation: Secondary | ICD-10-CM

## 2018-11-09 DIAGNOSIS — I493 Ventricular premature depolarization: Secondary | ICD-10-CM

## 2018-11-09 DIAGNOSIS — I129 Hypertensive chronic kidney disease with stage 1 through stage 4 chronic kidney disease, or unspecified chronic kidney disease: Secondary | ICD-10-CM

## 2018-11-09 LAB — POCT INR: INR: 1.7 — AB (ref 2.0–3.0)

## 2018-11-09 MED ORDER — WARFARIN SODIUM 4 MG PO TABS: 4.0000 mg | ORAL_TABLET | Freq: Once | ORAL | 3 refills | Status: DC

## 2018-11-09 NOTE — Progress Notes (Signed)
Primary Physician/Referring:  Vicenta Aly, FNP  Patient ID: Trevor Aguilar, male    DOB: June 06, 1936, 83 y.o.   MRN: [id number]  Chief Complaint  Patient presents with  . Congestive Heart Failure  . Coronary Artery Disease  . Atrial Fibrillation  . Hypertension  . Follow-up    INR check    HPI: Trevor Aguilar  is a 83 y.o. male  With CAD S/P CABG and mitral repair on 05/25/2014 with placement of a 28 mm angioplasty drink along with LIMA to LAD and SVG to RCA by Dr. Darylene Price.  He denies any worsening shortness of breath, PND or orthopnea. No chest pain or palpitations. Tolerating Coumadin without bleeding diathesis. H  e had postCABGA fib; however, has been maintaing sinus rhythm and prefers to continue anticoagulation in view of risk of stroke. Has chronic frequent PVCs and bradycardia that is asymptomatic and has been stable. He does continue to have leg swelling that has been stable. .  Past Medical History:  Diagnosis Date  . Acute on chronic diastolic congestive heart failure ([place]) 05/18/2014  . Aortic valve sclerosis 05/11/2014   Aortic valve sclerosis w/out stenosis  . Bilateral cataracts   . Chronic diastolic congestive heart failure ([place]) 05/18/2014  . Coronary artery disease 05/12/2014   High grade proximal LAD stenosis and moderate RCA stenosis  . Glaucoma   . Hearing loss    bilateral hearing aids  . Heart murmur   . High cholesterol   . History of skull fracture 1978   lower skull  . Hypercholesteremia   . Hypertension   . Mitral regurgitation 05/12/2014   Mitral valve prolapse with flail segment of posterior leaflet and severe mitral regurgitation   . Mitral regurgitation 05/10/2014   Mitral valve prolapse with flail segment of posterior leaflet and severe mitral regurgitation   . Nonspecific abnormal electrocardiogram (ECG) (EKG)    Pt states he had a cardiac workup and passed all tests (Echo and Stress)  . Pneumonia 1967  . Pulmonary hypertension  ([place]) 05/12/2014  . Rash, skin Dec 2015   near buttocks; PCP aware   . 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 Shortness of breath dyspnea    with ambulation  . Tricuspid regurgitation 05/02/2014   Moderate TR by transthoracic ECHO  . Umbilical hernia     Past Surgical History:  Procedure Laterality Date  . CARDIAC CATHETERIZATION    . CATARACT EXTRACTION W/PHACO Left 03/01/2014   Procedure: CATARACT EXTRACTION PHACO AND INTRAOCULAR LENS PLACEMENT (IOC);  Surgeon: Marylynn Pearson, MD;  Location: [place];  Service: Ophthalmology;  Laterality: Left;  . COLONOSCOPY    . CORONARY ARTERY BYPASS GRAFT N/A 05/25/2014   Procedure: CORONARY ARTERY BYPASS GRAFTING (CABG)TIMES 2 USING LEFT INTERNAL MAMMARY AND LEFT SAPHENOUS VEIN HARVESTED ENDOSCOPICALLY;  Surgeon: Rexene Alberts, MD;  Location: [place];  Service: Open Heart Surgery;  Laterality: N/A;  . EYE SURGERY Right 05/14/12   cataract removal with lens implant  . FACIAL FRACTURE SURGERY  1970's  . LEFT AND RIGHT HEART CATHETERIZATION WITH CORONARY ANGIOGRAM N/A 05/12/2014   Procedure: LEFT AND RIGHT HEART CATHETERIZATION WITH CORONARY ANGIOGRAM;  Surgeon: Laverda Page, MD;  Location: [facility] CATH LAB;  Service: Cardiovascular;  Laterality: N/A;  . MITRAL VALVE REPAIR N/A 05/25/2014   Procedure: MITRAL VALVE REPAIR (MVR) WITH CLOSURE OF PATENT FORAMEN OVALE;  Surgeon: Rexene Alberts, MD;  Location: [place];  Service: Open Heart Surgery;  Laterality: N/A;  . TEE WITHOUT CARDIOVERSION N/A 05/11/2014   Procedure: TRANSESOPHAGEAL ECHOCARDIOGRAM (TEE);  Surgeon: Laverda Page, MD;  Location: [place];  Service: Cardiovascular;  Laterality: N/A;  . TEE WITHOUT CARDIOVERSION N/A 05/25/2014    Procedure: TRANSESOPHAGEAL ECHOCARDIOGRAM (TEE);  Surgeon: Rexene Alberts, MD;  Location: [place];  Service: Open Heart Surgery;  Laterality: N/A;  . TONSILLECTOMY    . TRABECULECTOMY Left 03/01/2014   Procedure: TRABECULECTOMY;  Surgeon: Marylynn Pearson, MD;  Location: [place];  Service: Ophthalmology;  Laterality: Left;    Social History   Socioeconomic History  . Marital status: Married    Spouse name: Not on file  . Number of children: 3  . Years of education: Not on file  . Highest education level: Not on file  Occupational History  . Not on file  Social Needs  . Financial resource strain: Not on file  . Food insecurity:    Worry: Not on file    Inability: Not on file  . Transportation needs:    Medical: Not on file    Non-medical: Not on file  Tobacco Use  . Smoking status: Former Smoker    Packs/day: 1.00    Years: 50.00    Pack years: 50.00    Last attempt to quit: 03/01/1983    Years since quitting: 35.7  . Smokeless tobacco: Never Used  Substance and Sexual Activity  . Alcohol use: Yes    Alcohol/week: 21.0 standard drinks    Types: 21 Standard drinks or equivalent per week    Comment: 2 - 4 glasses of scotch daily  . Drug use: No  . Sexual activity: Not on file  Lifestyle  . Physical activity:    Days per week: Not on file    Minutes per session: Not on file  . Stress: Not on file  Relationships  . Social connections:    Talks on phone: Not on file    Gets together: Not on file    Attends religious service: Not on file    Active member of club or organization: Not on file    Attends meetings of clubs or organizations: Not on file    Relationship status: Not on file  . Intimate partner violence:    Fear of current or ex partner: Not on file    Emotionally abused: Not on file    Physically abused: Not on file    Forced sexual activity: Not on file  Other Topics Concern  . Not on file  Social History Narrative  . Not on file    Review of Systems   Constitution: Negative for chills, decreased appetite, malaise/fatigue and weight gain.  Cardiovascular: Positive for leg swelling (chronic right leg worse). Negative for dyspnea on exertion and syncope.  Endocrine: Negative for cold intolerance.  Hematologic/Lymphatic: Does not bruise/bleed easily.  Musculoskeletal: Negative for joint swelling.  Gastrointestinal: Negative for abdominal pain, anorexia, change in bowel habit, hematochezia and melena.  Neurological: Negative for headaches and light-headedness.  Psychiatric/Behavioral: Negative for depression and substance abuse.  All other systems reviewed and are negative.     Objective  Blood pressure (!) 142/63, pulse (!) 44, temperature 98.7 F (37.1 C), height '6\' 1"'$  (1.854 m), weight 191 lb 1.6  oz (86.7 kg), SpO2 98 %. Body mass index is 25.21 kg/m.    Physical Exam  Constitutional: He appears well-developed and well-nourished. No distress.  HENT:  Head: Atraumatic.  Eyes: Conjunctivae are normal.  Neck: Neck supple. No JVD present. No thyromegaly present.  Cardiovascular: Normal rate and normal heart sounds. An irregularly irregular rhythm present. Frequent extrasystoles are present. Exam reveals no gallop.  No murmur heard. Right lower except he 2+ pitting edema below the knee, left leg trace edema.  Normal carotids, normal femoral and popliteal artery, DP 1-2+ bilateral.  PT could not be felt.  Pulmonary/Chest: Effort normal and breath sounds normal.  Abdominal: Soft. Bowel sounds are normal.  Musculoskeletal: Normal range of motion.  Neurological: He is alert.  Skin: Skin is warm and dry.  Psychiatric: He has a normal mood and affect.   Radiology: No results found.  Laboratory examination:   Labs 08/24/2017: Hb 14.1/HCT 41.4, platelets 282.  Normal indicis.  Labs 12/11/2016: HB 14.5/HCT 42.6, platelets 208, normal indicis.  Serum glucose 103 mg, BUN 14, creatinine 1.1, eGFR 57 mL, potassium 4.4.  Total cholesterol 122,  triglycerides 83, HDL 40, LDL 65.  PRN Meds:. Medications Discontinued During This Encounter  Medication Reason  . aspirin 81 MG tablet Error  . Brinzolamide-Brimonidine (SIMBRINZA) 1-0.2 % SUSP Error  . atropine 1 % ophthalmic solution Error  . Difluprednate (DUREZOL) 0.05 % EMUL Error  . fish oil-omega-3 fatty acids 1000 MG capsule Error  . simvastatin (ZOCOR) 20 MG tablet Error  . ZIOPTAN 0.0015 % SOLN Error  . warfarin (COUMADIN) 4 MG tablet Reorder   Current Meds  Medication Sig  . metoprolol succinate (TOPROL-XL) 25 MG 24 hr tablet TAKE 1 TABLET EVERY DAY  . simvastatin (ZOCOR) 40 MG tablet TAKE 1 TABLET EVERY EVENING  AFTER  DINNER (Patient taking differently: 40 mg. )  . warfarin (COUMADIN) 3 MG tablet Take 3 mg by mouth daily. 31m M, T, W, Th, Sat. Take 342mFri, Sun  . warfarin (COUMADIN) 4 MG tablet Take 1 tablet (4 mg total) by mouth one time only at 6 PM. One tablet M, T, W, Th, Sat. Take 8m10mab Fri and Sun as directed  . [DISCONTINUED] warfarin (COUMADIN) 4 MG tablet Take 4 mg by mouth one time only at 6 PM. As directed per coumadin clinic   Cardiac Studies:     Echo- 05/22/2016 1. Left ventricle cavity is normal in size. Mild decrease in global wall motion. Visual EF is 45-50%. Diastolic function can't be assessed properly due to mitral annulus calcification, valve repair ring and mitral stenosis Calculated EF 51%. 2. Left atrial cavity is mildly dilated. 3. Trace aortic regurgitation. Mild aortic valve leaflet calcification. Mildly restricted aortic valve leaflets. No evidence of aortic valve stenosis. 4. Trace mitral regurgitation. Moderate calcification of the mitral valve annulus. Echoes of mitral valve repair ring are seen. Mild mitral valve stenosis, mean mitral gradient 3 mm of Hg, calculated valve area 1.4 cm2. 5. Mild to moderate tricuspid regurgitation. Mild to moderate pulmonary hypertension with approx. PA syst. pressure of 42 mm of Hg. 6. No diagnostic  change c.f. echo. of 06/21/2014.  CABG and mitral valve repair on 05/25/2014 with triangular resection of posterior leaflet, artificial Gore-Tex neochord placement 2, Sorin Memo 3D 28 mm annuloplasty ring placement, LIMA to LAD and SVG to RCA by Dr. CubLilly CoveAssessment   Paroxysmal atrial fibrillation (HCC) CHA2DS2-VASc Score is 3 with yearly risk of stroke of 3.2 %. -  Plan: POCT INR, warfarin (COUMADIN) 4 MG tablet, CBC, TSH  S/P MVR (mitral valve repair) - CABG and mitral valve repair on 05/25/2014: Sorin Memo 3D 28 mm annuloplasty ring placement, LIMA to LAD and SVG to RCA by Dr. Lilly Cove.  Coronary artery disease involving native coronary artery of native heart without angina pectoris  CKD (chronic kidney disease) stage 3, GFR 30-59 ml/min (HCC) - Plan: CMP14+EGFR  Hypercholesteremia - Plan: Lipid Panel With LDL/HDL Ratio  Frequent PVCs  EKG 01/29/2018: Sinus bradycardia with first-degree AV block, at 55 bpm.  Frequent PVCs, pattern of ventricular bigeminy.  Nonspecific ST abnormality.  No significant change from EKG 06/19/2017, 12/22/2016  Recommendations:   Today May 17 - Nov 27 2018 Dorene Grebe Tue [facility] Thu Fri Sat  17 3 mg 18 3 mg 19 4 mg 20 3 mg 21 4 mg 22 3 mg 23 4 mg  24 3 mg 25 3 mg 26 4 mg 27 3 mg 28 4 mg 29 3 mg 30 4 mg  31 3 mg Jun 1 3 mg 2 1.7 4 mg 3 4 mg 4 4 mg 5 3 mg 6 4 mg  7 3 mg 8 4 mg 9 4 mg 10 4 mg 11 4 mg 12 3 mg 13 4 mg  14 3 mg 15 4 mg 16 4 mg 17 4 mg 18 4 mg 19 3 mg 20 4 mg   I'm increasing the dose of warfarin by 2 mg every week.  Please see above schedule.  INR was subtherapeutic. I will recheck INR in 3 weeks.  He is been maintaining sinus rhythm with frequent PVCs.  He has no symptoms of chest pain or congestive heart failure on exam, he does have 2+ right leg edema that is chronic.  He has not had any labs in the past year and a half to 2 years, I'll obtain CBC, CMP, lipids, and TSH as well, I have advised him to  contact Ms. Vicenta Aly, NP for annual visit and check up.  From cardiac standpoint he seems to be doing well and I'm very pleased with his progress.  I'll see him back in 2 months to discuss medications and also go over the labs. He'll continue with anti-coagulation clinic.  Adrian Prows, MD, [facility] 11/09/2018, 5:27 PM [facility] [place] Pager: [phone] Office: [phone] If no answer Cell [phone]

## 2018-11-15 ENCOUNTER — Other Ambulatory Visit: Payer: Self-pay | Admitting: Cardiology

## 2018-11-30 ENCOUNTER — Other Ambulatory Visit: Payer: Self-pay

## 2018-11-30 ENCOUNTER — Ambulatory Visit (INDEPENDENT_AMBULATORY_CARE_PROVIDER_SITE_OTHER): Payer: Medicare HMO | Admitting: Cardiology

## 2018-11-30 DIAGNOSIS — I480 Paroxysmal atrial fibrillation: Secondary | ICD-10-CM

## 2018-11-30 DIAGNOSIS — Z5181 Encounter for therapeutic drug level monitoring: Secondary | ICD-10-CM | POA: Diagnosis not present

## 2018-11-30 DIAGNOSIS — Z7901 Long term (current) use of anticoagulants: Secondary | ICD-10-CM

## 2018-11-30 LAB — POCT INR: INR: 2.5 (ref 2.0–3.0)

## 2018-11-30 NOTE — Progress Notes (Signed)
INR within range of 2-3. Tolerating Coumadin well without any bleeding diathesis. Last INR was 1.7, will repeat in 3 weeks for close monitoring.

## 2018-12-21 ENCOUNTER — Ambulatory Visit (INDEPENDENT_AMBULATORY_CARE_PROVIDER_SITE_OTHER): Payer: Medicare HMO | Admitting: Cardiology

## 2018-12-21 ENCOUNTER — Other Ambulatory Visit: Payer: Self-pay

## 2018-12-21 DIAGNOSIS — Z7901 Long term (current) use of anticoagulants: Secondary | ICD-10-CM | POA: Diagnosis not present

## 2018-12-21 DIAGNOSIS — I480 Paroxysmal atrial fibrillation: Secondary | ICD-10-CM

## 2018-12-21 DIAGNOSIS — Z5181 Encounter for therapeutic drug level monitoring: Secondary | ICD-10-CM

## 2018-12-21 LAB — POCT INR: INR: 2.3 (ref 2.0–3.0)

## 2018-12-23 NOTE — Progress Notes (Signed)
INR 2.3.   Current maintenance plan:  3 mg every Sun, Fri; 4 mg all other days [] No changeStart Over  Maintenance plan weekly total: 26 mg Tablets on hand: 4 mg & 3 mg  Total dose from past 7 days: 26 mg

## 2019-01-01 LAB — CMP14+EGFR
ALT: 28 IU/L (ref 0–44)
AST: 27 IU/L (ref 0–40)
Albumin/Globulin Ratio: 1.8 (ref 1.2–2.2)
Albumin: 4.2 g/dL (ref 3.6–4.6)
Alkaline Phosphatase: 116 IU/L (ref 39–117)
BUN/Creatinine Ratio: 12 (ref 10–24)
BUN: 13 mg/dL (ref 8–27)
Bilirubin Total: 1.6 mg/dL — ABNORMAL HIGH (ref 0.0–1.2)
CO2: 26 mmol/L (ref 20–29)
Calcium: 9.6 mg/dL (ref 8.6–10.2)
Chloride: 101 mmol/L (ref 96–106)
Creatinine, Ser: 1.06 mg/dL (ref 0.76–1.27)
GFR calc Af Amer: 76 mL/min/{1.73_m2} (ref >59)
GFR calc non Af Amer: 65 mL/min/{1.73_m2} (ref >59)
Globulin, Total: 2.3 g/dL (ref 1.5–4.5)
Glucose: 105 mg/dL — ABNORMAL HIGH (ref 65–99)
Potassium: 4.7 mmol/L (ref 3.5–5.2)
Sodium: 140 mmol/L (ref 134–144)
Total Protein: 6.5 g/dL (ref 6.0–8.5)

## 2019-01-01 LAB — CBC
Hematocrit: 41.1 % (ref 37.5–51.0)
Hemoglobin: 13.8 g/dL (ref 13.0–17.7)
MCH: 32.8 pg (ref 26.6–33.0)
MCHC: 33.6 g/dL (ref 31.5–35.7)
MCV: 98 fL — ABNORMAL HIGH (ref 79–97)
Platelets: 181 10*3/uL (ref 150–450)
RBC: 4.21 x10E6/uL (ref 4.14–5.80)
RDW: 12.6 % (ref 11.6–15.4)
WBC: 6.5 10*3/uL (ref 3.4–10.8)

## 2019-01-01 LAB — LIPID PANEL WITH LDL/HDL RATIO
Cholesterol, Total: 105 mg/dL (ref 100–199)
HDL: 35 mg/dL — ABNORMAL LOW (ref >39)
LDL Calculated: 55 mg/dL (ref 0–99)
LDl/HDL Ratio: 1.6 ratio (ref 0.0–3.6)
Triglycerides: 73 mg/dL (ref 0–149)
VLDL Cholesterol Cal: 15 mg/dL (ref 5–40)

## 2019-01-01 LAB — TSH: TSH: 2.36 u[IU]/mL (ref 0.450–4.500)

## 2019-01-14 ENCOUNTER — Ambulatory Visit (INDEPENDENT_AMBULATORY_CARE_PROVIDER_SITE_OTHER): Payer: Medicare HMO | Admitting: Cardiology

## 2019-01-14 ENCOUNTER — Other Ambulatory Visit: Payer: Self-pay

## 2019-01-14 ENCOUNTER — Encounter: Payer: Self-pay | Admitting: Cardiology

## 2019-01-14 VITALS — BP 140/72 | HR 43 | Ht 72.0 in | Wt 183.0 lb

## 2019-01-14 DIAGNOSIS — I272 Pulmonary hypertension, unspecified: Secondary | ICD-10-CM | POA: Diagnosis not present

## 2019-01-14 DIAGNOSIS — Z7901 Long term (current) use of anticoagulants: Secondary | ICD-10-CM

## 2019-01-14 DIAGNOSIS — I493 Ventricular premature depolarization: Secondary | ICD-10-CM | POA: Diagnosis not present

## 2019-01-14 DIAGNOSIS — I2510 Atherosclerotic heart disease of native coronary artery without angina pectoris: Secondary | ICD-10-CM | POA: Diagnosis not present

## 2019-01-14 DIAGNOSIS — I480 Paroxysmal atrial fibrillation: Secondary | ICD-10-CM | POA: Diagnosis not present

## 2019-01-14 LAB — POCT INR: INR: 2.5 (ref 2.0–3.0)

## 2019-01-14 NOTE — Progress Notes (Signed)
Primary Physician/Referring:  Vicenta Aly, FNP  Patient ID: Trevor Aguilar, male    DOB: 11-05-1935, 83 y.o.   MRN: [id number]  Chief Complaint  Patient presents with  . Coronary Artery Disease  . Hypertension  . Follow-up    HPI: Trevor Aguilar  is a 83 y.o. male  With CAD S/P CABG and mitral repair on 05/25/2014 with placement of a 28 mm angioplasty drink along with LIMA to LAD and SVG to RCA by Dr. Darylene Price.  He denies any worsening shortness of breath, PND or orthopnea. No chest pain or palpitations. Tolerating Coumadin without bleeding diathesis. H  He had post CABG A fib; however, has been maintaing sinus rhythm and prefers to continue anticoagulation in view of risk of stroke. Has chronic frequent PVCs and bradycardia that is asymptomatic and has been stable. He does continue to have leg swelling that has been stable. .  Past Medical History:  Diagnosis Date  . Acute on chronic diastolic congestive heart failure ([place]) 05/18/2014  . Aortic valve sclerosis 05/11/2014   Aortic valve sclerosis w/out stenosis  . Bilateral cataracts   . Chronic diastolic congestive heart failure ([place]) 05/18/2014  . Coronary artery disease 05/12/2014   High grade proximal LAD stenosis and moderate RCA stenosis  . Glaucoma   . Hearing loss    bilateral hearing aids  . Heart murmur   . High cholesterol   . History of skull fracture 1978   lower skull  . Hypercholesteremia   . Hypertension   . Mitral regurgitation 05/12/2014   Mitral valve prolapse with flail segment of posterior leaflet and severe mitral regurgitation   . Mitral regurgitation 05/10/2014   Mitral valve prolapse with flail segment of posterior leaflet and severe mitral regurgitation   . Nonspecific abnormal electrocardiogram (ECG) (EKG)    Pt states he had a cardiac workup and passed all tests (Echo and Stress)  . Pneumonia 1967  . Pulmonary hypertension ([place]) 05/12/2014  . Rash, skin Dec 2015   near buttocks; PCP  aware   . 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 Shortness of breath dyspnea    with ambulation  . Tricuspid regurgitation 05/02/2014   Moderate TR by transthoracic ECHO  . Umbilical hernia     Past Surgical History:  Procedure Laterality Date  . CARDIAC CATHETERIZATION    . CATARACT EXTRACTION W/PHACO Left 03/01/2014   Procedure: CATARACT EXTRACTION PHACO AND INTRAOCULAR LENS PLACEMENT (IOC);  Surgeon: Marylynn Pearson, MD;  Location: [place];  Service: Ophthalmology;  Laterality: Left;  . COLONOSCOPY    . CORONARY ARTERY BYPASS GRAFT N/A 05/25/2014   Procedure: CORONARY ARTERY BYPASS GRAFTING (CABG)TIMES 2 USING LEFT INTERNAL MAMMARY AND LEFT SAPHENOUS VEIN HARVESTED ENDOSCOPICALLY;  Surgeon: Rexene Alberts, MD;  Location: [place];  Service: Open Heart Surgery;  Laterality: N/A;  . EYE SURGERY Right 05/14/12   cataract removal with lens implant  . FACIAL FRACTURE SURGERY  1970's  . LEFT AND RIGHT HEART CATHETERIZATION WITH CORONARY ANGIOGRAM N/A 05/12/2014   Procedure: LEFT AND RIGHT HEART CATHETERIZATION WITH CORONARY ANGIOGRAM;  Surgeon: Laverda Page, MD;  [facility] CATH LAB;  Service: Cardiovascular;  Laterality: N/A;  . MITRAL VALVE REPAIR N/A 05/25/2014  Procedure: MITRAL VALVE REPAIR (MVR) WITH CLOSURE OF PATENT FORAMEN OVALE;  Surgeon: Rexene Alberts, MD;  Location: [place];  Service: Open Heart Surgery;  Laterality: N/A;  . TEE WITHOUT CARDIOVERSION N/A 05/11/2014   Procedure: TRANSESOPHAGEAL ECHOCARDIOGRAM (TEE);  Surgeon: Laverda Page, MD;  Location: [place];  Service: Cardiovascular;  Laterality: N/A;  . TEE WITHOUT CARDIOVERSION N/A 05/25/2014   Procedure: TRANSESOPHAGEAL ECHOCARDIOGRAM (TEE);  Surgeon:  Rexene Alberts, MD;  Location: [place];  Service: Open Heart Surgery;  Laterality: N/A;  . TONSILLECTOMY    . TRABECULECTOMY Left 03/01/2014   Procedure: TRABECULECTOMY;  Surgeon: Marylynn Pearson, MD;  Location: [place];  Service: Ophthalmology;  Laterality: Left;    Social History   Socioeconomic History  . Marital status: Widowed    Spouse name: Not on file  . Number of children: 3  . Years of education: Not on file  . Highest education level: Not on file  Occupational History  . Not on file  Social Needs  . Financial resource strain: Not on file  . Food insecurity    Worry: Not on file    Inability: Not on file  . Transportation needs    Medical: Not on file    Non-medical: Not on file  Tobacco Use  . Smoking status: Former Smoker    Packs/day: 1.00    Years: 50.00    Pack years: 50.00    Quit date: 03/01/1983    Years since quitting: 35.9  . Smokeless tobacco: Never Used  Substance and Sexual Activity  . Alcohol use: Yes    Alcohol/week: 21.0 standard drinks    Types: 21 Standard drinks or equivalent per week    Comment: 2 - 4 glasses of scotch daily  . Drug use: No  . Sexual activity: Not on file  Lifestyle  . Physical activity    Days per week: Not on file    Minutes per session: Not on file  . Stress: Not on file  Relationships  . Social Herbalist on phone: Not on file    Gets together: Not on file    Attends religious service: Not on file    Active member of club or organization: Not on file    Attends meetings of clubs or organizations: Not on file    Relationship status: Not on file  . Intimate partner violence    Fear of current or ex partner: Not on file    Emotionally abused: Not on file    Physically abused: Not on file    Forced sexual activity: Not on file  Other Topics Concern  . Not on file  Social History Narrative  . Not on file   Review of Systems  Constitution: Negative for chills, decreased appetite, malaise/fatigue and weight  gain.  Cardiovascular: Positive for leg swelling (chronic right leg worse). Negative for dyspnea on exertion and syncope.  Endocrine: Negative for cold intolerance.  Hematologic/Lymphatic: Does not bruise/bleed easily.  Musculoskeletal: Negative for joint swelling.  Gastrointestinal: Negative for abdominal pain, anorexia, change in bowel habit, hematochezia and melena.  Neurological: Negative for headaches and light-headedness.  Psychiatric/Behavioral: Negative for depression and substance abuse.  All other systems reviewed and are negative.  Objective  Blood pressure (!) 156/72, pulse (!) 43, height 6' (1.829 m), weight 183 lb (83 kg), SpO2 96 %. Body mass index is 24.82 kg/m.    Physical Exam  Constitutional: He appears well-developed and well-nourished. No distress.  HENT:  Head: Atraumatic.  Eyes: Conjunctivae are normal.  Neck: Neck supple. No JVD present. No thyromegaly present.  Cardiovascular: Normal rate and normal heart sounds. An irregularly irregular rhythm present. Frequent extrasystoles are present. Exam reveals no gallop.  No murmur heard. Right lower except he 2+ pitting edema below the knee, left leg trace edema.  Normal carotids, normal femoral and popliteal artery, DP 1-2+ bilateral.  PT could not be felt.  Pulmonary/Chest: Effort normal and breath sounds normal.  Abdominal: Soft. Bowel sounds are normal.  Musculoskeletal: Normal range of motion.  Neurological: He is alert.  Skin: Skin is warm and dry.  Psychiatric: He has a normal mood and affect.   Radiology: No results found.  Laboratory examination:   Labs 08/24/2017: Hb 14.1/HCT 41.4, platelets 282.  Normal indicis.  Labs 12/11/2016: HB 14.5/HCT 42.6, platelets 208, normal indicis.  Serum glucose 103 mg, BUN 14, creatinine 1.1, eGFR 57 mL, potassium 4.4.  Total cholesterol 122, triglycerides 83, HDL 40, LDL 65.  PRN Meds:. There are no discontinued medications. Current Meds  Medication Sig  .  metoprolol succinate (TOPROL-XL) 25 MG 24 hr tablet TAKE 1 TABLET EVERY DAY  . simvastatin (ZOCOR) 40 MG tablet TAKE 1 TABLET EVERY EVENING  AFTER  DINNER  . VITAMIN D PO Take by mouth daily.  Marland Kitchen warfarin (COUMADIN) 3 MG tablet Take 3 mg by mouth daily. 61m M, T, W, Th, Sat. Take 375mFri, Sun  . warfarin (COUMADIN) 4 MG tablet Take 1 tablet (4 mg total) by mouth one time only at 6 PM. One tablet M, T, W, Th, Sat. Take 66m51mab Fri and Sun as directed   Cardiac Studies:     Echo- 05/22/2016 1. Left ventricle cavity is normal in size. Mild decrease in global wall motion. Visual EF is 45-50%. Diastolic function can't be assessed properly due to mitral annulus calcification, valve repair ring and mitral stenosis Calculated EF 51%. 2. Left atrial cavity is mildly dilated. 3. Trace aortic regurgitation. Mild aortic valve leaflet calcification. Mildly restricted aortic valve leaflets. No evidence of aortic valve stenosis. 4. Trace mitral regurgitation. Moderate calcification of the mitral valve annulus. Echoes of mitral valve repair ring are seen. Mild mitral valve stenosis, mean mitral gradient 3 mm of Hg, calculated valve area 1.4 cm2. 5. Mild to moderate tricuspid regurgitation. Mild to moderate pulmonary hypertension with approx. PA syst. pressure of 42 mm of Hg. 6. No diagnostic change c.f. echo. of 06/21/2014.  CABG and mitral valve repair on 05/25/2014 with triangular resection of posterior leaflet, artificial Gore-Tex neochord placement 2, Sorin Memo 3D 28 mm annuloplasty ring placement, LIMA to LAD and SVG to RCA by Dr. CubLilly CoveAssessment     ICD-10-CM   1. Paroxysmal atrial fibrillation (HCC)  I48.0 POCT INR  2. Coronary artery disease involving native coronary artery of native heart without angina pectoris  I25.10   3. Frequent PVCs  I49.3   4. Pulmonary hypertension (HCC)  I27.20 EKG 12-Lead     EKG 01/14/2019: Atrial fibrillation with controlled ventricular response at the rate of  64 bpm, normal axis, incomplete right bundle branch block.  Frequent PVCs in the form of ventricular bigeminy.  Diffuse global nonspecific ST depressions, consider digitalis effect.  Consider ischemia.  Normal QT interval.  EKG 01/29/2018: Sinus bradycardia with first-degree AV block, at 55 bpm.  Frequent PVCs, pattern of ventricular bigeminy.  Nonspecific ST abnormality.  No significant change from EKG 06/19/2017, 12/22/2016  INR 2.5:  Current maintenance  plan: 3 mg every Sun, Fri; 4 mg all other days  No changeStart Over Maintenance plan weekly total: 26 mg Tablets on hand: 4 mg & 3 mg Total dose from past 7 days: 26 mg  Recommendations:   Patient presents here for 6 month office visit and follow-up approximately  paroxysmal fibrillation, coronary artery disease and frequent PVCs, he essentially remains asymptomatic, no clinical evidence of heart failure, heart rate is well controlled, he is today in atrial fibrillation.  No angina pectoris.   No changes in the medications were done today. He does have 2+ right leg edema that is chronic and advised him to wear them early in the morning.   From cardiac standpoint he seems to be doing well and I'm very pleased with his progress.  I'll see him back in 6 months.  He'll continue with anti-coagulation clinic. INR therapeutic today.  Adrian Prows, MD, [facility] 01/16/2019, 5:20 PM [facility]. [place] Pager: [phone] Office: [phone] If no answer Cell [phone]

## 2019-02-11 ENCOUNTER — Other Ambulatory Visit: Payer: Self-pay

## 2019-02-11 ENCOUNTER — Ambulatory Visit (INDEPENDENT_AMBULATORY_CARE_PROVIDER_SITE_OTHER): Payer: Medicare HMO | Admitting: Cardiology

## 2019-02-11 DIAGNOSIS — Z7901 Long term (current) use of anticoagulants: Secondary | ICD-10-CM | POA: Diagnosis not present

## 2019-02-11 DIAGNOSIS — Z5181 Encounter for therapeutic drug level monitoring: Secondary | ICD-10-CM | POA: Diagnosis not present

## 2019-02-11 DIAGNOSIS — I480 Paroxysmal atrial fibrillation: Secondary | ICD-10-CM | POA: Diagnosis not present

## 2019-02-11 LAB — POCT INR: INR: 3.6 — AB (ref 2.0–3.0)

## 2019-02-11 NOTE — Progress Notes (Signed)
Ref Range & Units 10:57 4wk ago 34mo ago 30mo ago  INR 2.0 - 3.0 3.6Abnormal   2.5 VC, CM  2.3  2.5    New maintenance plan:  3 mg every Sun, Mon, Fri; 4 mg all other days [] No changeStart Over  Maintenance plan weekly total: 25 mg3.8% Tablets on hand: 4 mg & 3 mg  Total dose from past 7 days: 26 mg     3 mg sun, mon/ /fri 4 mg all others  F/u 69mth

## 2019-03-14 ENCOUNTER — Other Ambulatory Visit: Payer: Self-pay

## 2019-03-14 ENCOUNTER — Ambulatory Visit (INDEPENDENT_AMBULATORY_CARE_PROVIDER_SITE_OTHER): Payer: Medicare HMO | Admitting: Cardiology

## 2019-03-14 DIAGNOSIS — I480 Paroxysmal atrial fibrillation: Secondary | ICD-10-CM

## 2019-03-14 DIAGNOSIS — Z7901 Long term (current) use of anticoagulants: Secondary | ICD-10-CM | POA: Diagnosis not present

## 2019-03-14 DIAGNOSIS — Z5181 Encounter for therapeutic drug level monitoring: Secondary | ICD-10-CM

## 2019-03-14 LAB — POCT INR: INR: 2.3 (ref 2.0–3.0)

## 2019-03-14 NOTE — Progress Notes (Signed)
Current maintenance plan:  3 mg every Sun, Mon, Fri; 4 mg all other days [x] No changeStart Over  Maintenance plan weekly total: 25 mg Tablets on hand: 4 mg & 3 mg  Total dose from past 7 days: 25 mg     INR within range, will continue with current regimen and repeat INR in 4 weeks.

## 2019-04-06 ENCOUNTER — Other Ambulatory Visit: Payer: Self-pay

## 2019-04-06 MED ORDER — WARFARIN SODIUM 3 MG PO TABS: 3.0000 mg | ORAL_TABLET | Freq: Every day | ORAL | 1 refills | Status: DC

## 2019-04-08 ENCOUNTER — Other Ambulatory Visit: Payer: Self-pay | Admitting: Cardiology

## 2019-04-14 ENCOUNTER — Other Ambulatory Visit: Payer: Self-pay

## 2019-04-14 ENCOUNTER — Ambulatory Visit (INDEPENDENT_AMBULATORY_CARE_PROVIDER_SITE_OTHER): Payer: Medicare HMO | Admitting: Cardiology

## 2019-04-14 DIAGNOSIS — Z7901 Long term (current) use of anticoagulants: Secondary | ICD-10-CM

## 2019-04-14 DIAGNOSIS — Z5181 Encounter for therapeutic drug level monitoring: Secondary | ICD-10-CM

## 2019-04-14 DIAGNOSIS — I4891 Unspecified atrial fibrillation: Secondary | ICD-10-CM

## 2019-04-14 DIAGNOSIS — I480 Paroxysmal atrial fibrillation: Secondary | ICD-10-CM

## 2019-04-14 LAB — POCT INR: INR: 2.7 (ref 2.0–3.0)

## 2019-05-13 ENCOUNTER — Ambulatory Visit (INDEPENDENT_AMBULATORY_CARE_PROVIDER_SITE_OTHER): Payer: Medicare HMO | Admitting: Cardiology

## 2019-05-13 ENCOUNTER — Other Ambulatory Visit: Payer: Self-pay

## 2019-05-13 DIAGNOSIS — Z5181 Encounter for therapeutic drug level monitoring: Secondary | ICD-10-CM | POA: Diagnosis not present

## 2019-05-13 DIAGNOSIS — Z7901 Long term (current) use of anticoagulants: Secondary | ICD-10-CM

## 2019-05-13 DIAGNOSIS — I480 Paroxysmal atrial fibrillation: Secondary | ICD-10-CM | POA: Diagnosis not present

## 2019-05-13 LAB — POCT INR
INR: 3.2 — AB (ref 2.0–3.0)
INR: 3.2 — AB (ref 2.0–3.0)

## 2019-05-13 NOTE — Progress Notes (Signed)
Current maintenance plan:  3 mg every Sun, Mon, Fri; 4 mg all other days [] No changeStart Over  Maintenance plan weekly total: 25 mg Tablets on hand: 4 mg & 3 mg  Total dose from past 7 days: 25 mg      INR elevated, but generally well controlled.No changes were made today. Will recheck in 1 week

## 2019-05-27 ENCOUNTER — Ambulatory Visit (INDEPENDENT_AMBULATORY_CARE_PROVIDER_SITE_OTHER): Payer: Medicare HMO | Admitting: Cardiology

## 2019-05-27 ENCOUNTER — Other Ambulatory Visit: Payer: Self-pay

## 2019-05-27 DIAGNOSIS — I480 Paroxysmal atrial fibrillation: Secondary | ICD-10-CM

## 2019-05-27 DIAGNOSIS — Z7901 Long term (current) use of anticoagulants: Secondary | ICD-10-CM

## 2019-05-27 DIAGNOSIS — Z5181 Encounter for therapeutic drug level monitoring: Secondary | ICD-10-CM

## 2019-05-27 LAB — POCT INR: INR: 3.7 — AB (ref 2.0–3.0)

## 2019-05-27 NOTE — Progress Notes (Signed)
Goal: 2.0-3.0 Indication: 3.7 (3.2 on 12/4. No change made then) Today's INR: Afib Current dose:3 mg Su, M, F, 4 mg all other days.  New dose: Hold today (12/18). 4 mg Tue, Thu, 3 mg all other days Next INR check: 2 weeks  Vernell Leep, MD

## 2019-06-13 ENCOUNTER — Ambulatory Visit (INDEPENDENT_AMBULATORY_CARE_PROVIDER_SITE_OTHER): Payer: Medicare HMO | Admitting: Cardiology

## 2019-06-13 ENCOUNTER — Other Ambulatory Visit: Payer: Self-pay

## 2019-06-13 DIAGNOSIS — Z5181 Encounter for therapeutic drug level monitoring: Secondary | ICD-10-CM

## 2019-06-13 DIAGNOSIS — I480 Paroxysmal atrial fibrillation: Secondary | ICD-10-CM

## 2019-06-13 DIAGNOSIS — Z7901 Long term (current) use of anticoagulants: Secondary | ICD-10-CM | POA: Diagnosis not present

## 2019-06-13 LAB — POCT INR: INR: 3.4 — AB (ref 2.0–3.0)

## 2019-06-13 NOTE — Progress Notes (Signed)
  Component      INR  Latest Ref Rng & Units      2.0 - 3.0  02/11/2019      3.6 (A)  03/14/2019      2.3  04/14/2019      2.7  05/13/2019     11:21 AM 3.2 (A)  05/13/2019     11:21 AM 3.2 (A)  05/27/2019      3.7 (A)  06/13/2019      3.4 (A)    New maintenance plan:  4 mg every Tue, Thu; 3 mg all other days No changeStart Over  Maintenance plan weekly total: 23 mg 8% Tablets on hand: 4 mg & 3 mg  Total dose from past 7 days: 25 mg    Changed to 4 mg Tue, Thur; 3 mg all other days (was on 4 mg 4 times a week and 3 mg 3 times a week)  INR 2.0-3.0 Afib. Recheck in 3 weeks on 07/04/2019

## 2019-07-05 ENCOUNTER — Other Ambulatory Visit: Payer: Self-pay

## 2019-07-05 ENCOUNTER — Ambulatory Visit (INDEPENDENT_AMBULATORY_CARE_PROVIDER_SITE_OTHER): Payer: Medicare HMO | Admitting: Cardiology

## 2019-07-05 DIAGNOSIS — Z5181 Encounter for therapeutic drug level monitoring: Secondary | ICD-10-CM | POA: Diagnosis not present

## 2019-07-05 DIAGNOSIS — I480 Paroxysmal atrial fibrillation: Secondary | ICD-10-CM

## 2019-07-05 DIAGNOSIS — Z7901 Long term (current) use of anticoagulants: Secondary | ICD-10-CM

## 2019-07-05 LAB — POCT INR: INR: 3.6 — AB (ref 2.0–3.0)

## 2019-07-06 NOTE — Progress Notes (Signed)
New maintenance plan:  3 mg every day [] No changeStart Over  Maintenance plan weekly total: 21 mg8.7% Tablets on hand: 4 mg & 3 mg  Total dose from past 7 days: 23 mg    INR continues to be supratherapuetic. Will hold Coumadin today and tomorrow, then restart Coumadin 3 mg all days. Recheck in 10 days.

## 2019-07-15 ENCOUNTER — Other Ambulatory Visit: Payer: Self-pay

## 2019-07-15 ENCOUNTER — Ambulatory Visit (INDEPENDENT_AMBULATORY_CARE_PROVIDER_SITE_OTHER): Payer: Medicare HMO | Admitting: Cardiology

## 2019-07-15 DIAGNOSIS — I480 Paroxysmal atrial fibrillation: Secondary | ICD-10-CM

## 2019-07-15 DIAGNOSIS — Z7901 Long term (current) use of anticoagulants: Secondary | ICD-10-CM

## 2019-07-15 DIAGNOSIS — Z5181 Encounter for therapeutic drug level monitoring: Secondary | ICD-10-CM | POA: Diagnosis not present

## 2019-07-15 LAB — POCT INR: INR: 2.3 (ref 2.0–3.0)

## 2019-07-16 NOTE — Progress Notes (Signed)
INR within range of 2-3 today at 2.3. Will continue with current regimen and repeat in 1 month.

## 2019-07-21 ENCOUNTER — Ambulatory Visit: Payer: Medicare HMO | Admitting: Cardiology

## 2019-08-02 ENCOUNTER — Ambulatory Visit: Payer: Medicare HMO | Admitting: Cardiology

## 2019-08-02 NOTE — Progress Notes (Deleted)
Primary Physician/Referring:  Elizabeth Palau, FNP  Patient ID: Trevor Aguilar, male    DOB: 25-Apr-1936, 84 y.o.   MRN: [id number]  No chief complaint on file.  HPI:    Trevor Aguilar  is a 84 y.o. Caucasian male patient  with CAD S/P CABG and mitral repair on 05/25/2014 with placement of a 28 mm annuloplasty ring along with LIMA to LAD and SVG to RCA by Dr. Tressie Stalker. PAF, chronic frequent PVCs and bradycardia that is asymptomatic and chronic leg edema. Past medical history significant for hypertension, hyperlipidemia, stage III chronic kidney disease.  ***  Past Medical History:  Diagnosis Date  . Acute on chronic diastolic congestive heart failure (HCC) 05/18/2014  . Aortic valve sclerosis 05/11/2014   Aortic valve sclerosis w/out stenosis  . Bilateral cataracts   . Chronic diastolic congestive heart failure (HCC) 05/18/2014  . Coronary artery disease 05/12/2014   High grade proximal LAD stenosis and moderate RCA stenosis  . Glaucoma   . Hearing loss    bilateral hearing aids  . Heart murmur   . High cholesterol   . History of skull fracture 1978   lower skull  . Hypercholesteremia   . Hypertension   . Mitral regurgitation 05/12/2014   Mitral valve prolapse with flail segment of posterior leaflet and severe mitral regurgitation   . Mitral regurgitation 05/10/2014   Mitral valve prolapse with flail segment of posterior leaflet and severe mitral regurgitation   . Nonspecific abnormal electrocardiogram (ECG) (EKG)    Pt states he had a cardiac workup and passed all tests (Echo and Stress)  . Pneumonia 1967  . Pulmonary hypertension (HCC) 05/12/2014  . Rash, skin Dec 2015   near buttocks; PCP aware   . 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 S/P mitral valve repair 05/25/2014   Complex valvuloplasty including triangular resection of flail segment of posterior leaflet, artificial Goretex neochord placement x2 and 28 mm Sorin Memo 3D  Rechord ring annuloplasty   . Shortness of breath dyspnea    with ambulation  . Tricuspid regurgitation 05/02/2014   Moderate TR by transthoracic ECHO  . Umbilical hernia    Past Surgical History:  Procedure Laterality Date  . CARDIAC CATHETERIZATION    . CATARACT EXTRACTION W/PHACO Left 03/01/2014   Procedure: CATARACT EXTRACTION PHACO AND INTRAOCULAR LENS PLACEMENT (IOC);  Surgeon: Chalmers Guest, MD;  Location: [facility] OR;  Service: Ophthalmology;  Laterality: Left;  . COLONOSCOPY    . CORONARY ARTERY BYPASS GRAFT N/A 05/25/2014   Procedure: CORONARY ARTERY BYPASS GRAFTING (CABG)TIMES 2 USING LEFT INTERNAL MAMMARY AND LEFT SAPHENOUS VEIN HARVESTED ENDOSCOPICALLY;  Surgeon: Purcell Nails, MD;  Location: [facility] OR;  Service: Open Heart Surgery;  Laterality: N/A;  . EYE SURGERY Right 05/14/12   cataract removal with lens implant  . FACIAL FRACTURE SURGERY  1970's  . LEFT AND RIGHT HEART CATHETERIZATION WITH CORONARY ANGIOGRAM N/A 05/12/2014   Procedure: LEFT AND RIGHT HEART CATHETERIZATION WITH CORONARY ANGIOGRAM;  Surgeon: Pamella Pert, MD;  Location: [facility] CATH LAB;  Service: Cardiovascular;  Laterality: N/A;  . MITRAL VALVE REPAIR N/A 05/25/2014   Procedure: MITRAL VALVE REPAIR (MVR) WITH CLOSURE OF PATENT FORAMEN OVALE;  Surgeon: Purcell Nails, MD;  Location: [facility] OR;  Service: Open Heart Surgery;  Laterality: N/A;  . TEE WITHOUT CARDIOVERSION N/A 05/11/2014   Procedure: TRANSESOPHAGEAL ECHOCARDIOGRAM (TEE);  Surgeon: Pamella Pert, MD;  Location: [facility] ENDOSCOPY;  Service: Cardiovascular;  Laterality: N/A;  . TEE WITHOUT CARDIOVERSION N/A 05/25/2014   Procedure: TRANSESOPHAGEAL ECHOCARDIOGRAM (TEE);  Surgeon: Rexene Alberts, MD;  Location: [place];  Service: Open Heart Surgery;  Laterality: N/A;  . TONSILLECTOMY    . TRABECULECTOMY Left 03/01/2014   Procedure: TRABECULECTOMY;  Surgeon: Marylynn Pearson, MD;  Location: [place];  Service: Ophthalmology;  Laterality: Left;   Family History  Problem  Relation Age of Onset  . Cancer Mother        breast  . Alcohol abuse Father   . Cataracts Father   . Heart disease Father        angina  . Arthritis Sister     Social History   Tobacco Use  . Smoking status: Former Smoker    Packs/day: 1.00    Years: 50.00    Pack years: 50.00    Quit date: 03/01/1983    Years since quitting: 36.4  . Smokeless tobacco: Never Used  Substance Use Topics  . Alcohol use: Yes    Alcohol/week: 21.0 standard drinks    Types: 21 Standard drinks or equivalent per week    Comment: 2 - 4 glasses of scotch daily   ROS  Review of Systems  Cardiovascular: Positive for leg swelling. Negative for chest pain and dyspnea on exertion.  Gastrointestinal: Negative for melena.   Objective  There were no vitals taken for this visit.  Vitals with BMI 01/14/2019 11/09/2018 05/28/2015  Height 6\' 0"  6\' 1"  6\' 1"   Weight 183 lbs 191 lbs 2 oz 215 lbs  BMI 24.81 60.73 71.0  Systolic 626 948 546  Diastolic 72 63 58  Pulse 43 44 68     Physical Exam  Constitutional: He appears well-developed and well-nourished. No distress.  Cardiovascular: Normal rate and normal heart sounds. An irregularly irregular rhythm present. Frequent extrasystoles are present. Exam reveals no gallop.  No murmur heard. Right lower except he 2+ pitting edema below the knee, left leg trace edema.  Normal carotids, normal femoral and popliteal artery, DP 1-2+ bilateral.  PT could not be felt.  Pulmonary/Chest: Effort normal and breath sounds normal.  Abdominal: Soft. Bowel sounds are normal.   Laboratory examination:   Recent Labs    12/31/18 1002  NA 140  K 4.7  CL 101  CO2 26  GLUCOSE 105*  BUN 13  CREATININE 1.06  CALCIUM 9.6  GFRNONAA 65  GFRAA 76   CrCl cannot be calculated (Patient's most recent lab result is older than the maximum 21 days allowed.).  CMP Latest Ref Rng & Units 12/31/2018 06/02/2014 05/31/2014  Glucose 65 - 99 mg/dL 105(H) 114(H) 120(H)  BUN 8 - 27 mg/dL 13 21  18   Creatinine 0.76 - 1.27 mg/dL 1.06 1.15 1.11  Sodium 134 - 144 mmol/L 140 135 137  Potassium 3.5 - 5.2 mmol/L 4.7 4.5 3.9  Chloride 96 - 106 mmol/L 101 98 100  CO2 20 - 29 mmol/L 26 30 30   Calcium 8.6 - 10.2 mg/dL 9.6 8.5 8.1(L)  Total Protein 6.0 - 8.5 g/dL 6.5 - -  Total Bilirubin 0.0 - 1.2 mg/dL 1.6(H) - -  Alkaline Phos 39 - 117 IU/L 116 - -  AST 0 - 40 IU/L 27 - -  ALT 0 - 44 IU/L 28 - -   CBC Latest Ref Rng & Units 12/31/2018 05/31/2014 05/30/2014  WBC 3.4 - 10.8 x10E3/uL 6.5 9.9 11.1(H)  Hemoglobin 13.0 - 17.7 g/dL 13.8 8.3(L) 8.3(L)  Hematocrit 37.5 -  51.0 % 41.1 25.2(L) 25.2(L)  Platelets 150 - 450 x10E3/uL 181 140(L) 117(L)   Lipid Panel     Component Value Date/Time   CHOL 105 12/31/2018 1002   TRIG 73 12/31/2018 1002   HDL 35 (L) 12/31/2018 1002   LDLCALC 55 12/31/2018 1002   HEMOGLOBIN A1C Lab Results  Component Value Date   HGBA1C 6.1 (H) 05/23/2014   MPG 128 (H) 05/23/2014   TSH Recent Labs    12/31/18 1002  TSH 2.360   External labs 12/31/2018:   Cholesterol, total 105.000 12/31/2018 HDL 35.000 12/31/2018 LDL 55.000 12/31/2018 Triglycerides 73.000 12/31/2018  Hemoglobin 13.800 G/ 12/31/2018, Platelets 181.000 12/31/2018, INR 2.300 07/15/2019  Creatinine, Serum 1.060 12/31/2018 Potassium 4.700 12/31/2018 ALT (SGPT) 28.000 12/31/2018 TSH 2.360 12/31/2018  Medications and allergies   Allergies  Allergen Reactions  . Restoril [Temazepam] Other (See Comments)    BAD DREAMS     Current Outpatient Medications  Medication Instructions  . metoprolol succinate (TOPROL-XL) 25 MG 24 hr tablet TAKE 1 TABLET EVERY DAY  . simvastatin (ZOCOR) 40 MG tablet TAKE 1 TABLET EVERY EVENING  AFTER  DINNER  . VITAMIN D PO Oral, Daily  . warfarin (COUMADIN) 3 MG tablet TAKE 1 TABLET DAILY, 4MG  ON MONDAY, TUESDAY, WEDNESDAY, THURSDAY, SATURDAY AND TAKE 3MG  FRIDAY AND SUNDAY .  warfarin (COUMADIN) 4 mg, Oral, One time only - 1800, One tablet M, T, W, Th, Sat. Take 3mg   tab Fri and Sun as directed    Radiology:   No results found.  Cardiac Studies:    Echo- 05/22/2016 1. Left ventricle cavity is normal in size. Mild decrease in global wall motion. Visual EF is 45-50%. Diastolic function can't be assessed properly due to mitral annulus calcification, valve repair ring and mitral stenosis Calculated EF 51%. 2. Left atrial cavity is mildly dilated. 3. Trace aortic regurgitation. Mild aortic valve leaflet calcification. Mildly restricted aortic valve leaflets. No evidence of aortic valve stenosis. 4. Trace mitral regurgitation. Moderate calcification of the mitral valve annulus. Echoes of mitral valve repair ring are seen. Mild mitral valve stenosis, mean mitral gradient 3 mm of Hg, calculated valve area 1.4 cm2. 5. Mild to moderate tricuspid regurgitation. Mild to moderate pulmonary hypertension with approx. PA syst. pressure of 42 mm of Hg. 6. No diagnostic change c.f. echo. of 06/21/2014.  CABG and mitral valve repair on 05/25/2014 with triangular resection of posterior leaflet, artificial Gore-Tex neochord placement 2, Sorin Memo 3D 28 mm annuloplasty ring placement, LIMA to LAD and SVG to RCA by Dr. 05/24/2016.  EKG 01/14/2019: Atrial fibrillation with controlled ventricular response at the rate of 64 bpm, normal axis, incomplete right bundle branch block.  Frequent PVCs in the form of ventricular bigeminy.  Diffuse global nonspecific ST depressions, consider digitalis effect.  Consider ischemia.  Normal QT interval.  EKG 01/29/2018: Sinus bradycardia with first-degree AV block, at 55 bpm.  Frequent PVCs, pattern of ventricular bigeminy.  Nonspecific ST abnormality.  No significant change from EKG 06/19/2017, 12/22/2016  Assessment     ICD-10-CM   1. Paroxysmal atrial fibrillation (HCC)  I48.0   2. Coronary artery disease involving native coronary artery of native heart without angina pectoris  I25.10   3. Frequent PVCs  I49.3   4. Stage 3a chronic  kidney disease  N18.31    No orders of the defined types were placed in this encounter.   There are no discontinued medications.   Recommendations:   Trevor Aguilar  is a  84 y.o. Caucasian male patient  with CAD S/P CABG and mitral repair on 05/25/2014 with placement of a 28 mm annuloplasty ring along with LIMA to LAD and SVG to RCA by Dr. Tressie Stalker. PAF, chronic frequent PVCs and bradycardia that is asymptomatic and chronic leg edema. Past medical history significant for hypertension, hyperlipidemia, stage III chronic kidney disease.  ***  Trevor Decamp, MD, [facility] 08/02/2019, 2:52 PM [facility] [place] Office: [phone]

## 2019-08-05 ENCOUNTER — Encounter: Payer: Self-pay | Admitting: Cardiology

## 2019-08-05 ENCOUNTER — Ambulatory Visit: Payer: Medicare HMO | Admitting: Cardiology

## 2019-08-05 ENCOUNTER — Other Ambulatory Visit: Payer: Self-pay

## 2019-08-05 VITALS — BP 130/63 | HR 38 | Temp 96.9°F | Resp 16 | Ht 72.0 in | Wt 178.1 lb

## 2019-08-05 DIAGNOSIS — E7800 Pure hypercholesterolemia, unspecified: Secondary | ICD-10-CM

## 2019-08-05 DIAGNOSIS — I493 Ventricular premature depolarization: Secondary | ICD-10-CM

## 2019-08-05 DIAGNOSIS — I480 Paroxysmal atrial fibrillation: Secondary | ICD-10-CM

## 2019-08-05 DIAGNOSIS — I2510 Atherosclerotic heart disease of native coronary artery without angina pectoris: Secondary | ICD-10-CM

## 2019-08-05 DIAGNOSIS — Z9889 Other specified postprocedural states: Secondary | ICD-10-CM

## 2019-08-05 MED ORDER — WARFARIN SODIUM 3 MG PO TABS: 3.0000 mg | ORAL_TABLET | Freq: Every day | ORAL | 2 refills | Status: DC

## 2019-08-05 NOTE — Progress Notes (Signed)
Primary Physician/Referring:  Vicenta Aly, FNP  Patient ID: Trevor Aguilar, male    DOB: 21-Apr-1936, 84 y.o.   MRN: [id number]  Chief Complaint  Patient presents with  . Coronary Artery Disease  . Paroxysmal Atrial Fibrillation    6 month follow up   HPI:    Trevor Aguilar  is a 84 y.o. Caucasian male patient  with CAD S/P CABG and mitral repair on 05/25/2014 with placement of a 28 mm annuloplasty ring along with LIMA to LAD and SVG to RCA by Dr. Darylene Price. PAF, chronic frequent PVCs and bradycardia that is asymptomatic and chronic leg edema. Past medical history significant for hypertension, hyperlipidemia, stage III chronic kidney disease.  He is presently doing well, tolerating all his medications well and he has not had any dyspnea, PND or orthopnea, he has minimal leg edema that is stable.  No bleeding diathesis on warfarin.  Past Medical History:  Diagnosis Date  . Acute on chronic diastolic congestive heart failure ([place]) 05/18/2014  . Aortic valve sclerosis 05/11/2014   Aortic valve sclerosis w/out stenosis  . Bilateral cataracts   . Chronic diastolic congestive heart failure ([place]) 05/18/2014  . Coronary artery disease 05/12/2014   High grade proximal LAD stenosis and moderate RCA stenosis  . Glaucoma   . Hearing loss    bilateral hearing aids  . Heart murmur   . High cholesterol   . History of skull fracture 1978   lower skull  . Hypercholesteremia   . Hypertension   . Mitral regurgitation 05/12/2014   Mitral valve prolapse with flail segment of posterior leaflet and severe mitral regurgitation   . Mitral regurgitation 05/10/2014   Mitral valve prolapse with flail segment of posterior leaflet and severe mitral regurgitation   . Nonspecific abnormal electrocardiogram (ECG) (EKG)    Pt states he had a cardiac workup and passed all tests (Echo and Stress)  . Pneumonia 1967  . Pulmonary hypertension ([place]) 05/12/2014  . Rash, skin Dec 2015   near buttocks; PCP  aware   . 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 Shortness of breath dyspnea    with ambulation  . Tricuspid regurgitation 05/02/2014   Moderate TR by transthoracic ECHO  . Umbilical hernia    Past Surgical History:  Procedure Laterality Date  . CARDIAC CATHETERIZATION    . CATARACT EXTRACTION W/PHACO Left 03/01/2014   Procedure: CATARACT EXTRACTION PHACO AND INTRAOCULAR LENS PLACEMENT (IOC);  Surgeon: Marylynn Pearson, MD;  Location: [place];  Service: Ophthalmology;  Laterality: Left;  . COLONOSCOPY    . CORONARY ARTERY BYPASS GRAFT N/A 05/25/2014   Procedure: CORONARY ARTERY BYPASS GRAFTING (CABG)TIMES 2 USING LEFT INTERNAL MAMMARY AND LEFT SAPHENOUS VEIN HARVESTED ENDOSCOPICALLY;  Surgeon: Rexene Alberts, MD;  Location: [place];  Service: Open Heart Surgery;  Laterality: N/A;  . EYE SURGERY Right 05/14/12   cataract removal with lens implant  . FACIAL FRACTURE SURGERY  1970's  . LEFT AND RIGHT HEART CATHETERIZATION WITH CORONARY ANGIOGRAM N/A 05/12/2014   Procedure: LEFT AND RIGHT HEART CATHETERIZATION WITH CORONARY ANGIOGRAM;  Surgeon: Laverda Page, MD;  Location[facility];  Service: Cardiovascular;  Laterality: N/A;  . MITRAL VALVE REPAIR N/A 05/25/2014  Procedure: MITRAL VALVE REPAIR (MVR) WITH CLOSURE OF PATENT FORAMEN OVALE;  Surgeon: Rexene Alberts, MD;  Location: [place];  Service: Open Heart Surgery;  Laterality: N/A;  . TEE WITHOUT CARDIOVERSION N/A 05/11/2014   Procedure: TRANSESOPHAGEAL ECHOCARDIOGRAM (TEE);  Surgeon: Laverda Page, MD;  Location: [place];  Service: Cardiovascular;  Laterality: N/A;  . TEE WITHOUT CARDIOVERSION N/A 05/25/2014   Procedure: TRANSESOPHAGEAL ECHOCARDIOGRAM (TEE);  Surgeon:  Rexene Alberts, MD;  Location: [place];  Service: Open Heart Surgery;  Laterality: N/A;  . TONSILLECTOMY    . TRABECULECTOMY Left 03/01/2014   Procedure: TRABECULECTOMY;  Surgeon: Marylynn Pearson, MD;  Location: [place];  Service: Ophthalmology;  Laterality: Left;   Family History  Problem Relation Age of Onset  . Cancer Mother        breast  . Alcohol abuse Father   . Cataracts Father   . Heart disease Father        angina  . Arthritis Sister     Social History   Tobacco Use  . Smoking status: Former Smoker    Packs/day: 1.00    Years: 50.00    Pack years: 50.00    Quit date: 03/01/1983    Years since quitting: 36.4  . Smokeless tobacco: Never Used  Substance Use Topics  . Alcohol use: Yes    Alcohol/week: 21.0 standard drinks    Types: 21 Standard drinks or equivalent per week    Comment: 2 - 4 glasses of scotch daily   ROS   Review of Systems  Cardiovascular: Positive for leg swelling. Negative for chest pain and dyspnea on exertion.  Gastrointestinal: Negative for melena.     Objective  Blood pressure 130/63, pulse (!) 38, temperature (!) 96.9 F (36.1 C), temperature source Temporal, resp. rate 16, height 6' (1.829 m), weight 178 lb 1.6 oz (80.8 kg), SpO2 97 %.  Vitals with BMI 08/05/2019 01/14/2019 11/09/2018  Height 6' 0" 6' 0" 6' 1"  Weight 178 lbs 2 oz 183 lbs 191 lbs 2 oz  BMI 24.15 64.33 29.51  Systolic 884 166 063  Diastolic 63 72 63  Pulse 38 43 44      Physical Exam  Constitutional: He appears well-developed and well-nourished. No distress.  Cardiovascular: Normal rate and normal heart sounds. An irregularly irregular rhythm present. Frequent extrasystoles are present. Exam reveals no gallop.  No murmur heard. Right lower extremity  2+ pitting ankle edema, left leg trace edema.  Normal carotids, normal femoral and popliteal artery, DP 1-2+ bilateral.  PT could not be felt.  Pulmonary/Chest: Effort normal and breath sounds normal.  Abdominal: Soft. Bowel sounds  are normal.     Laboratory examination:   Recent Labs    12/31/18 1002  NA 140  K 4.7  CL 101  CO2 26  GLUCOSE 105*  BUN 13  CREATININE 1.06  CALCIUM 9.6  GFRNONAA 65  GFRAA 76    Recent Labs    12/31/18 1002  TSH 2.360   External labs 12/31/2018:   Cholesterol, total 105.000 12/31/2018 HDL 35.000 12/31/2018 LDL 55.000 12/31/2018 Triglycerides 73.000 12/31/2018  Hemoglobin 13.800 G/ 12/31/2018, Platelets 181.000 12/31/2018, INR 2.300 07/15/2019  Creatinine, Serum 1.060 12/31/2018 Potassium 4.700 12/31/2018 ALT (SGPT) 28.000 12/31/2018 TSH 2.360 12/31/2018  Medications and allergies   Allergies  Allergen Reactions  . Restoril [Temazepam] Other (See Comments)    BAD DREAMS     Current Outpatient Medications  Medication Instructions  . metoprolol succinate (TOPROL-XL) 25 MG 24  hr tablet TAKE 1 TABLET EVERY DAY  . simvastatin (ZOCOR) 40 MG tablet TAKE 1 TABLET EVERY EVENING  AFTER  DINNER  . VITAMIN D PO Oral, Daily  . warfarin (COUMADIN) 3 MG tablet TAKE 1 TABLET DAILY, 4MG ON MONDAY, TUESDAY, WEDNESDAY, THURSDAY, SATURDAY AND TAKE 3MG FRIDAY AND SUNDAY .   Radiology:   No results found.  Cardiac Studies:    Echo- 05/22/2016 1. Left ventricle cavity is normal in size. Mild decrease in global wall motion. Visual EF is 45-50%. Diastolic function can't be assessed properly due to mitral annulus calcification, valve repair ring and mitral stenosis Calculated EF 51%. 2. Left atrial cavity is mildly dilated. 3. Trace aortic regurgitation. Mild aortic valve leaflet calcification. Mildly restricted aortic valve leaflets. No evidence of aortic valve stenosis. 4. Trace mitral regurgitation. Moderate calcification of the mitral valve annulus. Echoes of mitral valve repair ring are seen. Mild mitral valve stenosis, mean mitral gradient 3 mm of Hg, calculated valve area 1.4 cm2. 5. Mild to moderate tricuspid regurgitation. Mild to moderate pulmonary hypertension with approx. PA  syst. pressure of 42 mm of Hg. 6. No diagnostic change c.f. echo. of 06/21/2014.  CABG and mitral valve repair on 05/25/2014 with triangular resection of posterior leaflet, artificial Gore-Tex neochord placement 2, Sorin Memo 3D 28 mm annuloplasty ring placement, LIMA to LAD and SVG to RCA by Dr. Lilly Cove.  EKG 08/05/2019: Sinus rhythm with first-degree AV block at rate of 78 bpm, PR interval 300 ms.  Incomplete right bundle branch block.  Frequent PVCs in bigeminal pattern.  No ST-T wave changes of ischemia.   No significant change from  EKG 01/14/2019, EKG 01/29/2018  Assessment     ICD-10-CM   1. Coronary artery disease involving native coronary artery of native heart without angina pectoris  I25.10 EKG 12-Lead    TSH    CBC    CMP14+EGFR  2. S/P MVR (mitral valve repair)  Z98.890   3. Paroxysmal atrial fibrillation (HCC)  I48.0   4. Frequent PVCs  I49.3   5. Hypercholesteremia  E78.00 Lipid Panel With LDL/HDL Ratio   No orders of the defined types were placed in this encounter.   Medications Discontinued During This Encounter  Medication Reason  . warfarin (COUMADIN) 4 MG tablet Error   Recommendations:   Trevor Aguilar  is a  84 y.o. Caucasian male patient  with CAD S/P CABG and mitral repair on 05/25/2014 with placement of a 28 mm annuloplasty ring along with LIMA to LAD and SVG to RCA by Dr. Darylene Price. PAF, chronic frequent PVCs and bradycardia that is asymptomatic and chronic leg edema. Past medical history significant for hypertension, hyperlipidemia, stage III chronic kidney disease.  He remains asymptomatic, no change in his physical exam, reviewed his labs, his labs need to be updated, which I will order today.  His medications were reviewed, again discussed effects of warfarin and bleeding diathesis.  I will see him back on an annual basis.  No change in his PVCs, no clinical evidence of heart failure and no recurrence of angina pectoris.  Adrian Prows, MD,  [facility] 08/05/2019, 11:18 AM [facility] [place] Office: [phone]

## 2019-08-12 ENCOUNTER — Other Ambulatory Visit: Payer: Self-pay

## 2019-08-12 ENCOUNTER — Ambulatory Visit: Payer: Medicare HMO | Admitting: Cardiology

## 2019-08-12 DIAGNOSIS — I480 Paroxysmal atrial fibrillation: Secondary | ICD-10-CM

## 2019-08-12 LAB — POCT INR: INR: 2.1 (ref 2.0–3.0)

## 2019-08-12 NOTE — Progress Notes (Signed)
INR goal:  2.0-3.0  TTR:  50.2 % (1 y)  INR used for dosing:  2.1 (08/12/2019)  Warfarin maintenance plan:  3 mg (3 mg x 1) every day  Weekly warfarin total:  21 mg  Plan last modified:  Andrew Au, CMA (08/12/2019)  Next INR check:  09/12/2019  Target end date:  Indefinite   INR within range of 2-3 for A fib. Will continue with current regimen. Repeat in 1 month.

## 2019-08-16 ENCOUNTER — Other Ambulatory Visit: Payer: Self-pay | Admitting: Cardiology

## 2019-08-18 LAB — TSH: TSH: 2.6 u[IU]/mL (ref 0.450–4.500)

## 2019-08-18 LAB — CBC
Hematocrit: 43.1 % (ref 37.5–51.0)
Hemoglobin: 14.2 g/dL (ref 13.0–17.7)
MCH: 32.5 pg (ref 26.6–33.0)
MCHC: 32.9 g/dL (ref 31.5–35.7)
MCV: 99 fL — ABNORMAL HIGH (ref 79–97)
Platelets: 183 10*3/uL (ref 150–450)
RBC: 4.37 x10E6/uL (ref 4.14–5.80)
RDW: 12.8 % (ref 11.6–15.4)
WBC: 6.8 10*3/uL (ref 3.4–10.8)

## 2019-08-18 LAB — LIPID PANEL WITH LDL/HDL RATIO
Cholesterol, Total: 131 mg/dL (ref 100–199)
HDL: 38 mg/dL — ABNORMAL LOW (ref >39)
LDL Chol Calc (NIH): 76 mg/dL (ref 0–99)
LDL/HDL Ratio: 2 ratio (ref 0.0–3.6)
Triglycerides: 86 mg/dL (ref 0–149)
VLDL Cholesterol Cal: 17 mg/dL (ref 5–40)

## 2019-08-18 LAB — CMP14+EGFR
ALT: 25 IU/L (ref 0–44)
AST: 27 IU/L (ref 0–40)
Albumin/Globulin Ratio: 1.5 (ref 1.2–2.2)
Albumin: 4.2 g/dL (ref 3.6–4.6)
Alkaline Phosphatase: 106 IU/L (ref 39–117)
BUN/Creatinine Ratio: 14 (ref 10–24)
BUN: 16 mg/dL (ref 8–27)
Bilirubin Total: 1.4 mg/dL — ABNORMAL HIGH (ref 0.0–1.2)
CO2: 26 mmol/L (ref 20–29)
Calcium: 9.4 mg/dL (ref 8.6–10.2)
Chloride: 101 mmol/L (ref 96–106)
Creatinine, Ser: 1.18 mg/dL (ref 0.76–1.27)
GFR calc Af Amer: 66 mL/min/{1.73_m2} (ref >59)
GFR calc non Af Amer: 57 mL/min/{1.73_m2} — ABNORMAL LOW (ref >59)
Globulin, Total: 2.8 g/dL (ref 1.5–4.5)
Glucose: 104 mg/dL — ABNORMAL HIGH (ref 65–99)
Potassium: 4.3 mmol/L (ref 3.5–5.2)
Sodium: 139 mmol/L (ref 134–144)
Total Protein: 7 g/dL (ref 6.0–8.5)

## 2019-08-31 ENCOUNTER — Ambulatory Visit: Payer: Medicare HMO | Admitting: Podiatry

## 2019-08-31 ENCOUNTER — Other Ambulatory Visit: Payer: Self-pay

## 2019-08-31 DIAGNOSIS — L989 Disorder of the skin and subcutaneous tissue, unspecified: Secondary | ICD-10-CM

## 2019-08-31 DIAGNOSIS — M79676 Pain in unspecified toe(s): Secondary | ICD-10-CM

## 2019-08-31 DIAGNOSIS — B351 Tinea unguium: Secondary | ICD-10-CM | POA: Diagnosis not present

## 2019-09-04 NOTE — Progress Notes (Signed)
Subjective: Patient is a 84 y.o. male presenting to the office today with a chief complaint of painful callus lesion(s) noted to the bilateral feet that have been present for the past 5 years. He reports increased pain with ambulation. He has not done anything at home for treatment.  Patient also complains of elongated, thickened nails that cause pain while ambulating in shoes. He is unable to trim his own nails. Patient presents today for further treatment and evaluation.  Past Medical History:  Diagnosis Date  . Acute on chronic diastolic congestive heart failure (HCC) 05/18/2014  . Aortic valve sclerosis 05/11/2014   Aortic valve sclerosis w/out stenosis  . Bilateral cataracts   . Chronic diastolic congestive heart failure (HCC) 05/18/2014  . Coronary artery disease 05/12/2014   High grade proximal LAD stenosis and moderate RCA stenosis  . Glaucoma   . Hearing loss    bilateral hearing aids  . Heart murmur   . High cholesterol   . History of skull fracture 1978   lower skull  . Hypercholesteremia   . Hypertension   . Mitral regurgitation 05/12/2014   Mitral valve prolapse with flail segment of posterior leaflet and severe mitral regurgitation   . Mitral regurgitation 05/10/2014   Mitral valve prolapse with flail segment of posterior leaflet and severe mitral regurgitation   . Nonspecific abnormal electrocardiogram (ECG) (EKG)    Pt states he had a cardiac workup and passed all tests (Echo and Stress)  . Pneumonia 1967  . Pulmonary hypertension (HCC) 05/12/2014  . Rash, skin Dec 2015   near buttocks; PCP aware   . 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 Shortness of breath dyspnea    with ambulation  . Tricuspid  regurgitation 05/02/2014   Moderate TR by transthoracic ECHO  . Umbilical hernia     Objective:  Physical Exam General: Alert and oriented x3 in no acute distress  Dermatology: Hyperkeratotic lesion(s) present on the bilateral sub-fifth MPJs. Pain on palpation with a central nucleated core noted. Skin is warm, dry and supple bilateral lower extremities. Negative for open lesions or macerations. Nails are tender, long, thickened and dystrophic with subungual debris, consistent with onychomycosis, 1-5 bilateral. No signs of infection noted.  Vascular: Palpable pedal pulses bilaterally. No edema or erythema noted. Capillary refill within normal limits.  Neurological: Epicritic and protective threshold grossly intact bilaterally.   Musculoskeletal Exam: Pain on palpation at the keratotic lesion(s) noted. Range of motion within normal limits bilateral. Muscle strength 5/5 in all groups bilateral.  Assessment: 1. Onychodystrophic nails 1-5 bilateral with hyperkeratosis of nails.  2. Onychomycosis of nail due to dermatophyte bilateral 3. Pre-ulcerative callus lesions noted to the bilateral sub-fifth MPJs   Plan of Care:  1. Patient evaluated. 2. Excisional debridement of keratoic lesion(s) using a chisel blade was performed without incident.  3. Dressed with light dressing. 4. Mechanical debridement of nails 1-5 bilaterally performed using a nail nipper. Filed with dremel without incident.  5. Patient is to return to the clinic in 3 months.   Daughter's name is Reenee (pronounced REE-NEE).   Felecia Shelling, DPM [facility]  Dr. Felecia Shelling, DPM    [address]  [place], [place] [address]                Office [phone]  Fax [phone]

## 2019-09-12 ENCOUNTER — Ambulatory Visit: Payer: Medicare HMO | Admitting: Pharmacist

## 2019-09-12 ENCOUNTER — Other Ambulatory Visit: Payer: Self-pay

## 2019-09-12 DIAGNOSIS — I480 Paroxysmal atrial fibrillation: Secondary | ICD-10-CM

## 2019-09-12 LAB — POCT INR: INR: 1.8 — AB (ref 2.0–3.0)

## 2019-09-12 NOTE — Progress Notes (Signed)
Anticoagulation Management Trevor Aguilar is a 84 y.o. male who reports to the clinic for monitoring of warfarin treatment.    Indication: atrial fibrillation CHA2DS2 Vasc Score 4 (Age>72, CHF hx, vasculardisease), HAS-BLED 1 (Age >65)  Duration: indefinite Supervising physician: Yates Decamp  Anticoagulation Clinic Visit History:  Patient does not report signs/symptoms of bleeding or thromboembolism. Pt inquiring about DOAC eligibility. Previously hesitant due to cost reasons. Transport to clinic also a concerns since pt's daughter has been taking time off to be able to drive the pt to his INR visits. Had struggled with coordinating transport with [employer] previously. May consider eliquis 5 mg BID or xeralto 20 mg daily. Will print out patient assistance forms and discuss with Dr. Jacinto Halim prior to next coumadin visit.   Other recent changes: No changes in diet, medications, lifestyle  Anticoagulation Episode Summary    Current INR goal:  2.0-3.0  TTR:  48.9 % (1.1 y)  Next INR check:  09/26/2019  INR from last check:  1.8 (09/12/2019)  Weekly max warfarin dose:    Target end date:  Indefinite  INR check location:    Preferred lab:    Send INR reminders to:     Indications   Atrial fibrillation (HCC) [I48.91]       Comments:          Allergies  Allergen Reactions  . Restoril [Temazepam] Other (See Comments)    BAD DREAMS    Current Outpatient Medications:  .  metoprolol succinate (TOPROL-XL) 25 MG 24 hr tablet, TAKE 1 TABLET EVERY DAY, Disp: 90 tablet, Rfl: 3 .  simvastatin (ZOCOR) 40 MG tablet, TAKE 1 TABLET EVERY EVENING  AFTER  DINNER, Disp: 90 tablet, Rfl: 2 .  VITAMIN D PO, Take by mouth daily., Disp: , Rfl:  .  warfarin (COUMADIN) 3 MG tablet, Take 1 tablet (3 mg total) by mouth daily at 6 PM. Take as directed, Disp: 90 tablet, Rfl: 2 Past Medical History:  Diagnosis Date  . Acute on chronic diastolic congestive heart failure (HCC) 05/18/2014  . Aortic valve sclerosis  05/11/2014   Aortic valve sclerosis w/out stenosis  . Bilateral cataracts   . Chronic diastolic congestive heart failure (HCC) 05/18/2014  . Coronary artery disease 05/12/2014   High grade proximal LAD stenosis and moderate RCA stenosis  . Glaucoma   . Hearing loss    bilateral hearing aids  . Heart murmur   . High cholesterol   . History of skull fracture 1978   lower skull  . Hypercholesteremia   . Hypertension   . Mitral regurgitation 05/12/2014   Mitral valve prolapse with flail segment of posterior leaflet and severe mitral regurgitation   . Mitral regurgitation 05/10/2014   Mitral valve prolapse with flail segment of posterior leaflet and severe mitral regurgitation   . Nonspecific abnormal electrocardiogram (ECG) (EKG)    Pt states he had a cardiac workup and passed all tests (Echo and Stress)  . Pneumonia 1967  . Pulmonary hypertension (HCC) 05/12/2014  . Rash, skin Dec 2015   near buttocks; PCP aware   . 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 Shortness of breath dyspnea    with ambulation  . Tricuspid regurgitation 05/02/2014  Moderate TR by transthoracic ECHO  . Umbilical hernia     ASSESSMENT  Recent Results: The most recent result is correlated with 21 mg per week:  Lab Results  Component Value Date   INR 1.8 (A) 09/12/2019   INR 2.1 08/12/2019   INR 2.3 07/15/2019    Anticoagulation Dosing: Description   INR below goal. Take 4.5 mg today and then continue with 3 mg everyday and will return in two weeks.       INR today: Subtherapeutic. Pt has been previously therapeutic on current dose. INR has been trending down over the past two checks. Denies any recent changes that would explain the  subtherapeutic INR reading today. Will boost today and reassess in 2 weeks. If low next week as well, would warrant dose increase. Pt was previously supratheraputic on  4 mg Tue/Thur and 3 mg all other days. May consider adding another day of 4 mg. Will continue to follow up closely   PLAN Weekly dose was unchanged. Take 4.5 mg today and then continue with 3 mg everyday. Recheck INR in 2 weeks   Patient Instructions  INR below goal. Take 4.5 mg today and then continue with 3 mg everyday and will return in two weeks.   Patient advised to contact clinic or seek medical attention if signs/symptoms of bleeding or thromboembolism occur.  Patient verbalized understanding by repeating back information and was advised to contact me if further medication-related questions arise.   Follow-up Return in about 2 weeks (around 09/26/2019).  Alysia Penna, PharmD  15 minutes spent face-to-face with the patient during the encounter. 50% of time spent on education, including signs/sx bleeding and clotting, as well as food and drug interactions with warfarin. 50% of time was spent on fingerprick POC INR sample collection,processing, results determination, and documentation

## 2019-09-12 NOTE — Patient Instructions (Signed)
INR below goal. Take 4.5 mg today and then continue with 3 mg everyday and will return in two weeks.

## 2019-09-26 ENCOUNTER — Ambulatory Visit: Payer: Medicare HMO | Admitting: Pharmacist

## 2019-09-26 ENCOUNTER — Other Ambulatory Visit: Payer: Self-pay

## 2019-09-26 DIAGNOSIS — I480 Paroxysmal atrial fibrillation: Secondary | ICD-10-CM

## 2019-09-26 LAB — POCT INR: INR: 1.4 — AB (ref 2.0–3.0)

## 2019-09-26 NOTE — Patient Instructions (Addendum)
INR below goal. Take 6 mg today, then increase dose to 4 mg every Monday and Wednesday and 3 mg all other days. Recheck INR in 10 days

## 2019-09-26 NOTE — Progress Notes (Signed)
Anticoagulation Management Trevor Aguilar is a 84 y.o. male who reports to the clinic for monitoring of warfarin treatment.    Indication: atrial fibrillation CHA2DS2 Vasc Score 4 (Age>72, CHF hx, vasculardisease), HAS-BLED 1 (Age >65)  Duration: indefinite Supervising physician: Yates Decamp  Anticoagulation Clinic Visit History:  Patient does not report signs/symptoms of bleeding or thromboembolism. Reviewed DOAC financial eligiblity together. Pt would need to spend >3% of his annual income on out of pocket medication cost in order to qualify. Pt and daughter believes that he has not meet that limit yet. Pt and daughter would like to proceed with warfarin for now and may consider DOAC in the future.   Other recent changes: No changes in diet, medications, lifestyle  Anticoagulation Episode Summary    Current INR goal:  2.0-3.0  TTR:  47.2 % (1.1 y)  Next INR check:  10/06/2019  INR from last check:  1.4 (09/26/2019)  Weekly max warfarin dose:    Target end date:  Indefinite  INR check location:    Preferred lab:    Send INR reminders to:     Indications   Atrial fibrillation (HCC) [I48.91]       Comments:          Allergies  Allergen Reactions  . Restoril [Temazepam] Other (See Comments)    BAD DREAMS    Current Outpatient Medications:  .  metoprolol succinate (TOPROL-XL) 25 MG 24 hr tablet, TAKE 1 TABLET EVERY DAY, Disp: 90 tablet, Rfl: 3 .  simvastatin (ZOCOR) 40 MG tablet, TAKE 1 TABLET EVERY EVENING  AFTER  DINNER, Disp: 90 tablet, Rfl: 2 .  VITAMIN D PO, Take by mouth daily., Disp: , Rfl:  .  warfarin (COUMADIN) 3 MG tablet, Take 1 tablet (3 mg total) by mouth daily at 6 PM. Take as directed, Disp: 90 tablet, Rfl: 2 Past Medical History:  Diagnosis Date  . Acute on chronic diastolic congestive heart failure (HCC) 05/18/2014  . Aortic valve sclerosis 05/11/2014   Aortic valve sclerosis w/out stenosis  . Bilateral cataracts   . Chronic diastolic congestive heart  failure (HCC) 05/18/2014  . Coronary artery disease 05/12/2014   High grade proximal LAD stenosis and moderate RCA stenosis  . Glaucoma   . Hearing loss    bilateral hearing aids  . Heart murmur   . High cholesterol   . History of skull fracture 1978   lower skull  . Hypercholesteremia   . Hypertension   . Mitral regurgitation 05/12/2014   Mitral valve prolapse with flail segment of posterior leaflet and severe mitral regurgitation   . Mitral regurgitation 05/10/2014   Mitral valve prolapse with flail segment of posterior leaflet and severe mitral regurgitation   . Nonspecific abnormal electrocardiogram (ECG) (EKG)    Pt states he had a cardiac workup and passed all tests (Echo and Stress)  . Pneumonia 1967  . Pulmonary hypertension (HCC) 05/12/2014  . Rash, skin Dec 2015   near buttocks; PCP aware   . 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 Shortness of breath dyspnea    with ambulation  . Tricuspid regurgitation 05/02/2014   Moderate TR by transthoracic ECHO  . Umbilical hernia     ASSESSMENT  Recent Results: The most recent result is correlated with 21 mg per week:  Lab Results  Component Value Date   INR 1.4 (A) 09/26/2019   INR 1.8 (A) 09/12/2019   INR 2.1 08/12/2019    Anticoagulation Dosing: Description   INR below goal. Take 6 mg today, then increase dose to 4 mg every Monday and Wednesday and 3 mg all other days. Recheck INR in 10 days      INR today: Subtherapeutic. INR continues to trend down. Boost dose last visit with no improvement. No relevant changes in diet, medication, lifestyle that would explain the subtherapeutic INR readings. Warrants dose increase. Pt states that he still has 4 mg tablets  let over at home. Will boost today and then increase weekly dose by 9.5%.   PLAN Weekly dose was increased by 9.5%. Take 6 mg today and then increase weekly dose to 4 mg every Mon and Wed and 3 mg all other days. Recheck INR in 10 days.    Patient Instructions  INR below goal. Take 6 mg today, then increase dose to 4 mg every Monday and Wednesday and 3 mg all other days. Recheck INR in 8 days  Patient advised to contact clinic or seek medical attention if signs/symptoms of bleeding or thromboembolism occur.  Patient verbalized understanding by repeating back information and was advised to contact me if further medication-related questions arise.   Follow-up Return in about 10 days (around 10/06/2019).  Alysia Penna, PharmD  15 minutes spent face-to-face with the patient during the encounter. 50% of time spent on education, including signs/sx bleeding and clotting, as well as food and drug interactions with warfarin. 50% of time was spent on fingerprick POC INR sample collection,processing, results determination, and documentation

## 2019-10-06 ENCOUNTER — Other Ambulatory Visit: Payer: Self-pay

## 2019-10-06 ENCOUNTER — Ambulatory Visit: Payer: Medicare HMO | Admitting: Pharmacist

## 2019-10-06 DIAGNOSIS — I480 Paroxysmal atrial fibrillation: Secondary | ICD-10-CM

## 2019-10-06 LAB — POCT INR: INR: 1.8 — AB (ref 2.0–3.0)

## 2019-10-06 NOTE — Patient Instructions (Addendum)
INR below goal. Take 6 mg today, then increase dose to 4 mg every Monday, Wednesday, Friday and 3 mg all other days. Recheck INR in 12 days

## 2019-10-06 NOTE — Progress Notes (Signed)
Anticoagulation Management Trevor Aguilar is a 84 y.o. male who reports to the clinic for monitoring of warfarin treatment.    Indication: atrial fibrillation CHA2DS2 Vasc Score 4 (Age>72, CHF hx, vasculardisease), HAS-BLED 1 (Age >65)  Duration: indefinite Supervising physician: Adrian Prows  Anticoagulation Clinic Visit History:  Patient does not report signs/symptoms of bleeding or thromboembolism. Reviewed DOAC financial eligiblity together. Pt would need to spend >3% of his annual income on out of pocket medication cost in order to qualify. Pt and daughter believes that he has not meet that limit yet. Pt and daughter would like to proceed with warfarin for now and may consider DOAC in the future.   Other recent changes: No changes in diet, medications, lifestyle. Pt normally eats canned green beans, asparagus, green peas. Reports no changes in the green intake over the past week. Pt aware to stay consistent with his green intake. Has started doing leg exercises to improve his leg strength and gait.   Anticoagulation Episode Summary    Current INR goal:  2.0-3.0  TTR:  46.1 % (1.1 y)  Next INR check:  10/18/2019  INR from last check:  1.8 (10/06/2019)  Weekly max warfarin dose:    Target end date:  Indefinite  INR check location:    Preferred lab:    Send INR reminders to:     Indications   Atrial fibrillation ([place]) [I48.91]       Comments:          Allergies  Allergen Reactions  . Restoril [Temazepam] Other (See Comments)    BAD DREAMS    Current Outpatient Medications:  .  metoprolol succinate (TOPROL-XL) 25 MG 24 hr tablet, TAKE 1 TABLET EVERY DAY, Disp: 90 tablet, Rfl: 3 .  simvastatin (ZOCOR) 40 MG tablet, TAKE 1 TABLET EVERY EVENING  AFTER  DINNER, Disp: 90 tablet, Rfl: 2 .  VITAMIN D PO, Take by mouth daily., Disp: , Rfl:  .  warfarin (COUMADIN) 3 MG tablet, Take 1 tablet (3 mg total) by mouth daily at 6 PM. Take as directed, Disp: 90 tablet, Rfl: 2 Past Medical  History:  Diagnosis Date  . Acute on chronic diastolic congestive heart failure ([place]) 05/18/2014  . Aortic valve sclerosis 05/11/2014   Aortic valve sclerosis w/out stenosis  . Bilateral cataracts   . Chronic diastolic congestive heart failure ([place]) 05/18/2014  . Coronary artery disease 05/12/2014   High grade proximal LAD stenosis and moderate RCA stenosis  . Glaucoma   . Hearing loss    bilateral hearing aids  . Heart murmur   . High cholesterol   . History of skull fracture 1978   lower skull  . Hypercholesteremia   . Hypertension   . Mitral regurgitation 05/12/2014   Mitral valve prolapse with flail segment of posterior leaflet and severe mitral regurgitation   . Mitral regurgitation 05/10/2014   Mitral valve prolapse with flail segment of posterior leaflet and severe mitral regurgitation   . Nonspecific abnormal electrocardiogram (ECG) (EKG)    Pt states he had a cardiac workup and passed all tests (Echo and Stress)  . Pneumonia 1967  . Pulmonary hypertension ([place]) 05/12/2014  . Rash, skin Dec 2015   near buttocks; PCP aware   . 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 S/P mitral valve repair 05/25/2014   Complex valvuloplasty including triangular resection of flail segment of posterior leaflet, artificial Goretex neochord placement x2 and  28 mm Sorin Memo 3D Rechord ring annuloplasty   . Shortness of breath dyspnea    with ambulation  . Tricuspid regurgitation 05/02/2014   Moderate TR by transthoracic ECHO  . Umbilical hernia     ASSESSMENT  Recent Results: The most recent result is correlated with 23 mg per week:  Lab Results  Component Value Date   INR 1.8 (A) 10/06/2019   INR 1.4 (A) 09/26/2019   INR 1.8 (A) 09/12/2019    Anticoagulation Dosing: Description   INR below goal. Take 6 mg today, then increase dose to 4 mg every Monday, Wednesday, Friday and 3 mg all other days. Recheck INR in 12 days      INR today:  Subtherapeutic. INR trended up slightly after dose increase last check. INR still remains low. Denies any complains of stroke like symptoms. Denies any complains of bleeding complications. Pt has previous hx of labile INR reading and unsure of the underlying cause. Will continue with dose increase and close monitoring to ensure that pt remains within the therapeutic range. Pt states that he still has 4 mg tablets let over at home. Will boost today and then increase weekly dose by 4.3%.   PLAN Weekly dose was increased by 4.3%. Take 6 mg today and then increase weekly dose to 4 mg every Mon, Wed, Fri and 3 mg all other days. Recheck INR in 12 days.    Patient Instructions  INR below goal. Take 6 mg today, then increase dose to 4 mg every Monday, Wednesday, Friday and 3 mg all other days. Recheck INR in 12 days   Patient advised to contact clinic or seek medical attention if signs/symptoms of bleeding or thromboembolism occur.  Patient verbalized understanding by repeating back information and was advised to contact me if further medication-related questions arise.   Follow-up Return in about 12 days (around 10/18/2019).  Leonides Schanz, PharmD  15 minutes spent face-to-face with the patient during the encounter. 50% of time spent on education, including signs/sx bleeding and clotting, as well as food and drug interactions with warfarin. 50% of time was spent on fingerprick POC INR sample collection,processing, results determination, and documentation

## 2019-10-21 ENCOUNTER — Ambulatory Visit: Payer: Medicare HMO | Admitting: Pharmacist

## 2019-10-21 ENCOUNTER — Other Ambulatory Visit: Payer: Self-pay

## 2019-10-21 DIAGNOSIS — I480 Paroxysmal atrial fibrillation: Secondary | ICD-10-CM

## 2019-10-21 LAB — POCT INR: INR: 2.3 (ref 2.0–3.0)

## 2019-10-21 NOTE — Progress Notes (Signed)
Anticoagulation Management Trevor Aguilar is a 84 y.o. male who reports to the clinic for monitoring of warfarin treatment.    Indication: atrial fibrillation CHA2DS2 Vasc Score 4 (Age>72, CHF hx, vasculardisease), HAS-BLED 1 (Age >65)  Duration: indefinite Supervising physician: Yates Decamp  Anticoagulation Clinic Visit History:  Patient does not report signs/symptoms of bleeding or thromboembolism.   Other recent changes: No changes in diet, medications, lifestyle. Pt normally eats canned green beans, asparagus, green peas. Reports no changes in the green intake over the past week. Pt aware to stay consistent with his green intake. Has started doing leg exercises to improve his leg strength and gait.   Anticoagulation Episode Summary    Current INR goal:  2.0-3.0  TTR:  46.6 % (1.2 y)  Next INR check:  10/31/2019  INR from last check:  2.3 (10/21/2019)  Weekly max warfarin dose:    Target end date:  Indefinite  INR check location:    Preferred lab:    Send INR reminders to:     Indications   Atrial fibrillation (HCC) [I48.91]       Comments:          Allergies  Allergen Reactions  . Restoril [Temazepam] Other (See Comments)    BAD DREAMS    Current Outpatient Medications:  .  metoprolol succinate (TOPROL-XL) 25 MG 24 hr tablet, TAKE 1 TABLET EVERY DAY, Disp: 90 tablet, Rfl: 3 .  simvastatin (ZOCOR) 40 MG tablet, TAKE 1 TABLET EVERY EVENING  AFTER  DINNER, Disp: 90 tablet, Rfl: 2 .  VITAMIN D PO, Take by mouth daily., Disp: , Rfl:  .  warfarin (COUMADIN) 3 MG tablet, Take 1 tablet (3 mg total) by mouth daily at 6 PM. Take as directed, Disp: 90 tablet, Rfl: 2 Past Medical History:  Diagnosis Date  . Acute on chronic diastolic congestive heart failure (HCC) 05/18/2014  . Aortic valve sclerosis 05/11/2014   Aortic valve sclerosis w/out stenosis  . Bilateral cataracts   . Chronic diastolic congestive heart failure (HCC) 05/18/2014  . Coronary artery disease 05/12/2014   High grade proximal LAD stenosis and moderate RCA stenosis  . Glaucoma   . Hearing loss    bilateral hearing aids  . Heart murmur   . High cholesterol   . History of skull fracture 1978   lower skull  . Hypercholesteremia   . Hypertension   . Mitral regurgitation 05/12/2014   Mitral valve prolapse with flail segment of posterior leaflet and severe mitral regurgitation   . Mitral regurgitation 05/10/2014   Mitral valve prolapse with flail segment of posterior leaflet and severe mitral regurgitation   . Nonspecific abnormal electrocardiogram (ECG) (EKG)    Pt states he had a cardiac workup and passed all tests (Echo and Stress)  . Pneumonia 1967  . Pulmonary hypertension (HCC) 05/12/2014  . Rash, skin Dec 2015   near buttocks; PCP aware   . 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 Shortness of breath dyspnea    with ambulation  . Tricuspid regurgitation 05/02/2014   Moderate TR by transthoracic ECHO  . Umbilical hernia     ASSESSMENT  Recent Results: The most recent result is correlated with 24mg  per week:  Lab  Results  Component Value Date   INR 2.3 10/21/2019   INR 1.8 (A) 10/06/2019   INR 1.4 (A) 09/26/2019    Anticoagulation Dosing: Description   INR at goal. Continue taking 4 mg every Monday, Wednesday, Friday and 3 mg all other days. Recheck INR in 10 days      INR today: Therapeutic. Boosted and weekly dose increased last check. INR therapeutic today. Pt has had labile INR recently. Emphasized to the pt about remaining consistent with his green intake and his warfarin regimen in order to ensure pt remains therapeutic on warfarin. Will continue current dose and close monitoring to ensure INR remains within  therapeutic range.   PLAN Weekly dose was unchanged. Continue current weekly dose of 4 mg every Mon, Wed, Fri and 3 mg all other days. Recheck INR in 10 days.    Patient Instructions  INR at goal. Continue taking 4 mg every Monday, Wednesday, Friday and 3 mg all other days. Recheck INR in 10 days  Patient advised to contact clinic or seek medical attention if signs/symptoms of bleeding or thromboembolism occur.  Patient verbalized understanding by repeating back information and was advised to contact me if further medication-related questions arise.   Follow-up Return in about 10 days (around 10/31/2019).  Alysia Penna, PharmD  15 minutes spent face-to-face with the patient during the encounter. 50% of time spent on education, including signs/sx bleeding and clotting, as well as food and drug interactions with warfarin. 50% of time was spent on fingerprick POC INR sample collection,processing, results determination, and documentation

## 2019-10-21 NOTE — Patient Instructions (Signed)
INR at goal. Continue taking 4 mg every Monday, Wednesday, Friday and 3 mg all other days. Recheck INR in 10 days

## 2019-10-31 ENCOUNTER — Other Ambulatory Visit: Payer: Self-pay | Admitting: Cardiology

## 2019-10-31 ENCOUNTER — Ambulatory Visit: Payer: Medicare HMO | Admitting: Pharmacist

## 2019-10-31 ENCOUNTER — Other Ambulatory Visit: Payer: Self-pay

## 2019-10-31 DIAGNOSIS — I480 Paroxysmal atrial fibrillation: Secondary | ICD-10-CM

## 2019-10-31 LAB — POCT INR
INR: 1.9 — AB (ref 2.0–3.0)
INR: 1.9 — AB (ref 2.0–3.0)

## 2019-10-31 NOTE — Progress Notes (Signed)
Anticoagulation Management Trevor Aguilar is a 84 y.o. male who reports to the clinic for monitoring of warfarin treatment.    Indication: atrial fibrillation CHA2DS2 Vasc Score 4 (Age>72, CHF hx, vasculardisease), HAS-BLED 1 (Age >65)  Duration: indefinite Supervising physician: Yates Decamp  Anticoagulation Clinic Visit History:  Patient does not report signs/symptoms of bleeding or thromboembolism.   Other recent changes: No changes in diet, medications, lifestyle. Pt normally eats canned green beans, asparagus, green peas.Reports having more Vit K intake over the past week than normal. Has been able to increase his exercise tolerance since last visit. Started walking on treadmill, using his stationary bicycle for ~30 mins/day everyday.   Anticoagulation Episode Summary    Current INR goal:  2.0-3.0  TTR:  47.2 % (1.2 y)  Next INR check:  11/10/2019  INR from last check:  1.9 (10/31/2019)  Weekly max warfarin dose:    Target end date:  Indefinite  INR check location:    Preferred lab:    Send INR reminders to:     Indications   Atrial fibrillation (HCC) [I48.91]       Comments:          Allergies  Allergen Reactions  . Restoril [Temazepam] Other (See Comments)    BAD DREAMS    Current Outpatient Medications:  .  metoprolol succinate (TOPROL-XL) 25 MG 24 hr tablet, TAKE 1 TABLET EVERY DAY, Disp: 90 tablet, Rfl: 3 .  simvastatin (ZOCOR) 40 MG tablet, TAKE 1 TABLET EVERY EVENING  AFTER  DINNER, Disp: 90 tablet, Rfl: 2 .  VITAMIN D PO, Take by mouth daily., Disp: , Rfl:  .  warfarin (COUMADIN) 3 MG tablet, Take 1 tablet (3 mg total) by mouth daily at 6 PM. Take as directed, Disp: 90 tablet, Rfl: 2 Past Medical History:  Diagnosis Date  . Acute on chronic diastolic congestive heart failure (HCC) 05/18/2014  . Aortic valve sclerosis 05/11/2014   Aortic valve sclerosis w/out stenosis  . Bilateral cataracts   . Chronic diastolic congestive heart failure (HCC) 05/18/2014  .  Coronary artery disease 05/12/2014   High grade proximal LAD stenosis and moderate RCA stenosis  . Glaucoma   . Hearing loss    bilateral hearing aids  . Heart murmur   . High cholesterol   . History of skull fracture 1978   lower skull  . Hypercholesteremia   . Hypertension   . Mitral regurgitation 05/12/2014   Mitral valve prolapse with flail segment of posterior leaflet and severe mitral regurgitation   . Mitral regurgitation 05/10/2014   Mitral valve prolapse with flail segment of posterior leaflet and severe mitral regurgitation   . Nonspecific abnormal electrocardiogram (ECG) (EKG)    Pt states he had a cardiac workup and passed all tests (Echo and Stress)  . Pneumonia 1967  . Pulmonary hypertension (HCC) 05/12/2014  . Rash, skin Dec 2015   near buttocks; PCP aware   . 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 Shortness of breath dyspnea    with ambulation  . Tricuspid regurgitation 05/02/2014   Moderate TR by transthoracic ECHO  . Umbilical hernia     ASSESSMENT  Recent Results: The most recent result is correlated with  per  week:  Lab Results  Component Value Date   INR 1.9 (A) 10/31/2019   INR 1.9 (A) 10/31/2019   INR 2.3 10/21/2019    Anticoagulation Dosing: Description   INR below goal. Take 6 mg today and then continue taking 4 mg every Monday, Wednesday, Friday and 3 mg all other days. Recheck INR in 10 days      INR today: Subtherapeutic. Likely in the setting of increased VitK intake than norm. Reviewed Vit K intake education sheet and the importance of staying consistent with the pt and daughter. Daughter and husband planing on going to the grocery store together and only buying produce that  are in the "low" or "Vitk-free" food category. Asked pt to keep a food diary and to be able to review it together at next INR check.   PLAN Weekly dose was unchanged. Take 6 mg today and then continue current weekly dose of 4 mg every Mon, Wed, Fri and 3 mg all other days. Recheck INR in 10 days.    Patient Instructions  INSTRUCTIONS: INR below goal. Take 6 mg today and then continue taking 4 mg every Monday, Wednesday, Friday and 3 mg all other days. Recheck INR in 10 days  Warfarin tablets What is this medicine? WARFARIN (WAR far in) is an anticoagulant. It is used to treat or prevent clots in the veins, arteries, lungs, or heart. This medicine may be used for other purposes; ask your health care provider or pharmacist if you have questions. COMMON BRAND NAME(S): Coumadin, Jantoven What should I tell my health care provider before I take this medicine? They need to know if you have any of these conditions:  alcoholism  anemia  bleeding disorders  cancer  diabetes  heart disease  high blood pressure  history of bleeding in the gastrointestinal tract  history of stroke or other brain injury or disease  kidney or liver disease  protein C deficiency  protein S deficiency  psychosis or dementia  recent injury, recent or planned surgery or procedure  an unusual or allergic reaction to warfarin, other medicines, foods, dyes, or preservatives  pregnant or trying to get pregnant  breast-feeding How should I use this medicine? Take this medicine by mouth with a glass of water. Follow the directions on the prescription label. You can take this medicine with or without food. Take your medicine at the same time each day. Do not take it more often than directed. Do not stop taking except on your doctor's advice. Stopping this medicine may increase your risk of a blood clot. Be sure to refill your prescription before you run out of medicine. If your doctor or healthcare  professional calls to change your dose, write down the dose and any other instructions. Always read the dose and instructions back to him or her to make sure you understand them. Tell your doctor or healthcare professional what strength of tablets you have on hand. Ask how many tablets you should take to equal your new dose. Write the date on the new instructions and keep them near your medicine. If you are told to stop taking your medicine until your next blood test, call your doctor or healthcare professional if you do not hear anything within 24 hours of the test to find out your new dose or when to restart your prior dose. A special MedGuide will be given to you by the pharmacist with each prescription and refill. Be sure to read this information carefully each time. Talk  to your pediatrician regarding the use of this medicine in children. Special care may be needed. Overdosage: If you think you have taken too much of this medicine contact a poison control center or emergency room at once. NOTE: This medicine is only for you. Do not share this medicine with others. What if I miss a dose? It is important not to miss a dose. If you miss a dose, call your healthcare provider. Take the dose as soon as possible on the same day. If it is almost time for your next dose, take only that dose. Do not take double or extra doses to make up for a missed dose. What may interact with this medicine? Do not take this medicine with any of the following medications:  agents that prevent or dissolve blood clots  aspirin or other salicylates  danshen  dextrothyroxine  mifepristone  St. John's Wort  red yeast rice This medicine may also interact with the following medications:  acetaminophen  agents that lower cholesterol  alcohol  allopurinol  amiodarone  antibiotics or medicines for treating bacterial, fungal or viral infections  azathioprine  barbiturate medicines for inducing sleep or  treating seizures  certain medicines for diabetes  certain medicines for heart rhythm problems  certain medicines for hepatitis C virus infections like daclatasvir, dasabuvir; ombitasvir; paritaprevir; ritonavir, elbasvir; grazoprevir, ledipasvir; sofosbuvir, simeprevir, sofosbuvir, sofosbuvir; velpatasvir, sofosbuvir; velpatasvir; voxilaprevir  certain medicines for high blood pressure  chloral hydrate  cisapride  conivaptan  disulfiram  male hormones, including contraceptive or birth control pills  general anesthetics  herbal or dietary products like garlic, ginkgo, ginseng, green tea, or kava kava  influenza virus vaccine  male hormones  medicines for mental depression or psychosis  medicines for some types of cancer  medicines for stomach problems  methylphenidate  NSAIDs, medicines for pain and inflammation, like ibuprofen or naproxen  propoxyphene  quinidine, quinine  raloxifene  seizure or epilepsy medicine like carbamazepine, phenytoin, and valproic acid  steroids like cortisone and prednisone  tamoxifen  thyroid medicine  tramadol  vitamin c, vitamin e, and vitamin K  zafirlukast  zileuton This list may not describe all possible interactions. Give your health care provider a list of all the medicines, herbs, non-prescription drugs, or dietary supplements you use. Also tell them if you smoke, drink alcohol, or use illegal drugs. Some items may interact with your medicine. What should I watch for while using this medicine? Visit your healthcare professional for regular checks on your progress. You will need to have a blood test called a PT/INR regularly. The PT/INR blood test is done to make sure you are getting the right dose of this medicine. It is important to not miss your appointment for the blood tests. When you first start taking this medicine, these tests are done often. Once the correct dose is determined and you take your medicine  properly, these tests can be done less often. Wear a medical ID bracelet or chain, and carry a card that describes your disease and details of your medicine and dosage times. Do not start taking or stop taking any medicines or over-the-counter medicines except on the advice of your healthcare professional. You should discuss your diet with your healthcare professional. Do not make major changes in your diet. Vitamin K can affect how well this medicine works. Many foods contain vitamin K. It is important to eat a consistent amount of foods with vitamin K. Other foods with vitamin K that you should eat in consistent  amounts are asparagus, basil, black-eyed peas, broccoli, brussel sprouts, cabbage, green onions, green tea, parsley, green leafy vegetables like beet greens, collard greens, kale, spinach, turnip greens, or certain lettuces like green leaf or romaine. This medicine can cause birth defects or bleeding in an unborn child. Women of childbearing age should use effective birth control while taking this medicine. If a woman becomes pregnant while taking this medicine, she should discuss the potential risks and her options with her healthcare professional. Avoid sports and activities that might cause injury while you are using this medicine. Severe falls or injuries can cause unseen bleeding. Be careful when using sharp tools or knives. Consider using an Copy. Take special care brushing or flossing your teeth. Report any injuries, bruising, or red spots on the skin to your healthcare professional. If you have an illness that causes vomiting, diarrhea, or fever for more than a few days, contact your health care professional. Also, check with your healthcare professional if you are unable to eat for several days. These problems can change the effect of this medicine. Even after you stop taking this medicine, it takes several days before your body recovers its normal ability to clot blood. Ask your  healthcare professional how long you need to be careful. If you are going to have surgery or dental work, tell your health care professional that you have been taking this medicine. What side effects may I notice from receiving this medicine? Side effects that you should report to your doctor or health care professional as soon as possible:  allergic reactions like skin rash, itching or hives, swelling of the face, lips, or tongue  heavy menstrual bleeding or vaginal bleeding  painful, blue or purple toes  painful skin ulcers that do not go away  signs and symptoms of bleeding such as bloody or black, tarry stools; red or dark-brown urine; spitting up blood or brown material that looks like coffee grounds; red spots on the skin; unusual bruising or bleeding from the eye, gums, or nose  signs and symptoms of a blood clot such as chest pain; shortness of breath; pain, swelling, or warmth in the leg  signs and symptoms of a stroke such as changes in vision; confusion; trouble speaking or understanding; severe headaches; sudden numbness or weakness of the face, arm or leg; trouble walking; dizziness; loss of coordination  stomach pain  unusually weak or tired Side effects that usually do not require medical attention (report to your doctor or health care professional if they continue or are bothersome):  diarrhea  hair loss This list may not describe all possible side effects. Call your doctor for medical advice about side effects. You may report side effects to FDA at 1-800-FDA-1088. Where should I keep my medicine? Keep out of the reach of children. Store at room temperature between 15 and 30 degrees C (59 and 86 degrees F). Protect from light. Throw away any unused medicine after the expiration date. Do not flush down the toilet. NOTE: This sheet is a summary. It may not cover all possible information. If you have questions about this medicine, talk to your doctor, pharmacist, or health  care provider.  2020 Elsevier/Gold Standard (2017-03-11 13:06:45)   Vitamin K Foods and Warfarin Warfarin is a blood thinner (anticoagulant). Anticoagulant medicines help prevent the formation of blood clots. These medicines work by decreasing the activity of vitamin K, which promotes normal blood clotting. When you take warfarin, problems can occur from suddenly increasing or decreasing the  amount of vitamin K that you eat from one day to the next. Problems may include:  Blood clots.  Bleeding. What general guidelines do I need to follow? To avoid problems when taking warfarin:  Eat a balanced diet that includes: ? Fresh fruits and vegetables. ? Whole grains. ? Low-fat dairy products. ? Lean proteins, such as fish, eggs, and lean cuts of meat.  Keep your intake of vitamin K consistent from day to day. To do this: ? Avoid eating large amounts of vitamin K one day and low amounts of vitamin K the next day. ? If you take a multivitamin that contains vitamin K, be sure to take it every day. ? Know which foods contain vitamin K. Use the lists below to understand serving sizes and the amount of vitamin K in one serving.  Avoid major changes in your diet. If you are going to change your diet, talk with your health care provider before making changes.  Work with a Dealer (dietitian) to develop a meal plan that works best for you.  High vitamin K foods Foods that are high in vitamin K contain more than 100 mcg (micrograms) per serving. These include:  Broccoli (cooked) -  cup has 110 mcg.  Brussels sprouts (cooked) -  cup has 109 mcg.  Greens, beet (cooked) -  cup has 350 mcg.  Greens, collard (cooked) -  cup has 418 mcg.  Greens, turnip (cooked) -  cup has 265 mcg.  Green onions or scallions -  cup has 105 mcg.  Kale (fresh or frozen) -  cup has 531 mcg.  Parsley (raw) - 10 sprigs has 164 mcg.  Spinach (cooked) -  cup has 444 mcg.  Swiss chard  (cooked) -  cup has 287 mcg. Moderate vitamin K foods Foods that have a moderate amount of vitamin K contain 25-100 mcg per serving. These include:  Asparagus (cooked) - 5 spears have 38 mcg.  Black-eyed peas (dried) -  cup has 32 mcg.  Cabbage (cooked) -  cup has 37 mcg.  Kiwi fruit - 1 medium has 31 mcg.  Lettuce - 1 cup has 57-63 mcg.  Okra (frozen) -  cup has 44 mcg.  Prunes (dried) - 5 prunes have 25 mcg.  Watercress (raw) - 1 cup has 85 mcg. Low vitamin K foods Foods low in vitamin K contain less than 25 mcg per serving. These include:  Artichoke - 1 medium has 18 mcg.  Avocado - 1 oz. has 6 mcg.  Blueberries -  cup has 14 mcg.  Cabbage (raw) -  cup has 21 mcg.  Carrots (cooked) -  cup has 11 mcg.  Cauliflower (raw) -  cup has 11 mcg.  Cucumber with peel (raw) -  cup has 9 mcg.  Grapes -  cup has 12 mcg.  Mango - 1 medium has 9 mcg.  Nuts - 1 oz. has 15 mcg.  Pear - 1 medium has 8 mcg.  Peas (cooked) -  cup has 19 mcg.  Pickles - 1 spear has 14 mcg.  Pumpkin seeds - 1 oz. has 13 mcg.  Sauerkraut (canned) -  cup has 16 mcg.  Soybeans (cooked) -  cup has 16 mcg.  Tomato (raw) - 1 medium has 10 mcg.  Tomato sauce -  cup has 17 mcg. Vitamin K-free foods If a food contain less than 5 mcg per serving, it is considered to have no vitamin K. These foods include:  Bread and cereal products.  Cheese.  Eggs.  Fish and shellfish.  Meat and poultry.  Milk and dairy products.  Sunflower seeds. Actual amounts of vitamin K in foods may be different depending on processing. Talk with your dietitian about what foods you can eat and what foods you should avoid. This information is not intended to replace advice given to you by your health care provider. Make sure you discuss any questions you have with your health care provider. Document Revised: 05/08/2017 Document Reviewed: 08/29/2015 Elsevier Patient Education  2020 [employer].   Patient advised to contact clinic or seek medical attention if signs/symptoms of bleeding or thromboembolism occur.  Patient verbalized understanding by repeating back information and was advised to contact me if further medication-related questions arise.   Follow-up Return in about 10 days (around 11/10/2019).  Leonides Schanz, PharmD  15 minutes spent face-to-face with the patient during the encounter. 50% of time spent on education, including signs/sx bleeding and clotting, as well as food and drug interactions with warfarin. 50% of time was spent on fingerprick POC INR sample collection,processing, results determination, and documentationga

## 2019-10-31 NOTE — Patient Instructions (Addendum)
INSTRUCTIONS: INR below goal. Take 6 mg today and then continue taking 4 mg every Monday, Wednesday, Friday and 3 mg all other days. Recheck INR in 10 days  Warfarin tablets What is this medicine? WARFARIN (WAR far in) is an anticoagulant. It is used to treat or prevent clots in the veins, arteries, lungs, or heart. This medicine may be used for other purposes; ask your health care provider or pharmacist if you have questions. COMMON BRAND NAME(S): Coumadin, Jantoven What should I tell my health care provider before I take this medicine? They need to know if you have any of these conditions:  alcoholism  anemia  bleeding disorders  cancer  diabetes  heart disease  high blood pressure  history of bleeding in the gastrointestinal tract  history of stroke or other brain injury or disease  kidney or liver disease  protein C deficiency  protein S deficiency  psychosis or dementia  recent injury, recent or planned surgery or procedure  an unusual or allergic reaction to warfarin, other medicines, foods, dyes, or preservatives  pregnant or trying to get pregnant  breast-feeding How should I use this medicine? Take this medicine by mouth with a glass of water. Follow the directions on the prescription label. You can take this medicine with or without food. Take your medicine at the same time each day. Do not take it more often than directed. Do not stop taking except on your doctor's advice. Stopping this medicine may increase your risk of a blood clot. Be sure to refill your prescription before you run out of medicine. If your doctor or healthcare professional calls to change your dose, write down the dose and any other instructions. Always read the dose and instructions back to him or her to make sure you understand them. Tell your doctor or healthcare professional what strength of tablets you have on hand. Ask how many tablets you should take to equal your new dose. Write the  date on the new instructions and keep them near your medicine. If you are told to stop taking your medicine until your next blood test, call your doctor or healthcare professional if you do not hear anything within 24 hours of the test to find out your new dose or when to restart your prior dose. A special MedGuide will be given to you by the pharmacist with each prescription and refill. Be sure to read this information carefully each time. Talk to your pediatrician regarding the use of this medicine in children. Special care may be needed. Overdosage: If you think you have taken too much of this medicine contact a poison control center or emergency room at once. NOTE: This medicine is only for you. Do not share this medicine with others. What if I miss a dose? It is important not to miss a dose. If you miss a dose, call your healthcare provider. Take the dose as soon as possible on the same day. If it is almost time for your next dose, take only that dose. Do not take double or extra doses to make up for a missed dose. What may interact with this medicine? Do not take this medicine with any of the following medications:  agents that prevent or dissolve blood clots  aspirin or other salicylates  danshen  dextrothyroxine  mifepristone  St. John's Wort  red yeast rice This medicine may also interact with the following medications:  acetaminophen  agents that lower cholesterol  alcohol  allopurinol  amiodarone  antibiotics  or medicines for treating bacterial, fungal or viral infections  azathioprine  barbiturate medicines for inducing sleep or treating seizures  certain medicines for diabetes  certain medicines for heart rhythm problems  certain medicines for hepatitis C virus infections like daclatasvir, dasabuvir; ombitasvir; paritaprevir; ritonavir, elbasvir; grazoprevir, ledipasvir; sofosbuvir, simeprevir, sofosbuvir, sofosbuvir; velpatasvir, sofosbuvir; velpatasvir;  voxilaprevir  certain medicines for high blood pressure  chloral hydrate  cisapride  conivaptan  disulfiram  male hormones, including contraceptive or birth control pills  general anesthetics  herbal or dietary products like garlic, ginkgo, ginseng, green tea, or kava kava  influenza virus vaccine  male hormones  medicines for mental depression or psychosis  medicines for some types of cancer  medicines for stomach problems  methylphenidate  NSAIDs, medicines for pain and inflammation, like ibuprofen or naproxen  propoxyphene  quinidine, quinine  raloxifene  seizure or epilepsy medicine like carbamazepine, phenytoin, and valproic acid  steroids like cortisone and prednisone  tamoxifen  thyroid medicine  tramadol  vitamin c, vitamin e, and vitamin K  zafirlukast  zileuton This list may not describe all possible interactions. Give your health care provider a list of all the medicines, herbs, non-prescription drugs, or dietary supplements you use. Also tell them if you smoke, drink alcohol, or use illegal drugs. Some items may interact with your medicine. What should I watch for while using this medicine? Visit your healthcare professional for regular checks on your progress. You will need to have a blood test called a PT/INR regularly. The PT/INR blood test is done to make sure you are getting the right dose of this medicine. It is important to not miss your appointment for the blood tests. When you first start taking this medicine, these tests are done often. Once the correct dose is determined and you take your medicine properly, these tests can be done less often. Wear a medical ID bracelet or chain, and carry a card that describes your disease and details of your medicine and dosage times. Do not start taking or stop taking any medicines or over-the-counter medicines except on the advice of your healthcare professional. You should discuss your diet with  your healthcare professional. Do not make major changes in your diet. Vitamin K can affect how well this medicine works. Many foods contain vitamin K. It is important to eat a consistent amount of foods with vitamin K. Other foods with vitamin K that you should eat in consistent amounts are asparagus, basil, black-eyed peas, broccoli, brussel sprouts, cabbage, green onions, green tea, parsley, green leafy vegetables like beet greens, collard greens, kale, spinach, turnip greens, or certain lettuces like green leaf or romaine. This medicine can cause birth defects or bleeding in an unborn child. Women of childbearing age should use effective birth control while taking this medicine. If a woman becomes pregnant while taking this medicine, she should discuss the potential risks and her options with her healthcare professional. Avoid sports and activities that might cause injury while you are using this medicine. Severe falls or injuries can cause unseen bleeding. Be careful when using sharp tools or knives. Consider using an Neurosurgeon. Take special care brushing or flossing your teeth. Report any injuries, bruising, or red spots on the skin to your healthcare professional. If you have an illness that causes vomiting, diarrhea, or fever for more than a few days, contact your health care professional. Also, check with your healthcare professional if you are unable to eat for several days. These problems can change the  effect of this medicine. Even after you stop taking this medicine, it takes several days before your body recovers its normal ability to clot blood. Ask your healthcare professional how long you need to be careful. If you are going to have surgery or dental work, tell your health care professional that you have been taking this medicine. What side effects may I notice from receiving this medicine? Side effects that you should report to your doctor or health care professional as soon as  possible:  allergic reactions like skin rash, itching or hives, swelling of the face, lips, or tongue  heavy menstrual bleeding or vaginal bleeding  painful, blue or purple toes  painful skin ulcers that do not go away  signs and symptoms of bleeding such as bloody or black, tarry stools; red or dark-brown urine; spitting up blood or brown material that looks like coffee grounds; red spots on the skin; unusual bruising or bleeding from the eye, gums, or nose  signs and symptoms of a blood clot such as chest pain; shortness of breath; pain, swelling, or warmth in the leg  signs and symptoms of a stroke such as changes in vision; confusion; trouble speaking or understanding; severe headaches; sudden numbness or weakness of the face, arm or leg; trouble walking; dizziness; loss of coordination  stomach pain  unusually weak or tired Side effects that usually do not require medical attention (report to your doctor or health care professional if they continue or are bothersome):  diarrhea  hair loss This list may not describe all possible side effects. Call your doctor for medical advice about side effects. You may report side effects to FDA at 1-800-FDA-1088. Where should I keep my medicine? Keep out of the reach of children. Store at room temperature between 15 and 30 degrees C (59 and 86 degrees F). Protect from light. Throw away any unused medicine after the expiration date. Do not flush down the toilet. NOTE: This sheet is a summary. It may not cover all possible information. If you have questions about this medicine, talk to your doctor, pharmacist, or health care provider.  2020 Elsevier/Gold Standard (2017-03-11 13:06:45)   Vitamin K Foods and Warfarin Warfarin is a blood thinner (anticoagulant). Anticoagulant medicines help prevent the formation of blood clots. These medicines work by decreasing the activity of vitamin K, which promotes normal blood clotting. When you take  warfarin, problems can occur from suddenly increasing or decreasing the amount of vitamin K that you eat from one day to the next. Problems may include:  Blood clots.  Bleeding. What general guidelines do I need to follow? To avoid problems when taking warfarin:  Eat a balanced diet that includes: ? Fresh fruits and vegetables. ? Whole grains. ? Low-fat dairy products. ? Lean proteins, such as fish, eggs, and lean cuts of meat.  Keep your intake of vitamin K consistent from day to day. To do this: ? Avoid eating large amounts of vitamin K one day and low amounts of vitamin K the next day. ? If you take a multivitamin that contains vitamin K, be sure to take it every day. ? Know which foods contain vitamin K. Use the lists below to understand serving sizes and the amount of vitamin K in one serving.  Avoid major changes in your diet. If you are going to change your diet, talk with your health care provider before making changes.  Work with a Dealer (dietitian) to develop a meal plan that works best for  you.  High vitamin K foods Foods that are high in vitamin K contain more than 100 mcg (micrograms) per serving. These include:  Broccoli (cooked) -  cup has 110 mcg.  Brussels sprouts (cooked) -  cup has 109 mcg.  Greens, beet (cooked) -  cup has 350 mcg.  Greens, collard (cooked) -  cup has 418 mcg.  Greens, turnip (cooked) -  cup has 265 mcg.  Green onions or scallions -  cup has 105 mcg.  Kale (fresh or frozen) -  cup has 531 mcg.  Parsley (raw) - 10 sprigs has 164 mcg.  Spinach (cooked) -  cup has 444 mcg.  Swiss chard (cooked) -  cup has 287 mcg. Moderate vitamin K foods Foods that have a moderate amount of vitamin K contain 25-100 mcg per serving. These include:  Asparagus (cooked) - 5 spears have 38 mcg.  Black-eyed peas (dried) -  cup has 32 mcg.  Cabbage (cooked) -  cup has 37 mcg.  Kiwi fruit - 1 medium has 31 mcg.  Lettuce - 1  cup has 57-63 mcg.  Okra (frozen) -  cup has 44 mcg.  Prunes (dried) - 5 prunes have 25 mcg.  Watercress (raw) - 1 cup has 85 mcg. Low vitamin K foods Foods low in vitamin K contain less than 25 mcg per serving. These include:  Artichoke - 1 medium has 18 mcg.  Avocado - 1 oz. has 6 mcg.  Blueberries -  cup has 14 mcg.  Cabbage (raw) -  cup has 21 mcg.  Carrots (cooked) -  cup has 11 mcg.  Cauliflower (raw) -  cup has 11 mcg.  Cucumber with peel (raw) -  cup has 9 mcg.  Grapes -  cup has 12 mcg.  Mango - 1 medium has 9 mcg.  Nuts - 1 oz. has 15 mcg.  Pear - 1 medium has 8 mcg.  Peas (cooked) -  cup has 19 mcg.  Pickles - 1 spear has 14 mcg.  Pumpkin seeds - 1 oz. has 13 mcg.  Sauerkraut (canned) -  cup has 16 mcg.  Soybeans (cooked) -  cup has 16 mcg.  Tomato (raw) - 1 medium has 10 mcg.  Tomato sauce -  cup has 17 mcg. Vitamin K-free foods If a food contain less than 5 mcg per serving, it is considered to have no vitamin K. These foods include:  Bread and cereal products.  Cheese.  Eggs.  Fish and shellfish.  Meat and poultry.  Milk and dairy products.  Sunflower seeds. Actual amounts of vitamin K in foods may be different depending on processing. Talk with your dietitian about what foods you can eat and what foods you should avoid. This information is not intended to replace advice given to you by your health care provider. Make sure you discuss any questions you have with your health care provider. Document Revised: 05/08/2017 Document Reviewed: 08/29/2015 Elsevier Patient Education  2020 [employer].

## 2019-11-10 ENCOUNTER — Other Ambulatory Visit: Payer: Self-pay

## 2019-11-10 ENCOUNTER — Ambulatory Visit: Payer: Medicare HMO | Admitting: Pharmacist

## 2019-11-10 DIAGNOSIS — Z7901 Long term (current) use of anticoagulants: Secondary | ICD-10-CM

## 2019-11-10 DIAGNOSIS — Z5181 Encounter for therapeutic drug level monitoring: Secondary | ICD-10-CM

## 2019-11-10 DIAGNOSIS — I480 Paroxysmal atrial fibrillation: Secondary | ICD-10-CM

## 2019-11-10 LAB — POCT INR: INR: 2.4 (ref 2.0–3.0)

## 2019-11-10 NOTE — Patient Instructions (Signed)
INR at goal. Continue taking 4 mg every Monday, Wednesday, Friday and 3 mg all other days. Recheck INR in  2 weeks

## 2019-11-10 NOTE — Progress Notes (Signed)
Anticoagulation Management Trevor Aguilar is a 84 y.o. male who reports to the clinic for monitoring of warfarin treatment.    Indication: atrial fibrillation CHA2DS2 Vasc Score 4 (Age>72, CHF hx, vasculardisease), HAS-BLED 1 (Age >65)  Duration: indefinite Supervising physician: Yates Decamp  Anticoagulation Clinic Visit History:  Patient does not report signs/symptoms of bleeding or thromboembolism.   Other recent changes: No changes in diet, medications, lifestyle. Reviewed food diary together. Pt only had 1 serving of green beans last week. No other food with significant Vit K intake.  Anticoagulation Episode Summary    Current INR goal:  2.0-3.0  TTR:  47.9 % (1.2 y)  Next INR check:  11/24/2019  INR from last check:  2.4 (11/10/2019)  Weekly max warfarin dose:    Target end date:  Indefinite  INR check location:    Preferred lab:    Send INR reminders to:     Indications   Atrial fibrillation (HCC) [I48.91]       Comments:          Allergies  Allergen Reactions  . Restoril [Temazepam] Other (See Comments)    BAD DREAMS    Current Outpatient Medications:  .  metoprolol succinate (TOPROL-XL) 25 MG 24 hr tablet, TAKE 1 TABLET EVERY DAY, Disp: 90 tablet, Rfl: 3 .  simvastatin (ZOCOR) 40 MG tablet, TAKE 1 TABLET EVERY EVENING  AFTER  DINNER, Disp: 90 tablet, Rfl: 2 .  VITAMIN D PO, Take by mouth daily., Disp: , Rfl:  .  warfarin (COUMADIN) 3 MG tablet, Take 1 tablet (3 mg total) by mouth daily at 6 PM. Take as directed, Disp: 90 tablet, Rfl: 2 Past Medical History:  Diagnosis Date  . Acute on chronic diastolic congestive heart failure (HCC) 05/18/2014  . Aortic valve sclerosis 05/11/2014   Aortic valve sclerosis w/out stenosis  . Bilateral cataracts   . Chronic diastolic congestive heart failure (HCC) 05/18/2014  . Coronary artery disease 05/12/2014   High grade proximal LAD stenosis and moderate RCA stenosis  . Glaucoma   . Hearing loss    bilateral hearing aids  .  Heart murmur   . High cholesterol   . History of skull fracture 1978   lower skull  . Hypercholesteremia   . Hypertension   . Mitral regurgitation 05/12/2014   Mitral valve prolapse with flail segment of posterior leaflet and severe mitral regurgitation   . Mitral regurgitation 05/10/2014   Mitral valve prolapse with flail segment of posterior leaflet and severe mitral regurgitation   . Nonspecific abnormal electrocardiogram (ECG) (EKG)    Pt states he had a cardiac workup and passed all tests (Echo and Stress)  . Pneumonia 1967  . Pulmonary hypertension (HCC) 05/12/2014  . Rash, skin Dec 2015   near buttocks; PCP aware   . 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 Shortness of breath dyspnea    with ambulation  . Tricuspid regurgitation 05/02/2014   Moderate TR by transthoracic ECHO  . Umbilical hernia     ASSESSMENT  Recent Results: The most recent result is correlated with 24mg  per week:  Lab Results  Component Value Date   INR 2.4 11/10/2019   INR 1.9 (A) 10/31/2019   INR  1.9 (A) 10/31/2019    Anticoagulation Dosing: Description   INR at goal. Continue taking 4 mg every Monday, Wednesday, Friday and 3 mg all other days. Recheck INR in  2 weeks      INR today: Therapeutic. Returns to be therapeutic after boost dose last visit. Reports to return to his normal green intake. Pt normally eating frozen TV meals and trying to avoid excess green intake. Asked pt to continue keeping a food diary. Will continue close monitoring to review VitK intake and ensure INR remains within range.   PLAN Weekly dose was unchanged. Continue current weekly dose of 4 mg every Mon, Wed, Fri and 3 mg all other days. Recheck INR in 2  weeks.    Patient Instructions  INR at goal. Continue taking 4 mg every Monday, Wednesday, Friday and 3 mg all other days. Recheck INR in  2 weeks  Patient advised to contact clinic or seek medical attention if signs/symptoms of bleeding or thromboembolism occur.  Patient verbalized understanding by repeating back information and was advised to contact me if further medication-related questions arise.   Follow-up Return in about 2 weeks (around 11/24/2019).  Alysia Penna, PharmD  15 minutes spent face-to-face with the patient during the encounter. 50% of time spent on education, including signs/sx bleeding and clotting, as well as food and drug interactions with warfarin. 50% of time was spent on fingerprick POC INR sample collection,processing, results determination, and documentationga

## 2019-11-24 ENCOUNTER — Other Ambulatory Visit: Payer: Self-pay

## 2019-11-24 ENCOUNTER — Ambulatory Visit: Payer: Medicare HMO | Admitting: Pharmacist

## 2019-11-24 DIAGNOSIS — Z7901 Long term (current) use of anticoagulants: Secondary | ICD-10-CM | POA: Insufficient documentation

## 2019-11-24 DIAGNOSIS — I4891 Unspecified atrial fibrillation: Secondary | ICD-10-CM

## 2019-11-24 LAB — POCT INR: INR: 2.5 (ref 2.0–3.0)

## 2019-11-24 NOTE — Progress Notes (Signed)
Anticoagulation Management Trevor Aguilar is a 84 y.o. male who reports to the clinic for monitoring of warfarin treatment.    Indication: atrial fibrillation CHA2DS2 Vasc Score 4 (Age>72, CHF hx, vasculardisease), HAS-BLED 1 (Age >65)  Duration: indefinite Supervising physician: Yates Decamp  Anticoagulation Clinic Visit History:  Patient does not report signs/symptoms of bleeding or thromboembolism.   Other recent changes: No changes in diet, medications, lifestyle. Pt continues to eat TV dinners 1-2x/week. Otherwise cooking himself meatballs, spam, tuna.   Anticoagulation Episode Summary    Current INR goal:  2.0-3.0  TTR:  49.5 % (1.3 y)  Next INR check:  12/22/2019  INR from last check:  2.5 (11/24/2019)  Weekly max warfarin dose:    Target end date:  Indefinite  INR check location:    Preferred lab:    Send INR reminders to:     Indications   Atrial fibrillation (HCC) [I48.91] Monitoring for long-term anticoagulant use [Z51.81 Z79.01]       Comments:          Allergies  Allergen Reactions  . Restoril [Temazepam] Other (See Comments)    BAD DREAMS    Current Outpatient Medications:  .  metoprolol succinate (TOPROL-XL) 25 MG 24 hr tablet, TAKE 1 TABLET EVERY DAY, Disp: 90 tablet, Rfl: 3 .  simvastatin (ZOCOR) 40 MG tablet, TAKE 1 TABLET EVERY EVENING  AFTER  DINNER, Disp: 90 tablet, Rfl: 2 .  VITAMIN D PO, Take by mouth daily., Disp: , Rfl:  .  warfarin (COUMADIN) 3 MG tablet, Take 1 tablet (3 mg total) by mouth daily at 6 PM. Take as directed, Disp: 90 tablet, Rfl: 2 Past Medical History:  Diagnosis Date  . Acute on chronic diastolic congestive heart failure (HCC) 05/18/2014  . Aortic valve sclerosis 05/11/2014   Aortic valve sclerosis w/out stenosis  . Bilateral cataracts   . Chronic diastolic congestive heart failure (HCC) 05/18/2014  . Coronary artery disease 05/12/2014   High grade proximal LAD stenosis and moderate RCA stenosis  . Glaucoma   . Hearing loss     bilateral hearing aids  . Heart murmur   . High cholesterol   . History of skull fracture 1978   lower skull  . Hypercholesteremia   . Hypertension   . Mitral regurgitation 05/12/2014   Mitral valve prolapse with flail segment of posterior leaflet and severe mitral regurgitation   . Mitral regurgitation 05/10/2014   Mitral valve prolapse with flail segment of posterior leaflet and severe mitral regurgitation   . Nonspecific abnormal electrocardiogram (ECG) (EKG)    Pt states he had a cardiac workup and passed all tests (Echo and Stress)  . Pneumonia 1967  . Pulmonary hypertension (HCC) 05/12/2014  . Rash, skin Dec 2015   near buttocks; PCP aware   . 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 Shortness of breath dyspnea    with ambulation  . Tricuspid regurgitation 05/02/2014   Moderate TR by transthoracic ECHO  . Umbilical hernia     ASSESSMENT  Recent Results: The most recent result is correlated with 24 mg per week:  Lab Results  Component Value Date   INR 2.5 11/24/2019   INR 2.4 11/10/2019   INR 1.9 (  A) 10/31/2019   INR 1.9 (A) 10/31/2019    Anticoagulation Dosing: Description   INR at goal. Continue taking 4 mg every Monday, Wednesday, Friday and 3 mg all other days. Recheck INR in 4 weeks      INR today: Therapeutic. Pt continues to remain therapeutic on the current dose. Pt reports to being consistent with his diet since last check. No other relevant changes in diet, medications, or lifestyle.  Denies any bleeding or bruising episodes.   PLAN Weekly dose was unchanged. Continue current weekly dose of 4 mg every Mon, Wed, Fri and 3 mg all other days. Recheck INR in 4 weeks.    Patient Instructions  INR  at goal. Continue taking 4 mg every Monday, Wednesday, Friday and 3 mg all other days. Recheck INR in 4 weeks  Patient advised to contact clinic or seek medical attention if signs/symptoms of bleeding or thromboembolism occur.  Patient verbalized understanding by repeating back information and was advised to contact me if further medication-related questions arise.   Follow-up Return in about 4 weeks (around 12/22/2019).  Alysia Penna, PharmD  15 minutes spent face-to-face with the patient during the encounter. 50% of time spent on education, including signs/sx bleeding and clotting, as well as food and drug interactions with warfarin. 50% of time was spent on fingerprick POC INR sample collection,processing, results determination, and documentationga

## 2019-11-24 NOTE — Patient Instructions (Signed)
INR at goal. Continue taking 4 mg every Monday, Wednesday, Friday and 3 mg all other days. Recheck INR in 4 weeks 

## 2019-12-02 ENCOUNTER — Encounter: Payer: Self-pay | Admitting: Podiatry

## 2019-12-02 ENCOUNTER — Other Ambulatory Visit: Payer: Self-pay

## 2019-12-02 ENCOUNTER — Ambulatory Visit: Payer: Medicare HMO | Admitting: Podiatry

## 2019-12-02 DIAGNOSIS — L989 Disorder of the skin and subcutaneous tissue, unspecified: Secondary | ICD-10-CM

## 2019-12-02 DIAGNOSIS — M79676 Pain in unspecified toe(s): Secondary | ICD-10-CM | POA: Diagnosis not present

## 2019-12-02 DIAGNOSIS — B351 Tinea unguium: Secondary | ICD-10-CM

## 2019-12-02 NOTE — Progress Notes (Signed)
This patient returns to my office for at risk foot care.  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   Patient is taking coumadin.  This patient is unable to cut nails himself since the patient cannot reach his nails.These nails are painful walking and wearing shoes.  This patient presents for at risk foot care today.  General Appearance  Alert, conversant and in no acute stress.  Vascular  Dorsalis pedis and posterior tibial  pulses are palpable  bilaterally.  Capillary return is within normal limits  bilaterally. Temperature is within normal limits  bilaterally.  Neurologic  Senn-Weinstein monofilament wire test within normal limits  bilaterally. Muscle power within normal limits bilaterally.  Nails Thick disfigured discolored nails with subungual debris  from hallux to fifth toes bilaterally. No evidence of bacterial infection or drainage bilaterally.  Orthopedic  No limitations of motion  feet .  No crepitus or effusions noted.  No bony pathology or digital deformities noted.  Plantar flex fifth metatarsal  B/L.  Skin  normotropic skin with no porokeratosis noted bilaterally.  No signs of infections or ulcers noted.   Asymptomatic  Onychomycosis  Pain in right toes  Pain in left toes  Consent was obtained for treatment procedures.   Mechanical debridement of nails 1-5  bilaterally performed with a nail nipper.  Filed with dremel without incident.    Return office visit   3 months                   Told patient to return for periodic foot care and evaluation due to potential at risk complications.   Helane Gunther DPM

## 2019-12-22 ENCOUNTER — Other Ambulatory Visit: Payer: Self-pay

## 2019-12-22 ENCOUNTER — Ambulatory Visit: Payer: Medicare HMO | Admitting: Pharmacist

## 2019-12-22 DIAGNOSIS — Z5181 Encounter for therapeutic drug level monitoring: Secondary | ICD-10-CM

## 2019-12-22 DIAGNOSIS — I4891 Unspecified atrial fibrillation: Secondary | ICD-10-CM

## 2019-12-22 LAB — POCT INR: INR: 2.8 (ref 2.0–3.0)

## 2019-12-22 NOTE — Progress Notes (Signed)
Anticoagulation Management Trevor Aguilar is a 84 y.o. male who reports to the clinic for monitoring of warfarin treatment.    Indication: atrial fibrillation CHA2DS2 Vasc Score 4 (Age>72, CHF hx, vasculardisease), HAS-BLED 1 (Age >65)  Duration: indefinite Supervising physician: Yates Decamp  Anticoagulation Clinic Visit History:  Patient does not report signs/symptoms of bleeding or thromboembolism.   Other recent changes: No changes in diet, medications, lifestyle. Pt continues to eat TV dinners 1-2x/week. Otherwise cooking himself meatballs, spam, pork-chop, tuna.   Anticoagulation Episode Summary    Current INR goal:  2.0-3.0  TTR:  52.3 % (1.4 y)  Next INR check:  01/17/2020  INR from last check:  2.8 (12/22/2019)  Weekly max warfarin dose:    Target end date:  Indefinite  INR check location:    Preferred lab:    Send INR reminders to:     Indications   Atrial fibrillation (HCC) [I48.91] Monitoring for long-term anticoagulant use [Z51.81 Z79.01]       Comments:          Allergies  Allergen Reactions  . Restoril [Temazepam] Other (See Comments)    BAD DREAMS    Current Outpatient Medications:  .  metoprolol succinate (TOPROL-XL) 25 MG 24 hr tablet, TAKE 1 TABLET EVERY DAY, Disp: 90 tablet, Rfl: 3 .  simvastatin (ZOCOR) 40 MG tablet, TAKE 1 TABLET EVERY EVENING  AFTER  DINNER, Disp: 90 tablet, Rfl: 2 .  VITAMIN D PO, Take by mouth daily., Disp: , Rfl:  .  warfarin (COUMADIN) 3 MG tablet, Take 1 tablet (3 mg total) by mouth daily at 6 PM. Take as directed, Disp: 90 tablet, Rfl: 2 Past Medical History:  Diagnosis Date  . Acute on chronic diastolic congestive heart failure (HCC) 05/18/2014  . Aortic valve sclerosis 05/11/2014   Aortic valve sclerosis w/out stenosis  . Bilateral cataracts   . Chronic diastolic congestive heart failure (HCC) 05/18/2014  . Coronary artery disease 05/12/2014   High grade proximal LAD stenosis and moderate RCA stenosis  . Glaucoma   .  Hearing loss    bilateral hearing aids  . Heart murmur   . High cholesterol   . History of skull fracture 1978   lower skull  . Hypercholesteremia   . Hypertension   . Mitral regurgitation 05/12/2014   Mitral valve prolapse with flail segment of posterior leaflet and severe mitral regurgitation   . Mitral regurgitation 05/10/2014   Mitral valve prolapse with flail segment of posterior leaflet and severe mitral regurgitation   . Nonspecific abnormal electrocardiogram (ECG) (EKG)    Pt states he had a cardiac workup and passed all tests (Echo and Stress)  . Pneumonia 1967  . Pulmonary hypertension (HCC) 05/12/2014  . Rash, skin Dec 2015   near buttocks; PCP aware   . 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 Shortness of breath dyspnea    with ambulation  . Tricuspid regurgitation 05/02/2014   Moderate TR by transthoracic ECHO  . Umbilical hernia     ASSESSMENT  Recent Results: The most recent result is correlated with 24 mg per week:  Lab Results  Component Value Date   INR 2.8 12/22/2019   INR 2.5 11/24/2019   INR  2.4 11/10/2019    Anticoagulation Dosing: Description   INR at goal. Continue taking 4 mg every Monday, Wednesday, Friday and 3 mg all other days. Recheck INR in 4 weeks      INR today: Therapeutic. Pt continues to remain therapeutic on the current dose. Pt reports to being consistent with his diet since last check. No other relevant changes in diet, medications, or lifestyle.  Denies any bleeding or bruising episodes. Pt considering transitioning to Eliquis but is worried about the cost and isnt sure yet if he wants to pursue that option yet. Reviewed the pros and cons of warfarin vs Eliquis and offered  assistance with financial application if pt qualifies. Pt to consider and let the office know if he decides to proceed with the transition. Will need the INR <2 before transitioning to Eliquis.   PLAN Weekly dose was unchanged. Continue current weekly dose of 4 mg every Mon, Wed, Fri and 3 mg all other days. Recheck INR in 4 weeks.    Patient Instructions  INR at goal. Continue taking 4 mg every Monday, Wednesday, Friday and 3 mg all other days. Recheck INR in 4 weeks  Patient advised to contact clinic or seek medical attention if signs/symptoms of bleeding or thromboembolism occur.  Patient verbalized understanding by repeating back information and was advised to contact me if further medication-related questions arise.   Follow-up Return in about 26 days (around 01/17/2020).  Leonides Schanz, PharmD  15 minutes spent face-to-face with the patient during the encounter. 50% of time spent on education, including signs/sx bleeding and clotting, as well as food and drug interactions with warfarin. 50% of time was spent on fingerprick POC INR sample collection,processing, results determination, and documentationga

## 2019-12-22 NOTE — Patient Instructions (Signed)
INR at goal. Continue taking 4 mg every Monday, Wednesday, Friday and 3 mg all other days. Recheck INR in 4 weeks 

## 2020-01-18 ENCOUNTER — Other Ambulatory Visit: Payer: Self-pay

## 2020-01-18 ENCOUNTER — Ambulatory Visit: Payer: Medicare HMO | Admitting: Pharmacist

## 2020-01-18 DIAGNOSIS — I4891 Unspecified atrial fibrillation: Secondary | ICD-10-CM

## 2020-01-18 DIAGNOSIS — Z7901 Long term (current) use of anticoagulants: Secondary | ICD-10-CM

## 2020-01-18 LAB — POCT INR: INR: 2.4 (ref 2.0–3.0)

## 2020-01-18 NOTE — Patient Instructions (Signed)
INR at goal. Continue taking 4 mg every Monday, Wednesday, Friday and 3 mg all other days. Recheck INR in 4 weeks

## 2020-01-18 NOTE — Progress Notes (Signed)
Anticoagulation Management Trevor Aguilar is a 84 y.o. male who reports to the clinic for monitoring of warfarin treatment.    Indication: atrial fibrillation CHA2DS2 Vasc Score 4 (Age>72, CHF hx, vasculardisease), HAS-BLED 1 (Age >65)  Duration: indefinite Supervising physician: Yates Decamp  Anticoagulation Clinic Visit History:  Patient does not report signs/symptoms of bleeding or thromboembolism.   Other recent changes: No changes in diet, medications, lifestyle. Pt continues to eat TV dinners 1-2x/week. Otherwise cooking himself meatballs, spam, pork-chop, tuna.   Anticoagulation Episode Summary    Current INR goal:  2.0-3.0  TTR:  54.8 % (1.4 y)  Next INR check:  02/15/2020  INR from last check:  2.4 (01/18/2020)  Weekly max warfarin dose:    Target end date:  Indefinite  INR check location:    Preferred lab:    Send INR reminders to:     Indications   Atrial fibrillation (HCC) [I48.91] Monitoring for long-term anticoagulant use [Z51.81 Z79.01]       Comments:          Allergies  Allergen Reactions  . Restoril [Temazepam] Other (See Comments)    BAD DREAMS    Current Outpatient Medications:  .  metoprolol succinate (TOPROL-XL) 25 MG 24 hr tablet, TAKE 1 TABLET EVERY DAY, Disp: 90 tablet, Rfl: 3 .  simvastatin (ZOCOR) 40 MG tablet, TAKE 1 TABLET EVERY EVENING  AFTER  DINNER, Disp: 90 tablet, Rfl: 2 .  VITAMIN D PO, Take by mouth daily., Disp: , Rfl:  .  warfarin (COUMADIN) 3 MG tablet, Take 1 tablet (3 mg total) by mouth daily at 6 PM. Take as directed, Disp: 90 tablet, Rfl: 2 Past Medical History:  Diagnosis Date  . Acute on chronic diastolic congestive heart failure (HCC) 05/18/2014  . Aortic valve sclerosis 05/11/2014   Aortic valve sclerosis w/out stenosis  . Bilateral cataracts   . Chronic diastolic congestive heart failure (HCC) 05/18/2014  . Coronary artery disease 05/12/2014   High grade proximal LAD stenosis and moderate RCA stenosis  . Glaucoma   .  Hearing loss    bilateral hearing aids  . Heart murmur   . High cholesterol   . History of skull fracture 1978   lower skull  . Hypercholesteremia   . Hypertension   . Mitral regurgitation 05/12/2014   Mitral valve prolapse with flail segment of posterior leaflet and severe mitral regurgitation   . Mitral regurgitation 05/10/2014   Mitral valve prolapse with flail segment of posterior leaflet and severe mitral regurgitation   . Nonspecific abnormal electrocardiogram (ECG) (EKG)    Pt states he had a cardiac workup and passed all tests (Echo and Stress)  . Pneumonia 1967  . Pulmonary hypertension (HCC) 05/12/2014  . Rash, skin Dec 2015   near buttocks; PCP aware   . 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 Shortness of breath dyspnea    with ambulation  . Tricuspid regurgitation 05/02/2014   Moderate TR by transthoracic ECHO  . Umbilical hernia     ASSESSMENT  Recent Results: The most recent result is correlated with 24 mg per week:  Lab Results  Component Value Date   INR 2.4 01/18/2020   INR 2.8 12/22/2019   INR  2.5 11/24/2019    Anticoagulation Dosing: Description   INR at goal. Continue taking 4 mg every Monday, Wednesday, Friday and 3 mg all other days. Recheck INR in 4 weeks      INR today: Therapeutic. Pt continues to remain therapeutic on the current dose. Pt reports to being consistent with his diet since last check. No other relevant changes in diet, medications, or lifestyle.  Denies any bleeding or bruising episodes.   PLAN Weekly dose was unchanged. Continue current weekly dose of 4 mg every Mon, Wed, Fri and 3 mg all other days. Recheck INR in 4 weeks.    Patient Instructions  INR at goal.  Continue taking 4 mg every Monday, Wednesday, Friday and 3 mg all other days. Recheck INR in 4 weeks  Patient advised to contact clinic or seek medical attention if signs/symptoms of bleeding or thromboembolism occur.  Patient verbalized understanding by repeating back information and was advised to contact me if further medication-related questions arise.   Follow-up Return in about 4 weeks (around 02/15/2020).  Leonides Schanz, PharmD  15 minutes spent face-to-face with the patient during the encounter. 50% of time spent on education, including signs/sx bleeding and clotting, as well as food and drug interactions with warfarin. 50% of time was spent on fingerprick POC INR sample collection,processing, results determination, and documentationga

## 2020-02-15 ENCOUNTER — Other Ambulatory Visit: Payer: Self-pay | Admitting: Pharmacist

## 2020-02-15 ENCOUNTER — Ambulatory Visit: Payer: Medicare HMO | Admitting: Pharmacist

## 2020-02-15 ENCOUNTER — Other Ambulatory Visit: Payer: Self-pay

## 2020-02-15 DIAGNOSIS — Z7901 Long term (current) use of anticoagulants: Secondary | ICD-10-CM

## 2020-02-15 DIAGNOSIS — I4891 Unspecified atrial fibrillation: Secondary | ICD-10-CM

## 2020-02-15 LAB — POCT INR: INR: 2.2 (ref 2.0–3.0)

## 2020-02-15 MED ORDER — APIXABAN 5 MG PO TABS: 5.0000 mg | ORAL_TABLET | Freq: Two times a day (BID) | ORAL | 3 refills | Status: DC

## 2020-02-15 NOTE — Progress Notes (Signed)
Pt requesting transition from warfarin to eliquis. INR today of 2.2. Labs from 08/17/19 SCr: 1.18 and eGFR: 57. Last documented wt: 80.5 kgs from 01/03/20. Provided pt with samples today with instructions to hold warfarin for 2 days and start Eliquis 5 mg BID starting friday, 02/17/20. Reviewed eligibility for [employer] patient assistance. Pt not interested in starting application today but will reach out to the office if he needs assistance with completing the application in the future. Reviewed MOA, administration instructions, potential ADRs, DDI, and when to seek help for any uncontrolled bleeds or head trauma. Pt verbalized understanding.

## 2020-02-15 NOTE — Progress Notes (Signed)
Anticoagulation Management Trevor Aguilar is a 84 y.o. male who reports to the clinic for monitoring of warfarin treatment.    Indication: atrial fibrillation CHA2DS2 Vasc Score 4 (Age>72, CHF hx, vasculardisease), HAS-BLED 1 (Age >65)  Duration: indefinite Supervising physician: Yates Decamp  Anticoagulation Clinic Visit History:  Patient does not report signs/symptoms of bleeding or thromboembolism.   Other recent changes: No changes in diet, medications, lifestyle. Pt continues to eat TV dinners 1-2x/week. Otherwise cooking himself meatballs, spam, pork-chop, tuna.   Allergies  Allergen Reactions  . Restoril [Temazepam] Other (See Comments)    BAD DREAMS    Current Outpatient Medications:  .  metoprolol succinate (TOPROL-XL) 25 MG 24 hr tablet, TAKE 1 TABLET EVERY DAY, Disp: 90 tablet, Rfl: 3 .  simvastatin (ZOCOR) 40 MG tablet, TAKE 1 TABLET EVERY EVENING  AFTER  DINNER, Disp: 90 tablet, Rfl: 2 .  VITAMIN D PO, Take by mouth daily., Disp: , Rfl:  Past Medical History:  Diagnosis Date  . Acute on chronic diastolic congestive heart failure (HCC) 05/18/2014  . Aortic valve sclerosis 05/11/2014   Aortic valve sclerosis w/out stenosis  . Bilateral cataracts   . Chronic diastolic congestive heart failure (HCC) 05/18/2014  . Coronary artery disease 05/12/2014   High grade proximal LAD stenosis and moderate RCA stenosis  . Glaucoma   . Hearing loss    bilateral hearing aids  . Heart murmur   . High cholesterol   . History of skull fracture 1978   lower skull  . Hypercholesteremia   . Hypertension   . Mitral regurgitation 05/12/2014   Mitral valve prolapse with flail segment of posterior leaflet and severe mitral regurgitation   . Mitral regurgitation 05/10/2014   Mitral valve prolapse with flail segment of posterior leaflet and severe mitral regurgitation   . Nonspecific abnormal electrocardiogram (ECG) (EKG)    Pt states he had a cardiac workup and passed all tests (Echo and  Stress)  . Pneumonia 1967  . Pulmonary hypertension (HCC) 05/12/2014  . Rash, skin Dec 2015   near buttocks; PCP aware   . 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 Shortness of breath dyspnea    with ambulation  . Tricuspid regurgitation 05/02/2014   Moderate TR by transthoracic ECHO  . Umbilical hernia     ASSESSMENT  Recent Results: The most recent result is correlated with 24 mg per week:  Lab Results  Component Value Date   INR 2.2 02/15/2020   INR 2.4 01/18/2020   INR 2.8 12/22/2019    Anticoagulation Dosing:    INR today: Therapeutic. Pt stated that he would like to transition to Eliquis. Reviewed pt concerns of limited transportation resources and trouble with being able to coordinate continued in-office INR checks. Pt aware of the increased cost difference of Eliquis, compared to warfarin. Pt notes he called and confirmed with [facility] that it would cost ~$125/38-month supply. Pt is agreeable to proceed with Eliquis. Review the transition between warfarin and eliquis. Reviewed MOA, administration instructions, potential ADRs, DDI, and when to seek help for any uncontrolled bleeds or head trauma. Pt verbalized understanding.  PLAN STOP warfarin. Last warfarin dose on 02/14/20. Pt to start Eliquis 5 mg BID on 02/17/20.  Patient Instructions  INR at goal. STOP warfarin today. Start Eliquis 5 mg BID on 02/17/20  Patient advised to contact clinic or seek medical attention if signs/symptoms of bleeding or thromboembolism occur.  Patient verbalized understanding by repeating back information and was advised to contact me if further medication-related questions arise.   Follow-up No follow-ups on file.  Leonides Schanz,  PharmD  15 minutes spent face-to-face with the patient during the encounter. 50% of time spent on education, including signs/sx bleeding and clotting, as well as food and drug interactions with warfarin. 50% of time was spent on fingerprick POC INR sample collection,processing, results determination, and documentationga

## 2020-02-15 NOTE — Patient Instructions (Signed)
INR at goal. STOP warfarin today. Start Eliquis 5 mg BID on 02/17/20

## 2020-03-07 ENCOUNTER — Ambulatory Visit: Payer: Medicare HMO | Admitting: Podiatry

## 2020-03-07 ENCOUNTER — Other Ambulatory Visit: Payer: Self-pay

## 2020-03-07 ENCOUNTER — Encounter: Payer: Self-pay | Admitting: Podiatry

## 2020-03-07 DIAGNOSIS — M79676 Pain in unspecified toe(s): Secondary | ICD-10-CM

## 2020-03-07 DIAGNOSIS — D689 Coagulation defect, unspecified: Secondary | ICD-10-CM | POA: Insufficient documentation

## 2020-03-07 DIAGNOSIS — B351 Tinea unguium: Secondary | ICD-10-CM

## 2020-03-07 NOTE — Progress Notes (Signed)
This patient returns to my office for at risk foot care.  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   Patient is taking coumadin.  This patient is unable to cut nails himself since the patient cannot reach his nails.These nails are painful walking and wearing shoes.  This patient presents for at risk foot care today.  General Appearance  Alert, conversant and in no acute stress.  Vascular  Dorsalis pedis and posterior tibial  pulses are palpable  bilaterally.  Capillary return is within normal limits  bilaterally. Temperature is within normal limits  bilaterally.  Neurologic  Senn-Weinstein monofilament wire test within normal limits  bilaterally. Muscle power within normal limits bilaterally.  Nails Thick disfigured discolored nails with subungual debris  from hallux to fifth toes bilaterally. No evidence of bacterial infection or drainage bilaterally.  Orthopedic  No limitations of motion  feet .  No crepitus or effusions noted.  No bony pathology or digital deformities noted.  Plantar flex fifth metatarsal  B/L.  DJD 1st MPJ  B/L>  Skin  normotropic skin with no porokeratosis noted bilaterally.  No signs of infections or ulcers noted.   Asymptomatic callus  B/L.  Onychomycosis  Pain in right toes  Pain in left toes  Consent was obtained for treatment procedures.   Mechanical debridement of nails 1-5  bilaterally performed with a nail nipper.  Filed with dremel without incident.    Return office visit   3 months                   Told patient to return for periodic foot care and evaluation due to potential at risk complications.   Helane Gunther DPM

## 2020-04-23 ENCOUNTER — Other Ambulatory Visit: Payer: Self-pay | Admitting: Cardiology

## 2020-06-06 ENCOUNTER — Ambulatory Visit: Payer: Medicare HMO | Admitting: Podiatry

## 2020-07-03 ENCOUNTER — Encounter: Payer: Self-pay | Admitting: Podiatry

## 2020-07-03 ENCOUNTER — Other Ambulatory Visit: Payer: Self-pay

## 2020-07-03 ENCOUNTER — Ambulatory Visit: Payer: Medicare HMO | Admitting: Podiatry

## 2020-07-03 DIAGNOSIS — B351 Tinea unguium: Secondary | ICD-10-CM | POA: Diagnosis not present

## 2020-07-03 DIAGNOSIS — M79676 Pain in unspecified toe(s): Secondary | ICD-10-CM | POA: Diagnosis not present

## 2020-07-03 DIAGNOSIS — L989 Disorder of the skin and subcutaneous tissue, unspecified: Secondary | ICD-10-CM

## 2020-07-03 DIAGNOSIS — D689 Coagulation defect, unspecified: Secondary | ICD-10-CM

## 2020-07-03 NOTE — Progress Notes (Signed)
This patient returns to my office for at risk foot care.  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   Patient is taking coumadin.  This patient is unable to cut nails himself since the patient cannot reach his nails.These nails are painful walking and wearing shoes.  This patient presents for at risk foot care today.  General Appearance  Alert, conversant and in no acute stress.  Vascular   posterior tibial  pulses are weakly palpable  Bilaterally.  Dorsalis pedis not palpable  Bilaterally.  Capillary return is within normal limits  bilaterally.  Bilaterally.Venous stasis feet.  Cold feet  Neurologic  Senn-Weinstein monofilament wire test within normal limits  bilaterally. Muscle power within normal limits bilaterally.  Nails Thick disfigured discolored nails with subungual debris  from hallux to fifth toes bilaterally. No evidence of bacterial infection or drainage bilaterally.  Orthopedic  No limitations of motion  feet .  No crepitus or effusions noted.  No bony pathology or digital deformities noted.  Plantar flex fifth metatarsal  B/L.  DJD 1st MPJ  B/L>  Skin  normotropic skin with no porokeratosis noted bilaterally.  No signs of infections or ulcers noted.   Symptomatic porokeratosis sub 5th met left foot..  Onychomycosis  Pain in right toes  Pain in left toes  Porokeratosis sub 5th left foot.  Consent was obtained for treatment procedures.   Mechanical debridement of nails 1-5  bilaterally performed with a nail nipper.  Filed with dremel without incident. Debride with # 15 blade.   Return office visit   3 months                   Told patient to return for periodic foot care and evaluation due to potential at risk complications.   Gardiner Barefoot DPM

## 2020-07-30 ENCOUNTER — Other Ambulatory Visit: Payer: Self-pay

## 2020-07-30 DIAGNOSIS — Z7901 Long term (current) use of anticoagulants: Secondary | ICD-10-CM

## 2020-07-30 DIAGNOSIS — Z5181 Encounter for therapeutic drug level monitoring: Secondary | ICD-10-CM

## 2020-07-30 DIAGNOSIS — I4891 Unspecified atrial fibrillation: Secondary | ICD-10-CM

## 2020-07-30 MED ORDER — APIXABAN 5 MG PO TABS: 5.0000 mg | ORAL_TABLET | Freq: Two times a day (BID) | ORAL | 1 refills | Status: DC

## 2020-07-30 MED ORDER — SIMVASTATIN 40 MG PO TABS: 40.0000 mg | ORAL_TABLET | Freq: Every day | ORAL | 1 refills | Status: DC

## 2020-08-06 ENCOUNTER — Ambulatory Visit: Payer: Medicare HMO | Admitting: Cardiology

## 2020-08-06 ENCOUNTER — Other Ambulatory Visit: Payer: Self-pay

## 2020-08-06 ENCOUNTER — Encounter: Payer: Self-pay | Admitting: Cardiology

## 2020-08-06 VITALS — BP 126/63 | HR 50 | Temp 98.0°F | Resp 16 | Ht 72.0 in | Wt 188.0 lb

## 2020-08-06 DIAGNOSIS — E7800 Pure hypercholesterolemia, unspecified: Secondary | ICD-10-CM

## 2020-08-06 DIAGNOSIS — I480 Paroxysmal atrial fibrillation: Secondary | ICD-10-CM

## 2020-08-06 DIAGNOSIS — I2510 Atherosclerotic heart disease of native coronary artery without angina pectoris: Secondary | ICD-10-CM

## 2020-08-06 DIAGNOSIS — Z9889 Other specified postprocedural states: Secondary | ICD-10-CM

## 2020-08-06 DIAGNOSIS — N1831 Chronic kidney disease, stage 3a: Secondary | ICD-10-CM

## 2020-08-06 DIAGNOSIS — I10 Essential (primary) hypertension: Secondary | ICD-10-CM

## 2020-08-06 NOTE — Progress Notes (Signed)
Primary Physician/Referring:  Vicenta Aly, FNP  Patient ID: Trevor Aguilar, male    DOB: [birth year], 85 y.o.   MRN: [id number]  Chief Complaint  Patient presents with  . Atrial Fibrillation  . MV replacement  . Follow-up    1 year  . Coronary Artery Disease   HPI:    Trevor Aguilar  is a 85 y.o. Caucasian male patient  with CAD S/P CABG and mitral repair on 05/25/2014 with placement of a 28 mm annuloplasty ring along with LIMA to LAD and SVG to RCA by Dr. Darylene Price. PAF, chronic frequent PVCs and bradycardia that is asymptomatic and chronic leg edema. Past medical history significant for hypertension, hyperlipidemia, stage III chronic kidney disease.  He is presently doing well, tolerating all his medications well and he has not had any dyspnea, PND or orthopnea, he has minimal leg edema that is stable.  No bleeding diathesis on Eliquis.  Past Medical History:  Diagnosis Date  . Acute on chronic diastolic congestive heart failure ([place]) 05/18/2014  . Aortic valve sclerosis 05/11/2014   Aortic valve sclerosis w/out stenosis  . Bilateral cataracts   . Chronic diastolic congestive heart failure ([place]) 05/18/2014  . Coronary artery disease 05/12/2014   High grade proximal LAD stenosis and moderate RCA stenosis  . Glaucoma   . Hearing loss    bilateral hearing aids  . Heart murmur   . High cholesterol   . History of skull fracture 1978   lower skull  . Hypercholesteremia   . Hypertension   . Mitral regurgitation 05/12/2014   Mitral valve prolapse with flail segment of posterior leaflet and severe mitral regurgitation   . Mitral regurgitation 05/10/2014   Mitral valve prolapse with flail segment of posterior leaflet and severe mitral regurgitation   . Nonspecific abnormal electrocardiogram (ECG) (EKG)    Pt states he had a cardiac workup and passed all tests (Echo and Stress)  . Pneumonia 1967  . Pulmonary hypertension ([place]) 05/12/2014  . Rash, skin Dec 2015   near  buttocks; PCP aware   . 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 Shortness of breath dyspnea    with ambulation  . Tricuspid regurgitation 05/02/2014   Moderate TR by transthoracic ECHO  . Umbilical hernia    Past Surgical History:  Procedure Laterality Date  . CARDIAC CATHETERIZATION    . CATARACT EXTRACTION W/PHACO Left 03/01/2014   Procedure: CATARACT EXTRACTION PHACO AND INTRAOCULAR LENS PLACEMENT (IOC);  Surgeon: Marylynn Pearson, MD;  Location: [place];  Service: Ophthalmology;  Laterality: Left;  . COLONOSCOPY    . CORONARY ARTERY BYPASS GRAFT N/A 05/25/2014   Procedure: CORONARY ARTERY BYPASS GRAFTING (CABG)TIMES 2 USING LEFT INTERNAL MAMMARY AND LEFT SAPHENOUS VEIN HARVESTED ENDOSCOPICALLY;  Surgeon: Rexene Alberts, MD;  Location: [place];  Service: Open Heart Surgery;  Laterality: N/A;  . EYE SURGERY Right 05/14/12   cataract removal with lens implant  . FACIAL FRACTURE SURGERY  1970's  . LEFT AND RIGHT HEART CATHETERIZATION WITH CORONARY ANGIOGRAM N/A 05/12/2014   Procedure: LEFT AND RIGHT HEART CATHETERIZATION WITH CORONARY ANGIOGRAM;  Surgeon: Laverda Page, MD;  Location: [facility] CATH LAB;  Service: Cardiovascular;  Laterality: N/A;  . MITRAL VALVE REPAIR  N/A 05/25/2014   Procedure: MITRAL VALVE REPAIR (MVR) WITH CLOSURE OF PATENT FORAMEN OVALE;  Surgeon: Rexene Alberts, MD;  Location: [place];  Service: Open Heart Surgery;  Laterality: N/A;  . TEE WITHOUT CARDIOVERSION N/A 05/11/2014   Procedure: TRANSESOPHAGEAL ECHOCARDIOGRAM (TEE);  Surgeon: Laverda Page, MD;  Location: [place];  Service: Cardiovascular;  Laterality: N/A;  . TEE WITHOUT CARDIOVERSION N/A 05/25/2014   Procedure: TRANSESOPHAGEAL ECHOCARDIOGRAM (TEE);   Surgeon: Rexene Alberts, MD;  Location: [place];  Service: Open Heart Surgery;  Laterality: N/A;  . TONSILLECTOMY    . TRABECULECTOMY Left 03/01/2014   Procedure: TRABECULECTOMY;  Surgeon: Marylynn Pearson, MD;  Location: [place];  Service: Ophthalmology;  Laterality: Left;   Family History  Problem Relation Age of Onset  . Cancer Mother        breast  . Alcohol abuse Father   . Cataracts Father   . Heart disease Father        angina  . Arthritis Sister     Social History   Tobacco Use  . Smoking status: Former Smoker    Packs/day: 1.00    Years: 50.00    Pack years: 50.00    Types: Cigarettes    Quit date: 03/01/1983    Years since quitting: 37.4  . Smokeless tobacco: Never Used  Substance Use Topics  . Alcohol use: Yes    Alcohol/week: 21.0 standard drinks    Types: 21 Standard drinks or equivalent per week    Comment: 2 - 4 glasses of scotch daily   ROS   Review of Systems  Cardiovascular: Positive for leg swelling. Negative for chest pain and dyspnea on exertion.  Gastrointestinal: Negative for melena.    Objective  Blood pressure 126/63, pulse (!) 50, temperature 98 F (36.7 C), temperature source Temporal, resp. rate 16, height 6' (1.829 m), weight 188 lb (85.3 kg), SpO2 97 %.  Vitals with BMI 08/06/2020 08/05/2019 01/14/2019  Height _0  _1  _2   Weight 188 lbs 178 lbs 2 oz 183 lbs  BMI 25.49 41.32 44.01  Systolic 027 253 664  Diastolic 63 63 72  Pulse 50 38 43     Physical Exam Constitutional:      General: He is not in acute distress.    Appearance: He is well-developed.  Cardiovascular:     Rate and Rhythm: Normal rate. Rhythm irregularly irregular. Frequent extrasystoles are present.    Heart sounds: Normal heart sounds. No murmur heard. No gallop.      Comments: Right lower extremity  2+ pitting ankle edema, left leg trace edema.  Normal carotids, normal femoral and popliteal artery, DP 1-2+ bilateral.  PT could not be felt. Pulmonary:     Effort:  Pulmonary effort is normal.     Breath sounds: Normal breath sounds.  Abdominal:     General: Bowel sounds are normal.     Palpations: Abdomen is soft.    Laboratory examination:   Recent Labs    08/17/19 1041  NA 139  K 4.3  CL 101  CO2 26  GLUCOSE 104*  BUN 16  CREATININE 1.18  CALCIUM 9.4  GFRNONAA 57*  GFRAA 66    Recent Labs    08/17/19 1041  TSH 2.600   External labs :   Labs 01/03/2020:   A1c 5.7%.  Total cholesterol 137, triglycerides 101, HDL 39, LDL 79.  Hb 14.2/HCT 41.6, platelets 211.  Serum glucose 107 mg, BUN 16, creatinine 1.30, EGFR  51 mL, CMP otherwise normal, potassium 4.0.  Cholesterol, total 105.000 12/31/2018 HDL 35.000 12/31/2018 LDL 55.000 12/31/2018 Triglycerides 73.000 12/31/2018  Hemoglobin 13.800 G/ 12/31/2018, Platelets 181.000 12/31/2018, INR 2.300 07/15/2019  Creatinine, Serum 1.060 12/31/2018 Potassium 4.700 12/31/2018 ALT (SGPT) 28.000 12/31/2018 TSH 2.360 12/31/2018  Medications and allergies   Allergies  Allergen Reactions  . Restoril [Temazepam] Other (See Comments)    BAD DREAMS    Current Outpatient Medications on File Prior to Visit  Medication Sig Dispense Refill  . apixaban (ELIQUIS) 5 MG TABS tablet Take 1 tablet (5 mg total) by mouth 2 (two) times daily. 180 tablet 1  . hydrocortisone 2.5 % cream Apply topically.    . metoprolol succinate (TOPROL-XL) 25 MG 24 hr tablet TAKE 1 TABLET EVERY DAY 90 tablet 3  . simvastatin (ZOCOR) 40 MG tablet Take 1 tablet (40 mg total) by mouth at bedtime. 90 tablet 1  . triamcinolone cream (KENALOG) 0.1 % Apply topically.     No current facility-administered medications on file prior to visit.    Radiology:   No results found.  Cardiac Studies:    Echo- 05/22/2016 1. Left ventricle cavity is normal in size. Mild decrease in global wall motion. Visual EF is 45-50%. Diastolic function can't be assessed properly due to mitral annulus calcification, valve repair ring and mitral  stenosis Calculated EF 51%. 2. Left atrial cavity is mildly dilated. 3. Trace aortic regurgitation. Mild aortic valve leaflet calcification. Mildly restricted aortic valve leaflets. No evidence of aortic valve stenosis. 4. Trace mitral regurgitation. Moderate calcification of the mitral valve annulus. Echoes of mitral valve repair ring are seen. Mild mitral valve stenosis, mean mitral gradient 3 mm of Hg, calculated valve area 1.4 cm2. 5. Mild to moderate tricuspid regurgitation. Mild to moderate pulmonary hypertension with approx. PA syst. pressure of 42 mm of Hg. 6. No diagnostic change c.f. echo. of 06/21/2014.  CABG and mitral valve repair on 05/25/2014 with triangular resection of posterior leaflet, artificial Gore-Tex neochord placement 2, Sorin Memo 3D 28 mm annuloplasty ring placement, LIMA to LAD and SVG to RCA by Dr. Lilly Cove.  EKG:   EKG 08/06/2020: Marked sinus bradycardia at rate of 57 bpm with frequent PVCs in bigeminal pattern.  Normal axis.  Incomplete right bundle branch block.  Nonspecific inferior and lateral sagging ST segment depression.   No significant change from EKG 08/05/2019, 01/14/2019, EKG 01/29/2018  Assessment     ICD-10-CM   1. Paroxysmal atrial fibrillation (HCC)  I48.0 EKG 12-Lead  2. Coronary artery disease involving native coronary artery of native heart without angina pectoris  I25.10   3. S/P MVR (mitral valve repair)  Z98.890   4. Stage 3a chronic kidney disease (HCC)  N18.31 CMP14+EGFR  5. Primary hypertension  I10 CBC    TSH  6. Hypercholesteremia  E78.00 Lipid Panel With LDL/HDL Ratio   CHA2DS2-VASc Score is 4.  Yearly risk of stroke: 4.8% (A, HTN, Vasc Dz).  Score of 1=0.6; 2=2.2; 3=3.2; 4=4.8; 5=7.2; 6=9.8; 7=>9.8) -(CHF; HTN; vasc disease DM,  Male = 1; Age <65 =0; 65-74 = 1,  >75 =2; stroke/embolism= 2).   No orders of the defined types were placed in this encounter.   Medications Discontinued During This Encounter  Medication Reason  .  VITAMIN D PO Error   Orders Placed This Encounter  Procedures  . Lipid Panel With LDL/HDL Ratio  . CBC  . CMP14+EGFR  . TSH  . EKG 12-Lead    Recommendations:  Nickalas Mccarrick  is a  85 y.o. Caucasian male patient  with CAD S/P CABG and mitral repair on 05/25/2014 with placement of a 28 mm annuloplasty ring along with LIMA to LAD and SVG to RCA by Dr. Darylene Price. PAF, chronic frequent PVCs and bradycardia that is asymptomatic and chronic leg edema. Past medical history significant for hypertension, hyperlipidemia, stage III chronic kidney disease.  He remains asymptomatic, no change in his physical exam, reviewed his labs, his labs need to be updated, which I will order today.  His medications were reviewed, again discussed effects of warfarin and bleeding diathesis.  I will see him back on an annual basis.  No change in his PVCs, no clinical evidence of heart failure and no recurrence of angina pectoris.   Adrian Prows, MD, [facility] 08/06/2020, 11:21 AM Office: [phone] Pager: [phone]

## 2020-08-07 LAB — LIPID PANEL WITH LDL/HDL RATIO
Cholesterol, Total: 125 mg/dL (ref 100–199)
HDL: 36 mg/dL — ABNORMAL LOW (ref >39)
LDL Chol Calc (NIH): 72 mg/dL (ref 0–99)
LDL/HDL Ratio: 2 ratio (ref 0.0–3.6)
Triglycerides: 90 mg/dL (ref 0–149)
VLDL Cholesterol Cal: 17 mg/dL (ref 5–40)

## 2020-08-07 LAB — CMP14+EGFR
ALT: 23 IU/L (ref 0–44)
AST: 26 IU/L (ref 0–40)
Albumin/Globulin Ratio: 1.5 (ref 1.2–2.2)
Albumin: 4.2 g/dL (ref 3.6–4.6)
Alkaline Phosphatase: 105 IU/L (ref 44–121)
BUN/Creatinine Ratio: 12 (ref 10–24)
BUN: 15 mg/dL (ref 8–27)
Bilirubin Total: 1 mg/dL (ref 0.0–1.2)
CO2: 24 mmol/L (ref 20–29)
Calcium: 9.5 mg/dL (ref 8.6–10.2)
Chloride: 102 mmol/L (ref 96–106)
Creatinine, Ser: 1.24 mg/dL (ref 0.76–1.27)
Globulin, Total: 2.8 g/dL (ref 1.5–4.5)
Glucose: 103 mg/dL — ABNORMAL HIGH (ref 65–99)
Potassium: 4.5 mmol/L (ref 3.5–5.2)
Sodium: 142 mmol/L (ref 134–144)
Total Protein: 7 g/dL (ref 6.0–8.5)
eGFR: 58 mL/min/{1.73_m2} — ABNORMAL LOW (ref >59)

## 2020-08-07 LAB — CBC
Hematocrit: 41.2 % (ref 37.5–51.0)
Hemoglobin: 14 g/dL (ref 13.0–17.7)
MCH: 31.9 pg (ref 26.6–33.0)
MCHC: 34 g/dL (ref 31.5–35.7)
MCV: 94 fL (ref 79–97)
Platelets: 193 10*3/uL (ref 150–450)
RBC: 4.39 x10E6/uL (ref 4.14–5.80)
RDW: 12.1 % (ref 11.6–15.4)
WBC: 7.5 10*3/uL (ref 3.4–10.8)

## 2020-08-07 LAB — TSH: TSH: 2.73 u[IU]/mL (ref 0.450–4.500)

## 2020-08-07 NOTE — Progress Notes (Signed)
Labs are within normal limits and kidney function is stable. I have forwarded them to PCP, he should make an appointment

## 2020-08-09 NOTE — Progress Notes (Signed)
Patient is aware of lab results.

## 2020-10-02 ENCOUNTER — Ambulatory Visit: Payer: Medicare HMO | Admitting: Podiatry

## 2020-10-02 ENCOUNTER — Encounter: Payer: Self-pay | Admitting: Podiatry

## 2020-10-02 ENCOUNTER — Other Ambulatory Visit: Payer: Self-pay

## 2020-10-02 DIAGNOSIS — D689 Coagulation defect, unspecified: Secondary | ICD-10-CM

## 2020-10-02 DIAGNOSIS — L989 Disorder of the skin and subcutaneous tissue, unspecified: Secondary | ICD-10-CM | POA: Diagnosis not present

## 2020-10-02 DIAGNOSIS — B351 Tinea unguium: Secondary | ICD-10-CM

## 2020-10-02 DIAGNOSIS — M79676 Pain in unspecified toe(s): Secondary | ICD-10-CM

## 2020-10-02 NOTE — Progress Notes (Signed)
This patient returns to my office for at risk foot care.  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 This patient is unable to cut nails himself since the patient cannot reach his nails.These nails are painful walking and wearing shoes.  This patient presents for at risk foot care today.  General Appearance  Alert, conversant and in no acute stress.  Vascular  Dorsalis pedis pulses are not palpable  bilateral.  posterior tibial  pulses are weakly  palpable  bilaterally.  Capillary return is within normal limits  bilaterally. Cold feet.  Venous stasis feet  B/L.  bilaterally.  Neurologic  Senn-Weinstein monofilament wire test within normal limits  bilaterally. Muscle power within normal limits bilaterally.  Nails Thick disfigured discolored nails with subungual debris  from hallux to fifth toes bilaterally. No evidence of bacterial infection or drainage bilaterally.  Orthopedic  No limitations of motion  feet .  No crepitus or effusions noted.  No bony pathology or digital deformities noted. Plantar flex fifth metatarsal left foot.  DJD 1st MPJ  B/L.  Skin  normotropic skin noted bilaterally.  No signs of infections or ulcers noted.  Symptomatic callus sub 5th met left foot.   Callus left foot  Consent was obtained for treatment procedures.   Debride callus with # 15 blade.   Return office visit   3 months                  Told patient to return for periodic foot care and evaluation due to potential at risk complications.   Gardiner Barefoot DPM

## 2020-10-16 ENCOUNTER — Other Ambulatory Visit: Payer: Self-pay | Admitting: Cardiology

## 2020-10-19 ENCOUNTER — Telehealth: Payer: Self-pay

## 2020-10-19 NOTE — Telephone Encounter (Signed)
Error

## 2020-12-29 ENCOUNTER — Other Ambulatory Visit: Payer: Self-pay | Admitting: Cardiology

## 2020-12-29 DIAGNOSIS — Z5181 Encounter for therapeutic drug level monitoring: Secondary | ICD-10-CM

## 2020-12-29 DIAGNOSIS — Z7901 Long term (current) use of anticoagulants: Secondary | ICD-10-CM

## 2020-12-29 DIAGNOSIS — I4891 Unspecified atrial fibrillation: Secondary | ICD-10-CM

## 2021-01-02 ENCOUNTER — Ambulatory Visit: Payer: Medicare HMO | Admitting: Podiatry

## 2021-01-09 ENCOUNTER — Other Ambulatory Visit: Payer: Self-pay

## 2021-01-09 ENCOUNTER — Ambulatory Visit: Payer: Medicare HMO | Admitting: Podiatry

## 2021-01-09 ENCOUNTER — Encounter: Payer: Self-pay | Admitting: Podiatry

## 2021-01-09 DIAGNOSIS — L989 Disorder of the skin and subcutaneous tissue, unspecified: Secondary | ICD-10-CM | POA: Diagnosis not present

## 2021-01-09 DIAGNOSIS — D689 Coagulation defect, unspecified: Secondary | ICD-10-CM | POA: Diagnosis not present

## 2021-01-09 DIAGNOSIS — M79676 Pain in unspecified toe(s): Secondary | ICD-10-CM

## 2021-01-09 DIAGNOSIS — B351 Tinea unguium: Secondary | ICD-10-CM

## 2021-01-09 NOTE — Progress Notes (Signed)
This patient returns to my office for at risk foot care.  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 This patient is unable to cut  his painful callus left foot.  This callus is painful walking and wearing his shoes. This patient presents for at risk foot care today.  General Appearance  Alert, conversant and in no acute stress.  Vascular  Dorsalis pedis pulses are not palpable  bilateral.  posterior tibial  pulses are weakly  palpable  bilaterally.  Capillary return is within normal limits  bilaterally. Cold feet.  Venous stasis feet  B/L.  bilaterally.  Neurologic  Senn-Weinstein monofilament wire test within normal limits  bilaterally. Muscle power within normal limits bilaterally.  Nails Thick disfigured discolored nails with subungual debris  from hallux to fifth toes bilaterally. No evidence of bacterial infection or drainage bilaterally.  Orthopedic  No limitations of motion  feet .  No crepitus or effusions noted.  No bony pathology or digital deformities noted. Plantar flex fifth metatarsal left foot.  DJD 1st MPJ  B/L.  Skin  normotropic skin noted bilaterally.  No signs of infections or ulcers noted.  Symptomatic callus sub 5th met left foot.   Callus left foot  Consent was obtained for treatment procedures.   Debride callus with # 15 blade.   Return office visit   3 months                  Told patient to return for periodic foot care and evaluation due to potential at risk complications.   Gardiner Barefoot DPM

## 2021-04-17 ENCOUNTER — Ambulatory Visit: Payer: Medicare HMO | Admitting: Podiatry

## 2021-04-24 ENCOUNTER — Ambulatory Visit: Payer: Medicare HMO | Admitting: Podiatry

## 2021-04-24 ENCOUNTER — Other Ambulatory Visit: Payer: Self-pay

## 2021-04-24 ENCOUNTER — Encounter: Payer: Self-pay | Admitting: Podiatry

## 2021-04-24 DIAGNOSIS — M79676 Pain in unspecified toe(s): Secondary | ICD-10-CM

## 2021-04-24 DIAGNOSIS — L989 Disorder of the skin and subcutaneous tissue, unspecified: Secondary | ICD-10-CM | POA: Diagnosis not present

## 2021-04-24 DIAGNOSIS — B351 Tinea unguium: Secondary | ICD-10-CM

## 2021-04-24 DIAGNOSIS — L84 Corns and callosities: Secondary | ICD-10-CM | POA: Diagnosis not present

## 2021-04-24 DIAGNOSIS — D689 Coagulation defect, unspecified: Secondary | ICD-10-CM | POA: Diagnosis not present

## 2021-04-24 NOTE — Progress Notes (Signed)
  Subjective:  Patient ID: Trevor Aguilar, male    DOB: Dec 28, 1935,   MRN: [id number]  No chief complaint on file.   85 y.o. male presents for at risk foot care. Patient relates elongated thickened and painful toenails that he has difficulty trimming. Also relates a callus that is painful. Patient has been in the care of Dr. Stacie Acres. He is currently on eliquis . Denies any other pedal complaints. Denies n/v/f/c.   Past Medical History:  Diagnosis Date   Acute on chronic diastolic congestive heart failure (HCC) 05/18/2014   Aortic valve sclerosis 05/11/2014   Aortic valve sclerosis w/out stenosis   Bilateral cataracts    Chronic diastolic congestive heart failure (HCC) 05/18/2014   Coronary artery disease 05/12/2014   High grade proximal LAD stenosis and moderate RCA stenosis   Glaucoma    Hearing loss    bilateral hearing aids   Heart murmur    High cholesterol    History of skull fracture 1978   lower skull   Hypercholesteremia    Hypertension    Mitral regurgitation 05/12/2014   Mitral valve prolapse with flail segment of posterior leaflet and severe mitral regurgitation    Mitral regurgitation 05/10/2014   Mitral valve prolapse with flail segment of posterior leaflet and severe mitral regurgitation    Nonspecific abnormal electrocardiogram (ECG) (EKG)    Pt states he had a cardiac workup and passed all tests (Echo and Stress)   Pneumonia 1967   Pulmonary hypertension (HCC) 05/12/2014   Rash, skin Dec 2015   near buttocks; PCP aware    S/P CABG x 2 05/25/2014   LIMA to LAD, SVG to RCA, EVH via bilateral thighs    S/P mitral valve repair 05/25/2014   Complex valvuloplasty including triangular resection of flail segment of posterior leaflet, artificial Goretex neochord placement x2 and 28 mm Sorin Memo 3D Rechord ring annuloplasty    Shortness of breath dyspnea    with ambulation   Tricuspid regurgitation 05/02/2014   Moderate TR by transthoracic ECHO   Umbilical hernia      Objective:  Physical Exam: Vascular: DP/PT pulses 2/4 bilateral. CFT <3 seconds. Normal hair growth on digits. No edema.  Skin. No lacerations or abrasions bilateral feet. Nails 1-5 are thickened discolored and elongated with subungual debris. Hyperkeratotic lesion noted to left plantar fifth metatarsal head. With hemosiderin deposits underlying callus.  Musculoskeletal: MMT 5/5 bilateral lower extremities in DF, PF, Inversion and Eversion. Deceased ROM in DF of ankle joint.  Neurological: Sensation intact to light touch.   Assessment:   1. Pain due to onychomycosis of toenail   2. Coagulation disorder (HCC)      Plan:  Patient was evaluated and treated and all questions answered. -Discussed and educated patient on foot care, especially with  regards to the vascular, neurological and musculoskeletal systems.  -Discussed supportive shoes at all times and checking feet regularly.  -Mechanically debrided all nails 1-5 bilateral using sterile nail nipper and filed with dremel without incident  -Hyperkeratotic tissue debrided without incident.  -Answered all patient questions -Patient to return  in 3 months for at risk foot care -Patient advised to call the office if any problems or questions arise in the meantime.   Louann Sjogren, DPM

## 2021-07-29 ENCOUNTER — Ambulatory Visit: Payer: Medicare HMO | Admitting: Podiatry

## 2021-07-29 ENCOUNTER — Other Ambulatory Visit: Payer: Self-pay

## 2021-07-29 DIAGNOSIS — L989 Disorder of the skin and subcutaneous tissue, unspecified: Secondary | ICD-10-CM | POA: Diagnosis not present

## 2021-07-29 DIAGNOSIS — B351 Tinea unguium: Secondary | ICD-10-CM | POA: Diagnosis not present

## 2021-07-29 DIAGNOSIS — M79674 Pain in right toe(s): Secondary | ICD-10-CM | POA: Diagnosis not present

## 2021-07-29 DIAGNOSIS — M79675 Pain in left toe(s): Secondary | ICD-10-CM

## 2021-07-29 NOTE — Progress Notes (Signed)
SUBJECTIVE Patient presents to office today complaining of elongated, thickened nails that cause pain while ambulating in shoes.  Patient is unable to trim their own nails.  Patient also developed symptomatic calluses to the bilateral feet and plantar aspect of the fifth MTP that he would like to have trimmed and debrided patient is here for further evaluation and treatment.  Past Medical History:  Diagnosis Date   Acute on chronic diastolic congestive heart failure ([place]) 05/18/2014   Aortic valve sclerosis 05/11/2014   Aortic valve sclerosis w/out stenosis   Bilateral cataracts    Chronic diastolic congestive heart failure ([place]) 05/18/2014   Coronary artery disease 05/12/2014   High grade proximal LAD stenosis and moderate RCA stenosis   Glaucoma    Hearing loss    bilateral hearing aids   Heart murmur    High cholesterol    History of skull fracture 1978   lower skull   Hypercholesteremia    Hypertension    Mitral regurgitation 05/12/2014   Mitral valve prolapse with flail segment of posterior leaflet and severe mitral regurgitation    Mitral regurgitation 05/10/2014   Mitral valve prolapse with flail segment of posterior leaflet and severe mitral regurgitation    Nonspecific abnormal electrocardiogram (ECG) (EKG)    Pt states he had a cardiac workup and passed all tests (Echo and Stress)   Pneumonia 1967   Pulmonary hypertension ([place]) 05/12/2014   Rash, skin Dec 2015   near buttocks; PCP aware    S/P CABG x 2 05/25/2014   LIMA to LAD, SVG to RCA, EVH via bilateral thighs    S/P mitral valve repair 05/25/2014   Complex valvuloplasty including triangular resection of flail segment of posterior leaflet, artificial Goretex neochord placement x2 and 28 mm Sorin Memo 3D Rechord ring annuloplasty    Shortness of breath dyspnea    with ambulation   Tricuspid regurgitation 05/02/2014   Moderate TR by transthoracic ECHO   Umbilical hernia     OBJECTIVE General Patient is awake,  alert, and oriented x 3 and in no acute distress. Derm Skin is dry and supple bilateral. Negative open lesions or macerations. Remaining integument unremarkable. Nails are tender, long, thickened and dystrophic with subungual debris, consistent with onychomycosis, 1-5 bilateral. No signs of infection noted.  Hyperkeratotic preulcerative callus tissue also noted to the bilateral feet Vasc  DP and PT pedal pulses palpable bilaterally. Temperature gradient within normal limits.  Neuro Epicritic and protective threshold sensation grossly intact bilaterally.  Musculoskeletal Exam No symptomatic pedal deformities noted bilateral. Muscular strength within normal limits.  ASSESSMENT 1.  Pain due to onychomycosis of toenails both 2.  Hyperkeratotic preulcerative callus tissue bilateral  PLAN OF CARE 1. Patient evaluated today.  2. Instructed to maintain good pedal hygiene and foot care.  3. Mechanical debridement of nails 1-5 bilaterally performed using a nail nipper. Filed with dremel without incident.  4.  Excisional debridement of the hyperkeratotic callus tissue was performed using a tissue nipper without incident or bleeding  5.  Return to clinic in 3 mos.    Edrick Kins, DPM [facility]  Dr. Edrick Kins, DPM    [address], [place] [address]  Office [phone]  Fax [phone]

## 2021-08-07 ENCOUNTER — Other Ambulatory Visit: Payer: Self-pay

## 2021-08-07 ENCOUNTER — Encounter: Payer: Self-pay | Admitting: Cardiology

## 2021-08-07 ENCOUNTER — Ambulatory Visit: Payer: Medicare HMO | Admitting: Cardiology

## 2021-08-07 VITALS — BP 157/52 | HR 56 | Temp 98.2°F | Resp 16 | Ht 72.0 in | Wt 198.2 lb

## 2021-08-07 DIAGNOSIS — Z9889 Other specified postprocedural states: Secondary | ICD-10-CM

## 2021-08-07 DIAGNOSIS — I10 Essential (primary) hypertension: Secondary | ICD-10-CM

## 2021-08-07 DIAGNOSIS — I2510 Atherosclerotic heart disease of native coronary artery without angina pectoris: Secondary | ICD-10-CM

## 2021-08-07 DIAGNOSIS — I480 Paroxysmal atrial fibrillation: Secondary | ICD-10-CM

## 2021-08-07 DIAGNOSIS — N1831 Chronic kidney disease, stage 3a: Secondary | ICD-10-CM

## 2021-08-07 MED ORDER — OLMESARTAN MEDOXOMIL-HCTZ 20-12.5 MG PO TABS: 1.0000 | ORAL_TABLET | ORAL | 0 refills | Status: DC

## 2021-08-07 NOTE — Progress Notes (Signed)
Primary Physician/Referring:  Vicenta Aly, FNP  Patient ID: Trevor Aguilar, male    DOB: Jul 28, 1935, 86 y.o.   MRN: [id number]  Chief Complaint  Patient presents with   Hypertension   Coronary Artery Disease   Atrial Fibrillation   Follow-up    1 year   HPI:    Trevor Aguilar  is a 86 y.o. Caucasian male patient  with CAD S/P CABG and mitral repair on 05/25/2014 with placement of a 84 mm annuloplasty ring along with LIMA to LAD and SVG to RCA by Dr. Darylene Price. PAF, chronic frequent PVCs and bradycardia that is asymptomatic and chronic leg edema. Past medical history significant for hypertension, hyperlipidemia, stage III chronic kidney disease.  He is presently doing well, tolerating all his medications well and he has not had any dyspnea, PND or orthopnea, he has minimal leg edema that is stable.  No bleeding diathesis on Eliquis.  Past Medical History:  Diagnosis Date   Acute on chronic diastolic congestive heart failure ([place]) 05/18/2014   Aortic valve sclerosis 05/11/2014   Aortic valve sclerosis w/out stenosis   Bilateral cataracts    Chronic diastolic congestive heart failure ([place]) 05/18/2014   Coronary artery disease 05/12/2014   High grade proximal LAD stenosis and moderate RCA stenosis   Glaucoma    Hearing loss    bilateral hearing aids   Heart murmur    High cholesterol    History of skull fracture 1978   lower skull   Hypercholesteremia    Hypertension    Mitral regurgitation 05/12/2014   Mitral valve prolapse with flail segment of posterior leaflet and severe mitral regurgitation    Mitral regurgitation 05/10/2014   Mitral valve prolapse with flail segment of posterior leaflet and severe mitral regurgitation    Nonspecific abnormal electrocardiogram (ECG) (EKG)    Pt states he had a cardiac workup and passed all tests (Echo and Stress)   Pneumonia 1967   Pulmonary hypertension ([place]) 05/12/2014   Rash, skin Dec 2015   near buttocks; PCP aware    S/P  CABG x 2 05/25/2014   LIMA to LAD, SVG to RCA, EVH via bilateral thighs    S/P mitral valve repair 05/25/2014   Complex valvuloplasty including triangular resection of flail segment of posterior leaflet, artificial Goretex neochord placement x2 and 28 mm Sorin Memo 3D Rechord ring annuloplasty    Shortness of breath dyspnea    with ambulation   Tricuspid regurgitation 05/02/2014   Moderate TR by transthoracic ECHO   Umbilical hernia     Social History   Tobacco Use   Smoking status: Former    Packs/day: 1.00    Years: 50.00    Pack years: 50.00    Types: Cigarettes    Quit date: 03/01/1983    Years since quitting: 38.4   Smokeless tobacco: Never  Substance Use Topics   Alcohol use: Yes    Alcohol/week: 21.0 standard drinks    Types: 21 Standard drinks or equivalent per week    Comment: 2 - 4 glasses of scotch daily   ROS   Review of Systems  Cardiovascular:  Positive for leg swelling (improved). Negative for chest pain and dyspnea on exertion.  Gastrointestinal:  Negative for melena.   Objective  Blood pressure (!) 157/52, pulse (!) 56, temperature 98.2 F (36.8 C), temperature source Temporal, resp. rate 16, height 6' (1.829 m), weight 198 lb 3.2 oz (89.9 kg), SpO2 97 %.  Vitals with BMI 08/07/2021 08/06/2020 08/05/2019  Height 6\' 0"  6\' 0"  6\' 0"   Weight 198 lbs 3 oz 188 lbs 178 lbs 2 oz  BMI 26.87 123456 0000000  Systolic [id number]  Diastolic 52 63 63  Pulse 56 50 38     Physical Exam Constitutional:      General: He is not in acute distress.    Appearance: He is well-developed.  Neck:     Vascular: No JVD.  Cardiovascular:     Rate and Rhythm: Regular rhythm. Bradycardia present. No extrasystoles are present.    Pulses:          Carotid pulses are 2+ on the right side and 2+ on the left side.      Popliteal pulses are 1+ on the right side and 1+ on the left side.       Dorsalis pedis pulses are 0 on the right side and 0 on the left side.       Posterior tibial  pulses are 0 on the right side and 0 on the left side.     Heart sounds: Normal heart sounds. No murmur heard.   No gallop.     Comments: Right lower extremity  2+ pitting bilateral ankle edema,. Pulmonary:     Effort: Pulmonary effort is normal.     Breath sounds: Normal breath sounds.  Abdominal:     General: Bowel sounds are normal.     Palpations: Abdomen is soft.   Laboratory examination:   CMP Latest Ref Rng & Units 08/06/2020 08/17/2019 12/31/2018  Glucose 65 - 99 mg/dL 103(H) 104(H) 105(H)  BUN 8 - 27 mg/dL 15 16 13   Creatinine 0.76 - 1.27 mg/dL 1.24 1.18 1.06  Sodium 134 - 144 mmol/L 142 139 140  Potassium 3.5 - 5.2 mmol/L 4.5 4.3 4.7  Chloride 96 - 106 mmol/L 102 101 101  CO2 20 - 29 mmol/L 24 26 26   Calcium 8.6 - 10.2 mg/dL 9.5 9.4 9.6  Total Protein 6.0 - 8.5 g/dL 7.0 7.0 6.5  Total Bilirubin 0.0 - 1.2 mg/dL 1.0 1.4(H) 1.6(H)  Alkaline Phos 44 - 121 IU/L 105 106 116  AST 0 - 40 IU/L 26 27 27   ALT 0 - 44 IU/L 23 25 28    CBC Latest Ref Rng & Units 08/06/2020 08/17/2019 12/31/2018  WBC 3.4 - 10.8 x10E3/uL 7.5 6.8 6.5  Hemoglobin 13.0 - 17.7 g/dL 14.0 14.2 13.8  Hematocrit 37.5 - 51.0 % 41.2 43.1 41.1  Platelets 150 - 450 x10E3/uL 193 183 181   Lipid Panel     Component Value Date/Time   CHOL 125 08/06/2020 1147   TRIG 90 08/06/2020 1147   HDL 36 (L) 08/06/2020 1147   LDLCALC 72 08/06/2020 1147   HEMOGLOBIN A1C Lab Results  Component Value Date   HGBA1C 6.1 (H) 05/23/2014   MPG 128 (H) 05/23/2014   TSH No results for input(s): TSH in the last 8760 hours.  External labs :  Scheduled by PCP middle of March 2023  Medications and allergies   Allergies  Allergen Reactions   Restoril [Temazepam] Other (See Comments)    BAD DREAMS    Current Outpatient Medications on File Prior to Visit  Medication Sig Dispense Refill   ELIQUIS 5 MG TABS tablet TAKE 1 TABLET TWICE DAILY 180 tablet 1   hydrocortisone 2.5 % cream Apply topically.     metoprolol succinate  (TOPROL-XL) 25 MG 24 hr tablet TAKE 1 TABLET EVERY DAY 90 tablet 3   simvastatin (ZOCOR) 40  MG tablet TAKE 1 TABLET AT BEDTIME 90 tablet 1   triamcinolone cream (KENALOG) 0.1 % Apply 1 application topically as needed.     prednisoLONE acetate (PRED FORTE) 1 % ophthalmic suspension Place 1 drop into the left eye 4 (four) times daily.     No current facility-administered medications on file prior to visit.    Radiology:   No results found.  Cardiac Studies:    Echo- 05/22/2016 1. Left ventricle cavity is normal in size. Mild decrease in global wall motion. Visual EF is 45-50%. Diastolic function can't be assessed properly due to mitral annulus calcification, valve repair ring and mitral stenosis Calculated EF 51%. 2. Left atrial cavity is mildly dilated. 3. Trace aortic regurgitation. Mild aortic valve leaflet calcification. Mildly restricted aortic valve leaflets. No evidence of aortic valve stenosis. 4. Trace mitral regurgitation. Moderate calcification of the mitral valve annulus. Echoes of mitral valve repair ring are seen. Mild mitral valve stenosis, mean mitral gradient 3 mm of Hg, calculated valve area 1.4 cm2. 5. Mild to moderate tricuspid regurgitation. Mild to moderate pulmonary hypertension with approx. PA syst. pressure of 42 mm of Hg. 6. No diagnostic change c.f. echo. of 06/21/2014.  CABG and mitral valve repair on 05/25/2014 with triangular resection of posterior leaflet, artificial Gore-Tex neochord placement 2, Sorin Memo 3D 28 mm annuloplasty ring placement, LIMA to LAD and SVG to RCA by Dr. Lilly Cove.  EKG:  EKG 08/07/2021: Sinus bradycardia with first-degree AV block at rate of 46 bpm, diffuse inferior and anterolateral ST depression, nondiagnostic.  Normal QT interval.  Compared to 08/06/2020, frequent PVCs in bigeminal pattern not present.  No change in the ST segments.   Assessment     ICD-10-CM   1. Paroxysmal atrial fibrillation (HCC)  I48.0 EKG 12-Lead    2.  Coronary artery disease involving native coronary artery of native heart without angina pectoris  I25.10     3. S/P MVR (mitral valve repair)  Z98.890     4. Stage 3a chronic kidney disease (HCC)  N18.31     5. Primary hypertension  I10 olmesartan-hydrochlorothiazide (BENICAR HCT) 20-12.5 MG tablet     CHA2DS2-VASc Score is 4.  Yearly risk of stroke: 4.8% (A, HTN, Vasc Dz).  Score of 1=0.6; 2=2.2; 3=3.2; 4=4.8; 5=7.2; 6=9.8; 7=>9.8) -(CHF; HTN; vasc disease DM,  Male = 1; Age <65 =0; 65-74 = 1,  >75 =2; stroke/embolism= 2).   Meds ordered this encounter  Medications   olmesartan-hydrochlorothiazide (BENICAR HCT) 20-12.5 MG tablet    Sig: Take 1 tablet by mouth every morning.    Dispense:  30 tablet    Refill:  0    Send refill Rx to Vicenta Aly, NP    There are no discontinued medications.  Orders Placed This Encounter  Procedures   EKG 12-Lead    Recommendations:   Mirza Robel  is a  86 y.o. Caucasian male patient  with CAD S/P CABG and mitral repair on 05/25/2014 with placement of a 84 mm annuloplasty ring along with LIMA to LAD and SVG to RCA, PAF, chronic frequent PVCs and bradycardia that is asymptomatic and chronic leg edema. Past medical history significant for hypertension, hyperlipidemia, stage III chronic kidney disease.  He remains asymptomatic, no change in his physical exam.  No clinical evidence of heart failure and no recurrence of angina pectoris.  No change in mild bilateral ankle edema.  His blood pressure is elevated today, I rechecked the blood pressure even after 30 minutes  blood pressure remains elevated.  In view of stage IIIa chronic kidney disease, for cardiovascular protection, I have added Benicar HCT 20/12.5 mg in the morning, he has a appointment to see Ms. Vicenta Aly on 08/21/2021, at that time he will be obtaining routine blood work for his annual physical.  I would recommend that if renal function has remained stable, in view of underlying  bradycardia, we could even reduce the dose of metoprolol succinate from 25 mg to 12.5 mg daily and could potentially add amlodipine if blood pressure is not well controlled.  Otherwise stable from cardiac standpoint, I will see him back on an annual basis.   Adrian Prows, MD, [facility] 08/07/2021, 10:38 AM Office: [phone] Pager: [phone]

## 2021-09-05 ENCOUNTER — Other Ambulatory Visit: Payer: Self-pay | Admitting: Cardiology

## 2021-09-17 ENCOUNTER — Other Ambulatory Visit: Payer: Self-pay | Admitting: Cardiology

## 2021-09-17 ENCOUNTER — Encounter: Payer: Self-pay | Admitting: Cardiology

## 2021-09-17 DIAGNOSIS — I4891 Unspecified atrial fibrillation: Secondary | ICD-10-CM

## 2021-09-17 DIAGNOSIS — Z5181 Encounter for therapeutic drug level monitoring: Secondary | ICD-10-CM

## 2021-09-17 MED ORDER — APIXABAN 5 MG PO TABS: 5.0000 mg | ORAL_TABLET | Freq: Two times a day (BID) | ORAL | 3 refills | Status: DC

## 2021-09-20 ENCOUNTER — Other Ambulatory Visit: Payer: Self-pay | Admitting: Cardiology

## 2021-09-20 DIAGNOSIS — I10 Essential (primary) hypertension: Secondary | ICD-10-CM

## 2021-10-28 ENCOUNTER — Encounter: Payer: Self-pay | Admitting: Podiatry

## 2021-10-28 ENCOUNTER — Ambulatory Visit: Payer: Medicare HMO | Admitting: Podiatry

## 2021-10-28 DIAGNOSIS — M79674 Pain in right toe(s): Secondary | ICD-10-CM

## 2021-10-28 DIAGNOSIS — L84 Corns and callosities: Secondary | ICD-10-CM | POA: Diagnosis not present

## 2021-10-28 DIAGNOSIS — B351 Tinea unguium: Secondary | ICD-10-CM

## 2021-10-28 DIAGNOSIS — M79675 Pain in left toe(s): Secondary | ICD-10-CM

## 2021-10-28 DIAGNOSIS — D689 Coagulation defect, unspecified: Secondary | ICD-10-CM

## 2021-10-28 NOTE — Progress Notes (Signed)
This patient returns to my office for at risk foot care.  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  This patient is unable to cut  his painful callus left foot.  This callus is painful walking and wearing his shoes.Nails are painful walking and wearing his shoes. This patient presents for at risk foot care today.  General Appearance  Alert, conversant and in no acute stress.  Vascular  Dorsalis pedis pulses are not palpable  bilateral.  posterior tibial  pulses are weakly  palpable  bilaterally.  Capillary return is within normal limits  bilaterally. Cold feet.  Venous stasis feet  B/L.  bilaterally.  Neurologic  Senn-Weinstein monofilament wire test within normal limits  bilaterally. Muscle power within normal limits bilaterally.  Nails Thick disfigured discolored nails with subungual debris  from hallux to fifth toes bilaterally. No evidence of bacterial infection or drainage bilaterally.  Orthopedic  No limitations of motion  feet .  No crepitus or effusions noted.  No bony pathology or digital deformities noted. Plantar flex fifth metatarsal left foot.  DJD 1st MPJ  B/L.  Skin  normotropic skin noted bilaterally.  No signs of infections or ulcers noted.  Symptomatic callus sub 5th met left foot.   Callus left foot  Consent was obtained for treatment procedures.   Debride callus with # 15 blade.  As a courtesy his nails were debrided.   Return office visit   3 months                  Told patient to return for periodic foot care and evaluation due to potential at risk complications.   Gardiner Barefoot DPM

## 2021-11-23 ENCOUNTER — Other Ambulatory Visit: Payer: Self-pay | Admitting: Cardiology

## 2022-01-28 ENCOUNTER — Ambulatory Visit: Payer: Medicare HMO | Admitting: Podiatry

## 2022-01-28 ENCOUNTER — Encounter: Payer: Self-pay | Admitting: Podiatry

## 2022-01-28 DIAGNOSIS — D689 Coagulation defect, unspecified: Secondary | ICD-10-CM

## 2022-01-28 DIAGNOSIS — B351 Tinea unguium: Secondary | ICD-10-CM

## 2022-01-28 DIAGNOSIS — M79675 Pain in left toe(s): Secondary | ICD-10-CM | POA: Diagnosis not present

## 2022-01-28 DIAGNOSIS — M79674 Pain in right toe(s): Secondary | ICD-10-CM

## 2022-01-28 DIAGNOSIS — L84 Corns and callosities: Secondary | ICD-10-CM | POA: Diagnosis not present

## 2022-01-28 DIAGNOSIS — L989 Disorder of the skin and subcutaneous tissue, unspecified: Secondary | ICD-10-CM | POA: Diagnosis not present

## 2022-01-28 NOTE — Progress Notes (Signed)
This patient returns to my office for at risk foot care.  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  This patient is unable to cut  his painful callus left foot.  This callus is painful walking and wearing his shoes.Nails are painful walking and wearing his shoes. This patient presents for at risk foot care today.  General Appearance  Alert, conversant and in no acute stress.  Vascular  Dorsalis pedis pulses are not palpable  bilateral.  posterior tibial  pulses are weakly  palpable  bilaterally.  Capillary return is within normal limits  bilaterally. Cold feet.  Venous stasis feet  B/L.  bilaterally.  Neurologic  Senn-Weinstein monofilament wire test within normal limits  bilaterally. Muscle power within normal limits bilaterally.  Nails Thick disfigured discolored nails with subungual debris  from hallux to fifth toes bilaterally. No evidence of bacterial infection or drainage bilaterally.  Orthopedic  No limitations of motion  feet .  No crepitus or effusions noted.  No bony pathology or digital deformities noted. Plantar flex fifth metatarsal left foot.  DJD 1st MPJ  B/L.  Skin  normotropic skin noted bilaterally.  No signs of infections or ulcers noted.  Symptomatic callus sub 5th met left foot.   Callus left foot  Consent was obtained for treatment procedures.   Debride callus with # 15 blade.  As a courtesy his nails were debrided.   Return office visit   3 months                  Told patient to return for periodic foot care and evaluation due to potential at risk complications.   Gardiner Barefoot DPM

## 2022-04-12 ENCOUNTER — Other Ambulatory Visit: Payer: Self-pay | Admitting: Cardiology

## 2022-05-23 ENCOUNTER — Other Ambulatory Visit: Payer: Self-pay

## 2022-05-23 DIAGNOSIS — I4891 Unspecified atrial fibrillation: Secondary | ICD-10-CM

## 2022-05-23 MED ORDER — APIXABAN 5 MG PO TABS: 5.0000 mg | ORAL_TABLET | Freq: Two times a day (BID) | ORAL | 3 refills | Status: DC

## 2022-06-10 ENCOUNTER — Ambulatory Visit: Payer: Medicare HMO | Admitting: Podiatry

## 2022-06-10 ENCOUNTER — Encounter: Payer: Self-pay | Admitting: Podiatry

## 2022-06-10 DIAGNOSIS — M79675 Pain in left toe(s): Secondary | ICD-10-CM

## 2022-06-10 DIAGNOSIS — D689 Coagulation defect, unspecified: Secondary | ICD-10-CM | POA: Diagnosis not present

## 2022-06-10 DIAGNOSIS — M79674 Pain in right toe(s): Secondary | ICD-10-CM

## 2022-06-10 DIAGNOSIS — B351 Tinea unguium: Secondary | ICD-10-CM | POA: Diagnosis not present

## 2022-06-10 DIAGNOSIS — L84 Corns and callosities: Secondary | ICD-10-CM | POA: Diagnosis not present

## 2022-06-10 DIAGNOSIS — L989 Disorder of the skin and subcutaneous tissue, unspecified: Secondary | ICD-10-CM | POA: Diagnosis not present

## 2022-06-10 NOTE — Progress Notes (Signed)
This patient returns to my office for at risk foot care.  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  This patient is unable to cut  his painful callus left foot.  This callus is painful walking and wearing his shoes.Nails are painful walking and wearing his shoes. This patient presents for at risk foot care today.  General Appearance  Alert, conversant and in no acute stress.  Vascular  Dorsalis pedis pulses are not palpable  bilateral.  posterior tibial  pulses are weakly  palpable  bilaterally.  Capillary return is within normal limits  bilaterally. Cold feet.  Venous stasis feet  B/L.  bilaterally.  Neurologic  Senn-Weinstein monofilament wire test within normal limits  bilaterally. Muscle power within normal limits bilaterally.  Nails Thick disfigured discolored nails with subungual debris  from hallux to fifth toes bilaterally. No evidence of bacterial infection or drainage bilaterally.  Orthopedic  No limitations of motion  feet .  No crepitus or effusions noted.  No bony pathology or digital deformities noted. Plantar flex fifth metatarsal left foot.  DJD 1st MPJ  B/L.  Skin  normotropic skin noted bilaterally.  No signs of infections or ulcers noted.  Symptomatic callus sub 5th met B/L.  Callus left foot   Onychomycosis  B/L.  Consent was obtained for treatment procedures.   Debride callus with # 15 blade.  As a courtesy his nails were debrided.   Return office visit   3 months                  Told patient to return for periodic foot care and evaluation due to potential at risk complications.   Gardiner Barefoot DPM

## 2022-08-08 ENCOUNTER — Ambulatory Visit: Payer: Medicare HMO | Admitting: Cardiology

## 2022-08-08 ENCOUNTER — Encounter: Payer: Self-pay | Admitting: Cardiology

## 2022-08-08 VITALS — BP 147/69 | HR 79 | Resp 16 | Ht 72.0 in | Wt 201.6 lb

## 2022-08-08 DIAGNOSIS — Z9889 Other specified postprocedural states: Secondary | ICD-10-CM

## 2022-08-08 DIAGNOSIS — I480 Paroxysmal atrial fibrillation: Secondary | ICD-10-CM

## 2022-08-08 DIAGNOSIS — I2510 Atherosclerotic heart disease of native coronary artery without angina pectoris: Secondary | ICD-10-CM

## 2022-08-08 NOTE — Progress Notes (Signed)
Primary Physician/Referring:  Vicenta Aly, FNP  Patient ID: Trevor Aguilar, male    DOB: 1935-11-04, 87 y.o.   MRN: [id number]  No chief complaint on file.  HPI:    Trevor Aguilar  is a 87 y.o. Caucasian male patient  with CAD S/P CABG and mitral repair on 05/25/2014 with placement of a 28 mm annuloplasty ring along with LIMA to LAD and SVG to RCA by Dr. Darylene Price. PAF, chronic frequent PVCs and bradycardia that is asymptomatic and chronic leg edema. Past medical history significant for hypertension, hyperlipidemia, stage III chronic kidney disease.  He is presently doing well, tolerating all his medications well and he has not had any dyspnea, PND or orthopnea, he has minimal leg edema that is stable.  No bleeding diathesis on Eliquis.  She was very perturbed today as he had issues with his right.  Past Medical History:  Diagnosis Date   Acute on chronic diastolic congestive heart failure ([place]) 05/18/2014   Aortic valve sclerosis 05/11/2014   Aortic valve sclerosis w/out stenosis   Bilateral cataracts    Chronic diastolic congestive heart failure ([place]) 05/18/2014   Coronary artery disease 05/12/2014   High grade proximal LAD stenosis and moderate RCA stenosis   Glaucoma    Hearing loss    bilateral hearing aids   Heart murmur    High cholesterol    History of skull fracture 1978   lower skull   Hypercholesteremia    Hypertension    Mitral regurgitation 05/12/2014   Mitral valve prolapse with flail segment of posterior leaflet and severe mitral regurgitation    Mitral regurgitation 05/10/2014   Mitral valve prolapse with flail segment of posterior leaflet and severe mitral regurgitation    Nonspecific abnormal electrocardiogram (ECG) (EKG)    Pt states he had a cardiac workup and passed all tests (Echo and Stress)   Pneumonia 1967   Pulmonary hypertension ([place]) 05/12/2014   Rash, skin Dec 2015   near buttocks; PCP aware    S/P CABG x 2 05/25/2014   LIMA to LAD, SVG to  RCA, EVH via bilateral thighs    S/P mitral valve repair 05/25/2014   Complex valvuloplasty including triangular resection of flail segment of posterior leaflet, artificial Goretex neochord placement x2 and 28 mm Sorin Memo 3D Rechord ring annuloplasty    Shortness of breath dyspnea    with ambulation   Tricuspid regurgitation 05/02/2014   Moderate TR by transthoracic ECHO   Umbilical hernia     Social History   Tobacco Use   Smoking status: Former    Packs/day: 1.00    Years: 50.00    Total pack years: 50.00    Types: Cigarettes    Quit date: 03/01/1983    Years since quitting: 39.4   Smokeless tobacco: Never  Substance Use Topics   Alcohol use: Yes    Alcohol/week: 21.0 standard drinks of alcohol    Types: 21 Standard drinks or equivalent per week    Comment: 2 - 4 glasses of scotch daily   ROS   Review of Systems  Cardiovascular:  Positive for leg swelling (improved). Negative for chest pain and dyspnea on exertion.  Gastrointestinal:  Negative for melena.    Objective  There were no vitals taken for this visit.     08/07/2021    9:52 AM 08/06/2020   10:19 AM 08/05/2019   10:45 AM  Vitals with BMI  Height '6\' 0"'$  '6\' 0"'$  '6\' 0"'$   Weight 198 lbs  3 oz 188 lbs 178 lbs 2 oz  BMI 26.87 123456 0000000  Systolic [id number]  Diastolic 52 63 63  Pulse 56 50 38     Physical Exam Constitutional:      General: He is not in acute distress.    Appearance: He is well-developed.  Neck:     Vascular: No JVD.  Cardiovascular:     Rate and Rhythm: Regular rhythm. Bradycardia present. No extrasystoles are present.    Pulses:          Carotid pulses are 2+ on the right side and 2+ on the left side.      Popliteal pulses are 1+ on the right side and 1+ on the left side.       Dorsalis pedis pulses are 0 on the right side and 0 on the left side.       Posterior tibial pulses are 0 on the right side and 0 on the left side.     Heart sounds: Normal heart sounds. No murmur heard.    No  gallop.     Comments: Right lower extremity  2+ pitting bilateral ankle edema,. Pulmonary:     Effort: Pulmonary effort is normal.     Breath sounds: Normal breath sounds.  Abdominal:     General: Bowel sounds are normal.     Palpations: Abdomen is soft.    Laboratory examination:      Latest Ref Rng & Units 08/06/2020   11:47 AM 08/17/2019   10:41 AM 12/31/2018   10:02 AM  CMP  Glucose 65 - 99 mg/dL 103  104  105   BUN 8 - 27 mg/dL '15  16  13   '$ Creatinine 0.76 - 1.27 mg/dL 1.24  1.18  1.06   Sodium 134 - 144 mmol/L 142  139  140   Potassium 3.5 - 5.2 mmol/L 4.5  4.3  4.7   Chloride 96 - 106 mmol/L 102  101  101   CO2 20 - 29 mmol/L '24  26  26   '$ Calcium 8.6 - 10.2 mg/dL 9.5  9.4  9.6   Total Protein 6.0 - 8.5 g/dL 7.0  7.0  6.5   Total Bilirubin 0.0 - 1.2 mg/dL 1.0  1.4  1.6   Alkaline Phos 44 - 121 IU/L 105  106  116   AST 0 - 40 IU/L '26  27  27   '$ ALT 0 - 44 IU/L '23  25  28       '$ Latest Ref Rng & Units 08/06/2020   11:47 AM 08/17/2019   10:41 AM 12/31/2018   10:02 AM  CBC  WBC 3.4 - 10.8 x10E3/uL 7.5  6.8  6.5   Hemoglobin 13.0 - 17.7 g/dL 14.0  14.2  13.8   Hematocrit 37.5 - 51.0 % 41.2  43.1  41.1   Platelets 150 - 450 x10E3/uL 193  183  181    Lipid Panel     Component Value Date/Time   CHOL 125 08/06/2020 1147   TRIG 90 08/06/2020 1147   HDL 36 (L) 08/06/2020 1147   LDLCALC 72 08/06/2020 1147   HEMOGLOBIN A1C Lab Results  Component Value Date   HGBA1C 6.1 (H) 05/23/2014   MPG 128 (H) 05/23/2014   TSH No results for input(s): "TSH" in the last 8760 hours.  External labs :  Scheduled by PCP middle of March 2023  Medications and allergies   Allergies  Allergen Reactions   Restoril [  Temazepam] Other (See Comments)    BAD DREAMS    Current Outpatient Medications on File Prior to Visit  Medication Sig Dispense Refill   apixaban (ELIQUIS) 5 MG TABS tablet Take 1 tablet (5 mg total) by mouth 2 (two) times daily. 180 tablet 3   hydrocortisone 2.5 % cream  Apply topically.     metoprolol succinate (TOPROL-XL) 25 MG 24 hr tablet TAKE 1 TABLET EVERY DAY 90 tablet 3   olmesartan-hydrochlorothiazide (BENICAR HCT) 20-12.5 MG tablet TAKE 1 TABLET EVERY MORNING 30 tablet 0   prednisoLONE acetate (PRED FORTE) 1 % ophthalmic suspension Place 1 drop into the left eye 4 (four) times daily.     simvastatin (ZOCOR) 40 MG tablet TAKE 1 TABLET AT BEDTIME 90 tablet 10   triamcinolone cream (KENALOG) 0.1 % Apply 1 application topically as needed.     No current facility-administered medications on file prior to visit.    Radiology:   No results found.  Cardiac Studies:    Echo- 05/22/2016 1. Left ventricle cavity is normal in size. Mild decrease in global wall motion. Visual EF is 45-50%. Diastolic function can't be assessed properly due to mitral annulus calcification, valve repair ring and mitral stenosis Calculated EF 51%. 2. Left atrial cavity is mildly dilated. 3. Trace aortic regurgitation. Mild aortic valve leaflet calcification. Mildly restricted aortic valve leaflets. No evidence of aortic valve stenosis. 4. Trace mitral regurgitation. Moderate calcification of the mitral valve annulus. Echoes of mitral valve repair ring are seen. Mild mitral valve stenosis, mean mitral gradient 3 mm of Hg, calculated valve area 1.4 cm2. 5. Mild to moderate tricuspid regurgitation. Mild to moderate pulmonary hypertension with approx. PA syst. pressure of 42 mm of Hg. 6. No diagnostic change c.f. echo. of 06/21/2014.  CABG and mitral valve repair on 05/25/2014 with triangular resection of posterior leaflet, artificial Gore-Tex neochord placement 2, Sorin Memo 3D 28 mm annuloplasty ring placement, LIMA to LAD and SVG to RCA by Dr. Lilly Cove.  EKG:  EKG 08/07/2021: Sinus bradycardia with first-degree AV block at rate of 46 bpm, diffuse inferior and anterolateral ST depression, nondiagnostic.  Normal QT interval.  Compared to 08/06/2020, frequent PVCs in bigeminal pattern  not present.  No change in the ST segments.   Assessment     ICD-10-CM   1. Paroxysmal atrial fibrillation (HCC)  I48.0     2. Coronary artery disease involving native coronary artery of native heart without angina pectoris  I25.10     3. S/P MVR (mitral valve repair)  Z98.890      CHA2DS2-VASc Score is 4.  Yearly risk of stroke: 4.8% (A, HTN, Vasc Dz).  Score of 1=0.6; 2=2.2; 3=3.2; 4=4.8; 5=7.2; 6=9.8; 7=>9.8) -(CHF; HTN; vasc disease DM,  Male = 1; Age <65 =0; 65-74 = 1,  >75 =2; stroke/embolism= 2).   No orders of the defined types were placed in this encounter.   There are no discontinued medications.  No orders of the defined types were placed in this encounter.   Recommendations:   Seve Schlangen  is a  87 y.o. Caucasian male patient  with CAD S/P CABG and mitral repair on 05/25/2014 with placement of a 28 mm annuloplasty ring along with LIMA to LAD and SVG to RCA, PAF, chronic frequent PVCs and bradycardia that is asymptomatic and chronic leg edema. Past medical history significant for hypertension, hyperlipidemia, stage III chronic kidney disease.  1. Paroxysmal atrial fibrillation (HCC) He has been on anticoagulation and is tolerating this  well without any bleeding diathesis.  As Vicenta Aly, NP has retired, he has not established with another PCP, he plans to establish with [facility] closer to his home on [address].  He has not had any labs, he did not want me to place any lab orders as he plans to have this done next month.  2. Coronary artery disease involving native coronary artery of native heart without angina pectoris He remains asymptomatic, no change in his physical exam.  No clinical evidence of heart failure and no recurrence of angina pectoris.  No change in mild bilateral ankle edema.  3. S/P MVR (mitral valve repair) No change in physical exam.  In view of technical difficulty, we had not performed any EKG today.  I would like to see him back in 3 months  for follow-up and like to follow-up on his blood work as well otherwise blood pressure is well-controlled, he remains asymptomatic.  Only to follow-up on his renal function as well.  If he remains stable I will continue to follow him on annual basis.   His blood pressure is elevated today however I am seeing him back again in 3 months.  He has had excellent control of blood pressure previously.  I do not want to make any changes as he was slightly perturbed today and very anxious due to issues with his ride.   Adrian Prows, MD, [facility] 08/08/2022, 3:33 PM Office: [phone] Pager: [phone]

## 2022-09-09 ENCOUNTER — Encounter: Payer: Self-pay | Admitting: Podiatry

## 2022-09-09 ENCOUNTER — Ambulatory Visit: Payer: Medicare HMO | Admitting: Podiatry

## 2022-09-09 DIAGNOSIS — L989 Disorder of the skin and subcutaneous tissue, unspecified: Secondary | ICD-10-CM | POA: Diagnosis not present

## 2022-09-09 DIAGNOSIS — B351 Tinea unguium: Secondary | ICD-10-CM

## 2022-09-09 DIAGNOSIS — M79674 Pain in right toe(s): Secondary | ICD-10-CM

## 2022-09-09 DIAGNOSIS — M79675 Pain in left toe(s): Secondary | ICD-10-CM

## 2022-09-09 NOTE — Progress Notes (Signed)
This patient returns to my office for at risk foot care.  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  This patient is unable to cut  his painful callus left foot.  This callus is painful walking and wearing his shoes.Nails are painful walking and wearing his shoes. This patient presents for at risk foot care today.  General Appearance  Alert, conversant and in no acute stress.  Vascular  Dorsalis pedis pulses are not palpable  bilateral.  posterior tibial  pulses are weakly  palpable  bilaterally.  Capillary return is within normal limits  bilaterally. Cold feet.  Venous stasis feet  B/L.  bilaterally.  Neurologic  Senn-Weinstein monofilament wire test within normal limits  bilaterally. Muscle power within normal limits bilaterally.  Nails Thick disfigured discolored nails with subungual debris  from hallux to fifth toes bilaterally. No evidence of bacterial infection or drainage bilaterally.  Orthopedic  No limitations of motion  feet .  No crepitus or effusions noted.  No bony pathology or digital deformities noted. Plantar flex fifth metatarsal left foot.  DJD 1st MPJ  B/L.  Skin  normotropic skin noted bilaterally.  No signs of infections or ulcers noted.  Symptomatic callus sub 5th met B/L.  Callus left foot   Onychomycosis  B/L.  Consent was obtained for treatment procedures.   Debride callus with # 15 blade.  As a courtesy his nails were debrided.   Return office visit   3 months                  Told patient to return for periodic foot care and evaluation due to potential at risk complications.   Chevelle Durr DPM   

## 2022-12-09 ENCOUNTER — Ambulatory Visit: Payer: Medicare HMO | Admitting: Podiatry

## 2022-12-09 ENCOUNTER — Encounter: Payer: Self-pay | Admitting: Podiatry

## 2022-12-09 VITALS — BP 138/60

## 2022-12-09 DIAGNOSIS — M79675 Pain in left toe(s): Secondary | ICD-10-CM

## 2022-12-09 DIAGNOSIS — M79674 Pain in right toe(s): Secondary | ICD-10-CM

## 2022-12-09 DIAGNOSIS — B351 Tinea unguium: Secondary | ICD-10-CM | POA: Diagnosis not present

## 2022-12-09 NOTE — Progress Notes (Signed)
This patient returns to my office for at risk foot care.  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  This patient is unable to cut  his painful callus left foot.  This callus is painful walking and wearing his shoes.Nails are painful walking and wearing his shoes. This patient presents for at risk foot care today.  General Appearance  Alert, conversant and in no acute stress.  Vascular  Dorsalis pedis pulses are not palpable  bilateral.  posterior tibial  pulses are weakly  palpable  bilaterally.  Capillary return is within normal limits  bilaterally. Cold feet.  Venous stasis feet  B/L.  bilaterally.  Neurologic  Senn-Weinstein monofilament wire test within normal limits  bilaterally. Muscle power within normal limits bilaterally.  Nails Thick disfigured discolored nails with subungual debris  from hallux to fifth toes bilaterally. No evidence of bacterial infection or drainage bilaterally.  Orthopedic  No limitations of motion  feet .  No crepitus or effusions noted.  No bony pathology or digital deformities noted. Plantar flex fifth metatarsal left foot.  DJD 1st MPJ  B/L.  Skin  normotropic skin noted bilaterally.  No signs of infections or ulcers noted.  Symptomatic callus sub 5th met B/L.    Onychomycosis  B/L.  Consent was obtained for treatment procedures  Debride nails  B/L.   Return office visit   3 months                  Told patient to return for periodic foot care and evaluation due to potential at risk complications.   Helane Gunther DPM

## 2023-01-12 ENCOUNTER — Other Ambulatory Visit: Payer: Self-pay | Admitting: Cardiology

## 2023-03-11 ENCOUNTER — Ambulatory Visit: Payer: Medicare HMO | Admitting: Podiatry

## 2023-03-23 ENCOUNTER — Ambulatory Visit: Payer: Medicare HMO | Admitting: Podiatry

## 2023-03-23 ENCOUNTER — Encounter: Payer: Self-pay | Admitting: Podiatry

## 2023-03-23 DIAGNOSIS — M79674 Pain in right toe(s): Secondary | ICD-10-CM

## 2023-03-23 DIAGNOSIS — B351 Tinea unguium: Secondary | ICD-10-CM

## 2023-03-23 DIAGNOSIS — D689 Coagulation defect, unspecified: Secondary | ICD-10-CM | POA: Diagnosis not present

## 2023-03-23 DIAGNOSIS — L84 Corns and callosities: Secondary | ICD-10-CM | POA: Diagnosis not present

## 2023-03-23 DIAGNOSIS — M79675 Pain in left toe(s): Secondary | ICD-10-CM

## 2023-03-23 NOTE — Progress Notes (Signed)
This patient returns to my office for at risk foot care.  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  This patient is unable to cut  his painful callus left foot.  This callus is painful walking and wearing his shoes.Nails are painful walking and wearing his shoes. This patient presents for at risk foot care today.  General Appearance  Alert, conversant and in no acute stress.  Vascular  Dorsalis pedis pulses are not palpable  bilateral.  posterior tibial  pulses are weakly  palpable  bilaterally.  Capillary return is within normal limits  bilaterally. Cold feet.  Venous stasis feet  B/L.  bilaterally.  Neurologic  Senn-Weinstein monofilament wire test within normal limits  bilaterally. Muscle power within normal limits bilaterally.  Nails Thick disfigured discolored nails with subungual debris  from hallux to fifth toes bilaterally. No evidence of bacterial infection or drainage bilaterally.  Orthopedic  No limitations of motion  feet .  No crepitus or effusions noted.  No bony pathology or digital deformities noted. Plantar flex fifth metatarsal left foot.  DJD 1st MPJ  B/L.  Skin  normotropic skin noted bilaterally.  No signs of infections or ulcers noted.  Asymptomatic callus sub 5th met B/L.   Hemorrhagic callus Callus right hallux    Onychomycosis  B/L.  Callus right hallux.  Consent was obtained for treatment procedures  Debride nails  B/L.   Return office visit   3 months                  Told patient to return for periodic foot care and evaluation due to potential at risk complications.  Debride callus with dremel tool right hallux.  Padding dispensed right hallux.   Helane Gunther DPM

## 2023-05-30 ENCOUNTER — Other Ambulatory Visit: Payer: Self-pay | Admitting: Cardiology

## 2023-05-30 DIAGNOSIS — I4891 Unspecified atrial fibrillation: Secondary | ICD-10-CM

## 2023-06-01 ENCOUNTER — Telehealth: Payer: Self-pay | Admitting: Podiatry

## 2023-06-01 NOTE — Telephone Encounter (Signed)
Left v/m for pt to call me directly to see if he will be filing the same [facility] insurance for next year. Told pt in voicemail I left that if so, we would need to contact his PCP's office to get a new referral from them through [facility] for his visit in January with Dr. Stacie Acres to be covered. Told pt to leave me a voicemail if I did not answer.

## 2023-06-01 NOTE — Telephone Encounter (Signed)
Prescription refill request for Eliquis received. Indication:afib Last office visit:3/24 Scr:1.8  12/24 Age: 87 Weight:91.4  kg  Prescription refilled

## 2023-06-04 NOTE — Telephone Encounter (Signed)
Pt called back regarding my message I had left him on Monday, December 23 rd. I apologized to pt that I had not returned his voicemail he left me on Christmas Eve as I have been playing catch up. I told the pt I was calling to see if he would still be filing the same [facility] insurance for 2025 and patient stated he would be. I told him we would be contacting his PCP's office to get a referral from them through his insurance as one is required for his insurance. I went to confirm patients PCP and pt stated he now see's Dr. Tracey Harries with [facility]. I told the pt I would call this afternoon and we would have that referral when he comes for his appointment in January.

## 2023-06-04 NOTE — Telephone Encounter (Signed)
I'm returning a call from yesterday. I'm not sure what it was all about as I was unable to understand the message due to my phone. If you would, please call me back. Thank you.

## 2023-06-20 ENCOUNTER — Other Ambulatory Visit: Payer: Self-pay | Admitting: Cardiology

## 2023-06-23 ENCOUNTER — Ambulatory Visit: Payer: Medicare HMO | Admitting: Podiatry

## 2023-07-03 ENCOUNTER — Ambulatory Visit: Payer: Medicare HMO | Admitting: Podiatry

## 2023-08-04 ENCOUNTER — Encounter: Payer: Self-pay | Admitting: Podiatry

## 2023-08-04 ENCOUNTER — Ambulatory Visit: Payer: Medicare HMO | Admitting: Podiatry

## 2023-08-04 DIAGNOSIS — M79675 Pain in left toe(s): Secondary | ICD-10-CM

## 2023-08-04 DIAGNOSIS — D689 Coagulation defect, unspecified: Secondary | ICD-10-CM | POA: Diagnosis not present

## 2023-08-04 DIAGNOSIS — M79674 Pain in right toe(s): Secondary | ICD-10-CM | POA: Diagnosis not present

## 2023-08-04 DIAGNOSIS — B351 Tinea unguium: Secondary | ICD-10-CM | POA: Diagnosis not present

## 2023-08-04 DIAGNOSIS — L989 Disorder of the skin and subcutaneous tissue, unspecified: Secondary | ICD-10-CM

## 2023-08-04 NOTE — Progress Notes (Signed)
 This patient returns to my office for at risk foot care.  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  This patient is unable to cut  his painful callus left foot.  This callus is painful walking and wearing his shoes.Nails are painful walking and wearing his shoes. This patient presents for at risk foot care today.  General Appearance  Alert, conversant and in no acute stress.  Vascular  Dorsalis pedis pulses are not palpable  bilateral.  posterior tibial  pulses are weakly  palpable  bilaterally.  Capillary return is within normal limits  bilaterally. Cold feet.  Venous stasis feet  B/L.  bilaterally.  Neurologic  Senn-Weinstein monofilament wire test within normal limits  bilaterally. Muscle power within normal limits bilaterally.  Nails Thick disfigured discolored nails with subungual debris  from hallux to fifth toes bilaterally. No evidence of bacterial infection or drainage bilaterally.  Orthopedic  No limitations of motion  feet .  No crepitus or effusions noted.  No bony pathology or digital deformities noted. Plantar flex fifth metatarsal left foot.  DJD 1st MPJ  B/L.  Skin  normotropic skin noted bilaterally.  No signs of infections or ulcers noted.  symptomatic callus sub 5th met B/L.       Onychomycosis  B/L.  Callus right hallux.  Consent was obtained for treatment procedures  Debride nails  B/ L with nail nipper and dremel tool debride callus with dremel tool. Told patient to return for foot care and evaluation due to potential at risk complications.   Return office visit   3 months                  Told patient to return for periodic foot care and evaluation due to potential at risk complications.     Helane Gunther DPM

## 2023-10-12 ENCOUNTER — Other Ambulatory Visit: Payer: Self-pay | Admitting: Cardiology

## 2023-10-29 ENCOUNTER — Other Ambulatory Visit: Payer: Self-pay | Admitting: Cardiology

## 2023-10-29 MED ORDER — METOPROLOL SUCCINATE ER 25 MG PO TB24: 25.0000 mg | ORAL_TABLET | Freq: Every day | ORAL | 0 refills | Status: DC

## 2023-10-29 NOTE — Addendum Note (Signed)
 Addended by: Zamere Pasternak on: 10/29/2023 04:42 PM   Modules accepted: Orders

## 2023-11-03 ENCOUNTER — Ambulatory Visit (INDEPENDENT_AMBULATORY_CARE_PROVIDER_SITE_OTHER): Payer: Medicare HMO | Admitting: Podiatry

## 2023-11-03 ENCOUNTER — Encounter: Payer: Self-pay | Admitting: Podiatry

## 2023-11-03 DIAGNOSIS — M79674 Pain in right toe(s): Secondary | ICD-10-CM

## 2023-11-03 DIAGNOSIS — B351 Tinea unguium: Secondary | ICD-10-CM | POA: Diagnosis not present

## 2023-11-03 DIAGNOSIS — D689 Coagulation defect, unspecified: Secondary | ICD-10-CM

## 2023-11-03 DIAGNOSIS — M79675 Pain in left toe(s): Secondary | ICD-10-CM

## 2023-11-03 NOTE — Progress Notes (Signed)
 This patient returns to my office for at risk foot care.  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  This patient is unable to cut  his painful callus left foot.  This callus is painful walking and wearing his shoes.Nails are painful walking and wearing his shoes. This patient presents for at risk foot care today.  General Appearance  Alert, conversant and in no acute stress.  Vascular  Dorsalis pedis pulses are not palpable  bilateral.  posterior tibial  pulses are weakly  palpable  bilaterally.  Capillary return is within normal limits  bilaterally. Cold feet.  Venous stasis feet  B/L.  bilaterally.  Neurologic  Senn-Weinstein monofilament wire test within normal limits  bilaterally. Muscle power within normal limits bilaterally.  Nails Thick disfigured discolored nails with subungual debris  from hallux to fifth toes bilaterally. No evidence of bacterial infection or drainage bilaterally.  Orthopedic  No limitations of motion  feet .  No crepitus or effusions noted.  No bony pathology or digital deformities noted. Plantar flex fifth metatarsal left foot.  DJD 1st MPJ  B/L.  Skin  normotropic skin noted bilaterally.  No signs of infections or ulcers noted.  symptomatic callus sub 5th met B/L.       Onychomycosis  B/L.  Callus right hallux.  Consent was obtained for treatment procedures  Debride nails  B/ L with nail nipper and dremel tool debride callus with dremel tool. Told patient to return for foot care and evaluation due to potential at risk complications.   Return office visit   3 months                  Told patient to return for periodic foot care and evaluation due to potential at risk complications.     Helane Gunther DPM

## 2023-12-17 ENCOUNTER — Telehealth: Payer: Self-pay | Admitting: Cardiology

## 2023-12-17 MED ORDER — METOPROLOL SUCCINATE ER 25 MG PO TB24: 25.0000 mg | ORAL_TABLET | Freq: Every day | ORAL | 1 refills | Status: DC

## 2023-12-17 MED ORDER — SIMVASTATIN 40 MG PO TABS: 40.0000 mg | ORAL_TABLET | Freq: Every day | ORAL | 0 refills | Status: DC

## 2023-12-17 MED ORDER — METOPROLOL SUCCINATE ER 25 MG PO TB24: 25.0000 mg | ORAL_TABLET | Freq: Every day | ORAL | 0 refills | Status: AC

## 2023-12-17 NOTE — Telephone Encounter (Signed)
 [facility] - [place], [place] - [address]    This the correct pharmacy it should go to. Pt changed his mind after note was sent.

## 2023-12-17 NOTE — Telephone Encounter (Signed)
 Pt's medication was sent to pt's pharmacy as requested. Confirmation received.

## 2023-12-17 NOTE — Telephone Encounter (Signed)
 Refilled patient's metoprolol  and simvastatin . Encouraged patient to keep his office visit next month. Patient stated he would be willing to change to [facility] at [facility] for mail order if he can get everything at the same cost. Will send message to pharmacy.

## 2023-12-17 NOTE — Telephone Encounter (Signed)
*  STAT* If patient is at the pharmacy, call can be transferred to refill team.   1. Which medications need to be refilled? (please list name of each medication and dose if known)   metoprolol succinate (TOPROL-XL) 25 MG 24 hr tablet    2. Which pharmacy/location (including street and city if local pharmacy) is medication to be sent to? [facility] - [place], [place] - [address]  3. Do they need a 30 day or 90 day supply?  90 day  

## 2023-12-17 NOTE — Telephone Encounter (Signed)
 Pt c/o medication issue:  1. Name of Medication:   simvastatin  (ZOCOR ) 40 MG tablet    2. How are you currently taking this medication (dosage and times per day)?  TAKE 1 TABLET AT BEDTIME      3. Are you having a reaction (difficulty breathing--STAT)? No  4. What is your medication issue? Pt is requesting a callback regarding him wanting to know if he should still be taking this medication or not before requesting a refill. Please advise

## 2024-01-11 ENCOUNTER — Encounter (HOSPITAL_BASED_OUTPATIENT_CLINIC_OR_DEPARTMENT_OTHER): Attending: General Surgery | Admitting: General Surgery

## 2024-01-11 DIAGNOSIS — L89313 Pressure ulcer of right buttock, stage 3: Secondary | ICD-10-CM | POA: Insufficient documentation

## 2024-01-11 DIAGNOSIS — I5032 Chronic diastolic (congestive) heart failure: Secondary | ICD-10-CM | POA: Diagnosis not present

## 2024-01-11 DIAGNOSIS — L89323 Pressure ulcer of left buttock, stage 3: Secondary | ICD-10-CM | POA: Insufficient documentation

## 2024-01-11 DIAGNOSIS — R21 Rash and other nonspecific skin eruption: Secondary | ICD-10-CM | POA: Diagnosis not present

## 2024-01-25 ENCOUNTER — Encounter (HOSPITAL_BASED_OUTPATIENT_CLINIC_OR_DEPARTMENT_OTHER): Admitting: General Surgery

## 2024-01-25 DIAGNOSIS — L89313 Pressure ulcer of right buttock, stage 3: Secondary | ICD-10-CM | POA: Diagnosis not present

## 2024-02-03 ENCOUNTER — Ambulatory Visit: Admitting: Podiatry

## 2024-02-03 ENCOUNTER — Encounter: Payer: Self-pay | Admitting: Podiatry

## 2024-02-03 DIAGNOSIS — D689 Coagulation defect, unspecified: Secondary | ICD-10-CM

## 2024-02-03 DIAGNOSIS — M79675 Pain in left toe(s): Secondary | ICD-10-CM

## 2024-02-03 DIAGNOSIS — M79674 Pain in right toe(s): Secondary | ICD-10-CM

## 2024-02-03 DIAGNOSIS — B351 Tinea unguium: Secondary | ICD-10-CM

## 2024-02-03 NOTE — Progress Notes (Signed)
 This patient returns to my office for at risk foot care.  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  This patient is unable to cut  his painful callus left foot.  This callus is painful walking and wearing his shoes.Nails are painful walking and wearing his shoes. This patient presents for at risk foot care today.  General Appearance  Alert, conversant and in no acute stress.  Vascular  Dorsalis pedis pulses are not palpable  bilateral.  posterior tibial  pulses are weakly  palpable  bilaterally.  Capillary return is within normal limits  bilaterally. Cold feet.  Venous stasis feet  B/L.  bilaterally.  Neurologic  Senn-Weinstein monofilament wire test within normal limits  bilaterally. Muscle power within normal limits bilaterally.  Nails Thick disfigured discolored nails with subungual debris  from hallux to fifth toes bilaterally. No evidence of bacterial infection or drainage bilaterally.  Orthopedic  No limitations of motion  feet .  No crepitus or effusions noted.  No bony pathology or digital deformities noted. Plantar flex fifth metatarsal left foot.  DJD 1st MPJ  B/L.  Skin  normotropic skin noted bilaterally.  No signs of infections or ulcers noted.  symptomatic callus sub 5th met B/L.       Onychomycosis  B/L.  Callus right hallux.  Consent was obtained for treatment procedures  Debride nails  B/ L with nail nipper and dremel tool debride callus with dremel tool. Told patient to return for foot care and evaluation due to potential at risk complications.   Return office visit   3 months                  Told patient to return for periodic foot care and evaluation due to potential at risk complications.     Helane Gunther DPM

## 2024-02-04 ENCOUNTER — Ambulatory Visit: Admitting: Cardiology

## 2024-02-09 ENCOUNTER — Encounter (HOSPITAL_BASED_OUTPATIENT_CLINIC_OR_DEPARTMENT_OTHER): Admitting: General Surgery

## 2024-02-09 ENCOUNTER — Encounter (HOSPITAL_BASED_OUTPATIENT_CLINIC_OR_DEPARTMENT_OTHER): Attending: General Surgery | Admitting: General Surgery

## 2024-02-09 DIAGNOSIS — I5032 Chronic diastolic (congestive) heart failure: Secondary | ICD-10-CM | POA: Insufficient documentation

## 2024-02-09 DIAGNOSIS — R21 Rash and other nonspecific skin eruption: Secondary | ICD-10-CM | POA: Insufficient documentation

## 2024-02-09 DIAGNOSIS — L89313 Pressure ulcer of right buttock, stage 3: Secondary | ICD-10-CM | POA: Diagnosis present

## 2024-03-03 ENCOUNTER — Other Ambulatory Visit: Payer: Self-pay | Admitting: Cardiology

## 2024-03-05 ENCOUNTER — Other Ambulatory Visit: Payer: Self-pay | Admitting: Cardiology

## 2024-03-05 DIAGNOSIS — I4891 Unspecified atrial fibrillation: Secondary | ICD-10-CM

## 2024-03-07 MED ORDER — APIXABAN 2.5 MG PO TABS: 2.5000 mg | ORAL_TABLET | Freq: Two times a day (BID) | ORAL | 1 refills | Status: AC

## 2024-03-07 NOTE — Telephone Encounter (Addendum)
 Eliquis  5mg  refill request received. Patient is 88 years old, weight-91.4kg, Crea-1.59 on 11/18/23 via [employer] from [place], Diagnosis-Afib, and last seen by Dr. Ladona on 08/08/22 and has an appointment pending 03/11/24. Dose is inappropriate based on dosing criteria. Will have PharmD to evaluate doseage.    Spoke with patient and advised that eliquis  dose was being reduced from 5mg  to 2.5mg  twice a day. Explained that with current kidney function and age he should be on reduced dose and that our pharmacist approved/advised on this dose. He was thankful for the call. [place], pharmd has placed a note on MD appt to order bmp at 03/11/24 appt.

## 2024-03-11 ENCOUNTER — Encounter: Payer: Self-pay | Admitting: Cardiology

## 2024-03-11 ENCOUNTER — Ambulatory Visit: Attending: Cardiology | Admitting: Cardiology

## 2024-03-11 VITALS — BP 142/70 | HR 78 | Resp 16 | Ht 72.0 in | Wt 182.0 lb

## 2024-03-11 DIAGNOSIS — Z9889 Other specified postprocedural states: Secondary | ICD-10-CM

## 2024-03-11 DIAGNOSIS — I480 Paroxysmal atrial fibrillation: Secondary | ICD-10-CM | POA: Diagnosis not present

## 2024-03-11 DIAGNOSIS — I2510 Atherosclerotic heart disease of native coronary artery without angina pectoris: Secondary | ICD-10-CM | POA: Diagnosis not present

## 2024-03-11 DIAGNOSIS — N1832 Chronic kidney disease, stage 3b: Secondary | ICD-10-CM

## 2024-03-11 DIAGNOSIS — I10 Essential (primary) hypertension: Secondary | ICD-10-CM

## 2024-03-11 MED ORDER — OLMESARTAN MEDOXOMIL 20 MG PO TABS: 10.0000 mg | ORAL_TABLET | Freq: Every day | ORAL | 0 refills | Status: AC

## 2024-03-11 NOTE — Patient Instructions (Addendum)
 Medication Instructions:  Your physician has recommended you make the following change in your medication:   ** Begin Olmesartan  20mg  - 1/2 tablet by mouth daily.   *If you need a refill on your cardiac medications before your next appointment, please call your pharmacy*  Lab Work: BMET in 2 weeks If you have labs (blood work) drawn today and your tests are completely normal, you will receive your results only by: MyChart Message (if you have MyChart) OR A paper copy in the mail If you have any lab test that is abnormal or we need to change your treatment, we will call you to review the results.  Testing/Procedures: None ordered.   Follow-Up: At [facility], you and your health needs are our priority.  As part of our continuing mission to provide you with exceptional heart care, our providers are all part of one team.  This team includes your primary Cardiologist (physician) and Advanced Practice Providers or APPs (Physician Assistants and Nurse Practitioners) who all work together to provide you with the care you need, when you need it.  Your next appointment:   Follow up as needed with Dr Ladona

## 2024-03-11 NOTE — Progress Notes (Signed)
 Cardiology Office Note:  .   Date:  03/12/2024  ID:  [name], DOB [birth year], MRN [id number] PCP: Pura Lenis, MD  [facility] Providers Cardiologist:  None   History of Present Illness: Trevor Aguilar   Trevor Aguilar is a 88 y.o. Caucasian male patient with CAD S/P CABG and mitral repair on 05/25/2014 with placement of a 28 mm annuloplasty ring along with LIMA to LAD and SVG to RCA by Dr. Sudie Laine.  Past medical history significant for paroxysmal atrial fibrillation and on chronic Eliquis , chronic frequent PVCs and bradycardia that is asymptomatic and chronic leg edema. Past medical history significant for hypertension, hyperlipidemia, stage III chronic kidney disease.   He has done well since surgery and his last echocardiogram 05/22/2016 revealed excellent repair of the mitral valve with mild mitral stenosis and mild to moderate MR and mild pulm hypertension.  He has not had any hospitalization or heart failure admission.  He continues to have marked bradycardia but remains asymptomatic.  Cardiac Studies relevent.    CABG and mitral valve repair on 05/25/2014 Triangular resection of posterior leaflet, artificial Gore-Tex neochord placement 2, Sorin Memo 3D 28 mm annuloplasty ring placement, LIMA to LAD and SVG to RCA     Discussed the use of AI scribe software for clinical note transcription with the patient, who gave verbal consent to proceed.  History of Present Illness Trevor Aguilar is an 88 year old male who presents for a cardiovascular follow-up.  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 His blood pressure typically ranges between 110-120 mmHg when exercising after heart surgery. He is currently on Eliquis , with a recent dosage reduction from 5 mg to 2.5 mg, and has about three months' worth of medication left. He used to be on omisartan HCT but is unsure why it was discontinued. He takes three medications along with  eye drops. He denies any new cardiovascular concerns or symptoms.   Labs   Care everywhere/Faxed External Labs:  Labs 11/18/2023:  Total cholesterol 113, triglycerides 97, HDL 32, LDL 62.  Serum glucose 105 mg, BUN 22, creatinine 1.59, eGFR 42 mL, potassium 4.1, LFTs normal.  Hb 13.1/HCT 34.9, platelets 220.  A1c 6.0%.  ROS  Review of Systems  Cardiovascular:  Negative for chest pain, dyspnea on exertion and leg swelling.   Physical Exam:   VS:  BP (!) 142/70 (BP Location: Left Arm, Patient Position: Sitting, Cuff Size: Normal)   Pulse 78   Resp 16   Ht 6' (1.829 m)   Wt 182 lb (82.6 kg)   SpO2 96%   BMI 24.68 kg/m    Wt Readings from Last 3 Encounters:  03/11/24 182 lb (82.6 kg)  08/08/22 201 lb 9.6 oz (91.4 kg)  08/07/21 198 lb 3.2 oz (89.9 kg)    BP Readings from Last 3 Encounters:  03/11/24 (!) 142/70  12/09/22 138/60  08/08/22 (!) 147/69   Physical Exam Neck:     Vascular: No carotid bruit or JVD.  Cardiovascular:     Rate and Rhythm: Normal rate. Rhythm irregular.     Pulses:          Dorsalis pedis pulses are 0 on the right side and 0 on the left side.       Posterior tibial pulses are 0 on the right side and 0 on the left side.     Heart sounds: No murmur heard. Pulmonary:     Effort: Pulmonary effort  is normal.     Breath sounds: Normal breath sounds.  Abdominal:     General: Bowel sounds are normal.     Palpations: Abdomen is soft.  Musculoskeletal:     Right lower leg: No edema.     Left lower leg: No edema.  Skin:    Capillary Refill: Capillary refill takes less than 2 seconds.    EKG:    EKG Interpretation Date/Time:  Friday March 11 2024 14:59:45 EDT Ventricular Rate:  59 PR Interval:    QRS Duration:  96 QT Interval:  432 QTC Calculation: 427 R Axis:   63  Text Interpretation: EKG 03/11/2024: Atrial fibrillation with controlled ventricular response at the rate of 59 bpm, normal axis, incomplete right bundle branch block.  Nonspecific inferior coving ST change, PVCs (2).  Compared to 08/07/2021, sinus with first-degree AV block at rate of 46 bpm not present, diffuse inferior and anterolateral ST depression is now only noted in the inferior leads. Confirmed by Kimball Manske, Jagadeesh ([id number]) on 03/12/2024 12:23:04 PM    ASSESSMENT AND PLAN: .      ICD-10-CM   1. Coronary artery disease involving native coronary artery of native heart without angina pectoris  I25.10 EKG 12-Lead    Basic metabolic panel with GFR    Basic metabolic panel with GFR    2. Paroxysmal atrial fibrillation (HCC)  I48.0 Basic metabolic panel with GFR    Basic metabolic panel with GFR    3. S/P MVR (mitral valve repair) 05/25/2014 with placement of a 28 mm annuloplasty ring. LIMA to LAD  Z98.890     4. Stage 3b chronic kidney disease (HCC)  N18.32 Basic metabolic panel with GFR    Basic metabolic panel with GFR    5. Primary hypertension  I10 olmesartan  (BENICAR ) 20 MG tablet    Basic metabolic panel with GFR    Basic metabolic panel with GFR     Assessment and Plan Assessment & Plan Paroxysmal atrial fibrillation Current medication regimen includes Eliquis , adjusted from 5 mg to 2.5 mg. - Instruct to cut Eliquis  tablets in half for new dosage of 2.5 mg. - Advise to obtain a pill cutter from the pharmacy. - Presently in AF today but remains asymptomatic  Atherosclerotic heart disease of native coronary artery without angina pectoris - He remains asymptomatic.  - On Statin  MV Repair Mitral valve repair 10 years ago and has remained stable with no clinical evidence of any heart failure and no significant change in physical exam.  No indication for echocardiogram or further evaluation.   - Patient is a candidate for endocarditis prophylaxis.  Hypertension Current reading of 143 mmHg systolic, higher than usual range of 110-120 mmHg. - Adding olmesartan  10 mg daily.  Chronic kidney disease, stage 3b Initiation olmesartan  10 mg to  protect renal function was discussed. - Initiate new medication for kidney protection. Details to be provided by nurse. - Order BMP in 2 weeks to monitor kidney function.     Follow up: As needed as he has remained stable from cardiovascular standpoint for the past 10 years.  Signed,  Gordy Bergamo, MD, [facility] 03/12/2024, 12:27 PM [facility] [address] [place], [place] [address] Phone: [phone]. Fax:  [phone]

## 2024-03-22 ENCOUNTER — Ambulatory Visit: Payer: Self-pay | Admitting: Cardiology

## 2024-03-22 LAB — BASIC METABOLIC PANEL WITH GFR
BUN/Creatinine Ratio: 17 (ref 10–24)
BUN: 28 mg/dL — ABNORMAL HIGH (ref 8–27)
CO2: 23 mmol/L (ref 20–29)
Calcium: 9 mg/dL (ref 8.6–10.2)
Chloride: 102 mmol/L (ref 96–106)
Creatinine, Ser: 1.67 mg/dL — ABNORMAL HIGH (ref 0.76–1.27)
Glucose: 132 mg/dL — ABNORMAL HIGH (ref 70–99)
Potassium: 4.2 mmol/L (ref 3.5–5.2)
Sodium: 141 mmol/L (ref 134–144)
eGFR: 40 mL/min/1.73 — ABNORMAL LOW (ref >59)

## 2024-03-22 NOTE — Progress Notes (Signed)
 Kidney function is stable and no change from 11/18/23 or 05/20/2023

## 2024-03-24 ENCOUNTER — Other Ambulatory Visit: Payer: Self-pay | Admitting: Cardiology

## 2024-05-04 ENCOUNTER — Ambulatory Visit: Admitting: Podiatry

## 2024-05-04 ENCOUNTER — Encounter: Payer: Self-pay | Admitting: Podiatry

## 2024-05-04 DIAGNOSIS — D689 Coagulation defect, unspecified: Secondary | ICD-10-CM

## 2024-05-04 DIAGNOSIS — B351 Tinea unguium: Secondary | ICD-10-CM

## 2024-05-04 DIAGNOSIS — L84 Corns and callosities: Secondary | ICD-10-CM

## 2024-05-04 DIAGNOSIS — M79674 Pain in right toe(s): Secondary | ICD-10-CM

## 2024-05-04 DIAGNOSIS — M79675 Pain in left toe(s): Secondary | ICD-10-CM

## 2024-05-04 NOTE — Progress Notes (Signed)
 This patient returns to my office for at risk foot care.  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  This patient is unable to cut  his painful callus left foot.  This callus is painful walking and wearing his shoes.Nails are painful walking and wearing his shoes. This patient presents for at risk foot care today.  General Appearance  Alert, conversant and in no acute stress.  Vascular  Dorsalis pedis pulses are not palpable  bilateral.  posterior tibial  pulses are weakly  palpable  bilaterally.  Capillary return is within normal limits  bilaterally. Cold feet.  Venous stasis feet  B/L.  bilaterally.  Neurologic  Senn-Weinstein monofilament wire test within normal limits  bilaterally. Muscle power within normal limits bilaterally.  Nails Thick disfigured discolored nails with subungual debris  from hallux to fifth toes bilaterally. No evidence of bacterial infection or drainage bilaterally.  Orthopedic  No limitations of motion  feet .  No crepitus or effusions noted.  No bony pathology or digital deformities noted. Plantar flex fifth metatarsal left foot.  DJD 1st MPJ  B/L.  Skin  normotropic skin noted bilaterally.  No signs of infections or ulcers noted.  symptomatic callus sub 5th met right        Onychomycosis  B/L.  Callus right hallux.  Consent was obtained for treatment procedures  Debride nails  B/ L with nail nipper and dremel tool debride callus with dremel tool. Told patient to return for foot care and evaluation due to potential at risk complications.   Return office visit   3 months                  Told patient to return for periodic foot care and evaluation due to potential at risk complications.     Cordella Bold DPM

## 2024-06-01 NOTE — Progress Notes (Signed)
 06/01/2024   [facility]     Impression / Plan   1. Annual physical exam      2. Mixed hyperlipidemia  Lipid Panel   Lipid Panel    3. S/P CABG x 2      4. Paroxysmal atrial fibrillation (*)      5. Coagulation disorder (*)      6. Chronic kidney disease, stage 3a (GFR 45-59)  Comprehensive Metabolic Panel   CBC And Differential   Comprehensive Metabolic Panel   CBC And Differential    7. Prediabetes  POCT A1C   POCT A1C    8. Immunization due  Fluad Trivalent Adjuvanted    9. Weight loss     down 15 in 6 months,  attributed to healthier eating       Assessment & Plan 1. Glaucoma: - The patient reports that his left eye vision is deteriorating due to glaucoma, and he is blind in his right eye except for a small section where he can see some light. - He has had multiple surgeries to relieve eye pressure, but the pressure remains low at around 1 instead of the normal 10. - He plans to discuss further surgical options with Dr. Olinda at his next appointment.  2. Weight loss: - The patient has lost about 15 pounds over the past six months without deliberately trying to lose weight. - He attributes this to eating better and not following any specific diet. - Blood work will be ordered to ensure there are no underlying issues contributing to the weight loss.  3. Health maintenance: - The patient will receive his influenza vaccine today, as he has not yet received it this year.  Follow-up: The patient will follow up in 6 months.    Patient instructions and education given as below.  Labs ordered today and I will follow up with the patient with any additional recommendations once the results are available.    Healthy lifestyle recommendations discussed. Wellness handout given. Patient encouraged to exercise, monitor healthy diet, reduce stress when able, and take time for themselves.  Advised yearly prostate exams starting at 50, dental exams every 6  months, yearly eye exams, and dermatology exam as needed. Advise monthly self testicular exam, annual early flu immunization, colonoscopy at the age of 39, Zostavax at 50, and Pnuemovax at 47. Follow-up yearly for physical and labs unless concerns arise sooner.  Risks, benefits, and alternatives of the medications and treatment plan prescribed today were discussed, and patient expressed understanding. Plan follow-up as discussed or as needed if any new symptoms or change in chronic conditions.    Follow up in about 6 months (around 11/30/2024) for the Medicare Annual Wellness Visit, a weight check.  Subjective   Patient ID:  Trevor Aguilar is a 88 y.o. (DOB 24-Aug-1935) male.     Patient presents with   Medication Management    Patient presents for wellness visit today.  The interval history is unremarkable. Patient history, meds, immunizations, and problem list have been reviewed and discussed. Patient given opportunity to voice concerns.  History of Present Illness The patient is an 88 year old male who presents for evaluation of glaucoma, weight loss, and health maintenance.  He reports a slight blurriness in his vision, which he attributes to glaucoma. He is under the care of Dr. Olinda for this condition. 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 He has undergone three  surgeries to correct this issue, with the current pressure being approximately 1, significantly lower than the normal range of around 10. He experiences blindness in his right eye, except for a small area where he can perceive light. His left eye is also affected by glaucoma, causing a decline in vision. He describes his visual perception as akin to looking through fog. He plans to discuss the possibility of additional stitches or patches to increase the pressure during his next appointment with Dr. Olinda.  He has not received his influenza  vaccine this year, even though he typically gets it annually. He does not require any medication refills at this time. He continues to see Dr. Comer annually and Dr. Loreda for foot care. He uses hearing aids but needs new batteries. He reports no issues with chewing or swallowing food. He has not been ill in the past six months, with his last illness occurring in 2015 when he underwent heart surgery. He reports normal bowel movements and urination. He experiences intermittent swelling around his ankle.  He has experienced a weight loss of 15 pounds over the past six months, without any intentional effort. His weight was previously 210 pounds, but he now weighs 177 pounds. He attributes this weight loss to improved dietary habits.  Diet: Improved dietary habits  PAST SURGICAL HISTORY: Heart surgery in 2015 Three eye surgeries for glaucoma  Results    Allergies[1]  Current Outpatient Medications  Medication Instructions   ELIQUIS  2.5 mg, 2 times a day   hydrocortisone 2.5 % cream 2 times a day   Ketotifen Fumarate (REFRESH EYE ITCH RELIEF OP) 1 drop, As needed   olmesartan  (BENICAR ) 10 mg, Daily   simvastatin  (ZOCOR ) 40 mg, At bedtime   sodium chloride  (MURO-128 5% EYE DROPS) 5% ophthalmic solution 1 drop, 4 times a day   triamcinolone  acetonide (KENALOG ) 0.1% cream 2 times a day   Past Medical History:  Diagnosis Date   Abdominal hernia    Cataracts, bilateral    right eye   Hearing loss    Valvular heart disease 10/18/2015   Past Surgical History:  Procedure Laterality Date   Coronary artery bypass graft     Fracture surgery     facial   Tonsillectomy     Social History[2]  Family History[3] Immunization History  Administered Date(s) Administered   Influenza High Dose 04/12/2014, 03/27/2018   Influenza Quad 0.41ml single-dose vial(Fluzone) 02/24/2017   Influenza Tri 04/25/2013, 02/13/2015   Influenza, adjuvanted, trivalent, PF (Fluad) 06/01/2024    Influenza, live, trivalent, intranasal (Flumist) 02/05/2016   Moderna Covid-19 Vaccine Monovalent 145mcg/0.5ml IM 08/26/2019, 09/26/2019   Pneumococcal Polysaccharide (Pneumovax) 06/09/2009, 02/05/2016   Tdap 02/02/2012   Zoster (Zostavax) 04/01/2016   Zoster Recombinant (Shingrix) 02/24/2017   Health Maintenance  Topic Date Due   Microalbumin/Creatinine Ratio  Never done   RSV Adult and Pregnancy (1 - 1-dose 75+ series) Never done   Pneumococcal Vaccine: 50+ Years (2 of 2 - PCV) 02/04/2017   DTaP/Tdap/Td Vaccines (2 - Td or Tdap) 02/01/2022   COVID-19 Vaccine (3 - 2025-26 season) 02/08/2024   Creatinine Level  11/17/2024   Potassium Level  11/17/2024   GFR/EGFR  11/17/2024   Medicare Annual Wellness  11/17/2024   BUN  11/17/2024   Zoster Vaccine  Completed   Influenza Vaccine  Completed   Meningococcal B Vaccine  Aged Out   Hepatitis A Vaccine  Aged Out   Meningococcal Conjugate Vaccine  Aged Out   Depression Screen Patient  Health Questionnaire    Falls Risk Screening  More data exists      11/18/2023  Falls Risk Assessment  Patient can ambulate Yes Yes  Do you feel unsteady when standing or walking? No No  Do you worry about falling? No No  Have you fallen in the past year? No No    Details       Multiple values from one day are sorted in reverse-chronological order        OPIOID RISK TOOL       * No data to display          Review of Systems  Constitutional: Negative for chills, fever and weight loss.  HENT: Negative for congestion and ear pain.   Eyes: Negative for double vision and discharge.  Respiratory: Negative for cough and shortness of breath.   Cardiovascular: Negative for chest pain and leg swelling.  Gastrointestinal: Negative for abdominal pain, blood in stool and stool changes  Genitourinary: Negative for dysuria and hematuria.  Musculoskeletal: Negative for myalgias.  Skin: Negative for rash.  Neurological: Negative  for speech change and headaches.  Endo/Heme/Allergies: Does not bruise/bleed easily.  Psychiatric/Behavioral: Negative for depression and memory loss.  All other systems reviewed and are negative. Objective   BP 116/66 (BP Location: Right Upper Arm, Patient Position: Sitting)   Pulse (!) 45   Temp 97.5 F (36.4 C) (Temporal)   Resp 14   Ht 5' 10.5 (1.791 m)   Wt 170 lb 12.8 oz (77.5 kg)   SpO2 99%   BMI 24.16 kg/m  Wt Readings from Last 3 Encounters:  06/01/24 170 lb 12.8 oz (77.5 kg)  11/18/23 185 lb (83.9 kg)  05/20/23 196 lb (88.9 kg)    General: The patient is a 88 y.o. male who appears to be in no acute distress. Psych: He is alert and oriented to person, place, and time. His mood and affect are normal. HEENT: Normocephalic, atraumatic, non-icteric sclera, PERRL.  Tympanic membranes are without perforation or infection.  Nasopharynx is grossly normal.  Oropharynx is without mass or exudate. Neck:  Supple.  Trachea is in midline position.  The neck is without adenopathy, masses, or thyromegaly.  No carotid bruits are noted. Lungs:  Good breath sounds are noted bilaterally.  The lungs are clear to auscultation and percussion bilaterally.  The spine and CVA region are  nontender to palpation. Cardio:  Regular rate and rhythm without gallop, rub, or murmur.  Abdomen:  Bowel sounds are physiologic.  The abdomen is soft and nontender to palpation.  No masses are noted.  No hepatosplenomegaly is noted.  Skin:  No rashes or worrisome lesions are noted.  Extremities:  The extremities are without cyanosis or significant contusions.  Pitting edema is not noted.  ROM is normal in all four extremities. Pulses: Adequate pulses are noted in all four extremities and both carotid arteries. Neuro:  Mental status is normal.  CN 2-11 are grossly normal.  Motor and sensory exams are grossly normal.  Gait is stable.     Alm FORBES Bilis, MD 06/01/2024, 3:34 PM             [1] Allergies Allergen Reactions   Restoril Other    BAD DREAMS  [2] Social History Socioeconomic History   Marital status: Widowed    Spouse name: GLENWOOD Flank - passed 2020   Number of children: 3  Occupational History   Occupation: Retired  Comptroller - worked on [employer]  Use   Smoking status: Never    Passive exposure: Past   Smokeless tobacco: Former  Building Services Engineer status: Never Used  Substance and Sexual Activity   Alcohol  use: Not Currently    Comment: everyday drink 2 to 4 glasses of scotch   Drug use: No  [3] Family History Problem Relation Name Age of Onset   Alcohol  abuse Father     Cancer Mother          breast  *Some images could not be shown.

## 2024-08-04 ENCOUNTER — Ambulatory Visit: Admitting: Podiatry
